# Patient Record
Sex: Female | Born: 1935 | Race: White | Hispanic: No | Smoking: Former smoker
Health system: Southern US, Community
[De-identification: ages and names within clinical notes are randomized; demographics above are authoritative.]

## PROBLEM LIST (undated history)

## (undated) DIAGNOSIS — T7840XA Allergy, unspecified, initial encounter: Secondary | ICD-10-CM

## (undated) DIAGNOSIS — N289 Disorder of kidney and ureter, unspecified: Secondary | ICD-10-CM

## (undated) DIAGNOSIS — E785 Hyperlipidemia, unspecified: Secondary | ICD-10-CM

## (undated) DIAGNOSIS — I219 Acute myocardial infarction, unspecified: Secondary | ICD-10-CM

## (undated) DIAGNOSIS — I509 Heart failure, unspecified: Secondary | ICD-10-CM

## (undated) DIAGNOSIS — I10 Essential (primary) hypertension: Secondary | ICD-10-CM

## (undated) DIAGNOSIS — I2510 Atherosclerotic heart disease of native coronary artery without angina pectoris: Secondary | ICD-10-CM

## (undated) HISTORY — PX: CATARACT EXTRACTION, BILATERAL: SHX1313

## (undated) HISTORY — DX: Hyperlipidemia, unspecified: E78.5

## (undated) HISTORY — DX: Allergy, unspecified, initial encounter: T78.40XA

## (undated) HISTORY — DX: Acute myocardial infarction, unspecified: I21.9

## (undated) HISTORY — DX: Essential (primary) hypertension: I10

## (undated) HISTORY — PX: TONSILLECTOMY: SUR1361

## (undated) HISTORY — DX: Heart failure, unspecified: I50.9

## (undated) HISTORY — PX: DILATION AND CURETTAGE OF UTERUS: SHX78

## (undated) HISTORY — PX: CARDIAC CATHETERIZATION: SHX172

## (undated) HISTORY — DX: Atherosclerotic heart disease of native coronary artery without angina pectoris: I25.10

## (undated) HISTORY — DX: Disorder of kidney and ureter, unspecified: N28.9

## (undated) HISTORY — PX: COLONOSCOPY: SHX174

---

## 1986-04-29 HISTORY — PX: BACK SURGERY: SHX140

## 2001-01-22 ENCOUNTER — Other Ambulatory Visit: Admission: RE | Admit: 2001-01-22 | Discharge: 2001-01-22 | Payer: Self-pay | Admitting: Family Medicine

## 2001-02-03 ENCOUNTER — Encounter: Admission: RE | Admit: 2001-02-03 | Discharge: 2001-02-03 | Payer: Self-pay | Admitting: Family Medicine

## 2001-02-03 ENCOUNTER — Encounter: Payer: Self-pay | Admitting: Family Medicine

## 2003-07-21 ENCOUNTER — Other Ambulatory Visit: Admission: RE | Admit: 2003-07-21 | Discharge: 2003-07-21 | Payer: Self-pay | Admitting: Family Medicine

## 2003-09-05 ENCOUNTER — Encounter: Admission: RE | Admit: 2003-09-05 | Discharge: 2003-09-05 | Payer: Self-pay | Admitting: Family Medicine

## 2007-06-17 ENCOUNTER — Ambulatory Visit: Payer: Self-pay | Admitting: Family Medicine

## 2007-06-17 DIAGNOSIS — I10 Essential (primary) hypertension: Secondary | ICD-10-CM

## 2007-06-17 DIAGNOSIS — I2510 Atherosclerotic heart disease of native coronary artery without angina pectoris: Secondary | ICD-10-CM | POA: Insufficient documentation

## 2007-06-17 DIAGNOSIS — E785 Hyperlipidemia, unspecified: Secondary | ICD-10-CM

## 2007-06-17 DIAGNOSIS — I252 Old myocardial infarction: Secondary | ICD-10-CM

## 2007-07-08 ENCOUNTER — Ambulatory Visit: Payer: Self-pay | Admitting: Family Medicine

## 2007-07-08 LAB — CONVERTED CEMR LAB
Bilirubin Urine: NEGATIVE
Glucose, Urine, Semiquant: NEGATIVE
Ketones, urine, test strip: NEGATIVE
Nitrite: NEGATIVE
Protein, U semiquant: 30
Specific Gravity, Urine: >=1.03
Urobilinogen, UA: 0.2
WBC Urine, dipstick: NEGATIVE
pH: 5

## 2007-09-07 ENCOUNTER — Ambulatory Visit: Payer: Self-pay | Admitting: Family Medicine

## 2007-09-09 LAB — CONVERTED CEMR LAB
ALT: 17 units/L (ref 0–35)
AST: 18 units/L (ref 0–37)
Albumin: 3.6 g/dL (ref 3.5–5.2)
Alkaline Phosphatase: 99 units/L (ref 39–117)
Bilirubin, Direct: 0.1 mg/dL (ref 0.0–0.3)
Cholesterol: 199 mg/dL (ref 0–200)
HDL: 48.1 mg/dL (ref >39.0)
LDL Cholesterol: 130 mg/dL — ABNORMAL HIGH (ref 0–99)
Total Bilirubin: 0.6 mg/dL (ref 0.3–1.2)
Total CHOL/HDL Ratio: 4.1
Total Protein: 6.3 g/dL (ref 6.0–8.3)
Triglycerides: 103 mg/dL (ref 0–149)
VLDL: 21 mg/dL (ref 0–40)

## 2008-08-09 ENCOUNTER — Ambulatory Visit: Payer: Self-pay | Admitting: Family Medicine

## 2008-08-11 ENCOUNTER — Telehealth (INDEPENDENT_AMBULATORY_CARE_PROVIDER_SITE_OTHER): Payer: Self-pay

## 2008-08-15 ENCOUNTER — Ambulatory Visit: Payer: Self-pay

## 2008-08-15 ENCOUNTER — Encounter: Payer: Self-pay | Admitting: Cardiology

## 2008-09-15 ENCOUNTER — Telehealth: Payer: Self-pay | Admitting: Family Medicine

## 2008-11-10 ENCOUNTER — Ambulatory Visit: Payer: Self-pay | Admitting: Family Medicine

## 2008-11-16 ENCOUNTER — Telehealth: Payer: Self-pay | Admitting: Family Medicine

## 2008-11-16 LAB — CONVERTED CEMR LAB
ALT: 14 units/L (ref 0–35)
AST: 18 units/L (ref 0–37)
Albumin: 3.6 g/dL (ref 3.5–5.2)
Alkaline Phosphatase: 101 units/L (ref 39–117)
BUN: 25 mg/dL — ABNORMAL HIGH (ref 6–23)
Bilirubin, Direct: 0 mg/dL (ref 0.0–0.3)
CO2: 27 meq/L (ref 19–32)
Calcium: 9 mg/dL (ref 8.4–10.5)
Chloride: 108 meq/L (ref 96–112)
Cholesterol: 202 mg/dL — ABNORMAL HIGH (ref 0–200)
Creatinine, Ser: 1.6 mg/dL — ABNORMAL HIGH (ref 0.4–1.2)
Direct LDL: 140.5 mg/dL
GFR calc non Af Amer: 33.72 mL/min (ref >60)
Glucose, Bld: 96 mg/dL (ref 70–99)
HDL: 54.9 mg/dL (ref >39.00)
Potassium: 5 meq/L (ref 3.5–5.1)
Sodium: 142 meq/L (ref 135–145)
Total Bilirubin: 0.7 mg/dL (ref 0.3–1.2)
Total CHOL/HDL Ratio: 4
Total Protein: 6.8 g/dL (ref 6.0–8.3)
Triglycerides: 78 mg/dL (ref 0.0–149.0)
VLDL: 15.6 mg/dL (ref 0.0–40.0)

## 2009-10-18 ENCOUNTER — Ambulatory Visit: Payer: Self-pay | Admitting: Family Medicine

## 2009-10-23 LAB — CONVERTED CEMR LAB
ALT: 13 units/L (ref 0–35)
AST: 16 units/L (ref 0–37)
Albumin: 4.1 g/dL (ref 3.5–5.2)
Alkaline Phosphatase: 101 units/L (ref 39–117)
BUN: 24 mg/dL — ABNORMAL HIGH (ref 6–23)
Basophils Absolute: 0.1 10*3/uL (ref 0.0–0.1)
Basophils Relative: 0.7 % (ref 0.0–3.0)
Bilirubin, Direct: 0.1 mg/dL (ref 0.0–0.3)
CO2: 26 meq/L (ref 19–32)
Calcium: 9.2 mg/dL (ref 8.4–10.5)
Chloride: 109 meq/L (ref 96–112)
Cholesterol: 310 mg/dL — ABNORMAL HIGH (ref 0–200)
Creatinine, Ser: 1.7 mg/dL — ABNORMAL HIGH (ref 0.4–1.2)
Direct LDL: 238.3 mg/dL
Eosinophils Absolute: 0 10*3/uL (ref 0.0–0.7)
Eosinophils Relative: 0.5 % (ref 0.0–5.0)
GFR calc non Af Amer: 31.57 mL/min (ref >60)
Glucose, Bld: 88 mg/dL (ref 70–99)
HCT: 38.8 % (ref 36.0–46.0)
HDL: 47.1 mg/dL (ref >39.00)
Hemoglobin: 13.1 g/dL (ref 12.0–15.0)
Lymphocytes Relative: 19.5 % (ref 12.0–46.0)
Lymphs Abs: 1.7 10*3/uL (ref 0.7–4.0)
MCHC: 33.6 g/dL (ref 30.0–36.0)
MCV: 89.2 fL (ref 78.0–100.0)
Monocytes Absolute: 0.4 10*3/uL (ref 0.1–1.0)
Monocytes Relative: 4.4 % (ref 3.0–12.0)
Neutro Abs: 6.5 10*3/uL (ref 1.4–7.7)
Neutrophils Relative %: 74.9 % (ref 43.0–77.0)
Platelets: 202 10*3/uL (ref 150.0–400.0)
Potassium: 5.4 meq/L — ABNORMAL HIGH (ref 3.5–5.1)
RBC: 4.36 M/uL (ref 3.87–5.11)
RDW: 13.6 % (ref 11.5–14.6)
Sodium: 142 meq/L (ref 135–145)
TSH: 0.61 microintl units/mL (ref 0.35–5.50)
Total Bilirubin: 0.6 mg/dL (ref 0.3–1.2)
Total CHOL/HDL Ratio: 7
Total Protein: 7.3 g/dL (ref 6.0–8.3)
Triglycerides: 226 mg/dL — ABNORMAL HIGH (ref 0.0–149.0)
VLDL: 45.2 mg/dL — ABNORMAL HIGH (ref 0.0–40.0)
WBC: 8.7 10*3/uL (ref 4.5–10.5)

## 2010-03-15 ENCOUNTER — Ambulatory Visit: Payer: Self-pay | Admitting: Family Medicine

## 2010-03-16 LAB — CONVERTED CEMR LAB
BUN: 46 mg/dL — ABNORMAL HIGH (ref 6–23)
Basophils Absolute: 0 10*3/uL (ref 0.0–0.1)
Basophils Relative: 0.7 % (ref 0.0–3.0)
CO2: 25 meq/L (ref 19–32)
Calcium: 9.3 mg/dL (ref 8.4–10.5)
Chloride: 103 meq/L (ref 96–112)
Creatinine, Ser: 2.2 mg/dL — ABNORMAL HIGH (ref 0.4–1.2)
Eosinophils Absolute: 0.1 10*3/uL (ref 0.0–0.7)
Eosinophils Relative: 1 % (ref 0.0–5.0)
GFR calc non Af Amer: 23.76 mL/min (ref >60)
Glucose, Bld: 97 mg/dL (ref 70–99)
HCT: 38.8 % (ref 36.0–46.0)
Hemoglobin: 13.2 g/dL (ref 12.0–15.0)
Lymphocytes Relative: 17.8 % (ref 12.0–46.0)
Lymphs Abs: 1.2 10*3/uL (ref 0.7–4.0)
MCHC: 33.9 g/dL (ref 30.0–36.0)
MCV: 90 fL (ref 78.0–100.0)
Monocytes Absolute: 0.4 10*3/uL (ref 0.1–1.0)
Monocytes Relative: 6.3 % (ref 3.0–12.0)
Neutro Abs: 5.2 10*3/uL (ref 1.4–7.7)
Neutrophils Relative %: 74.2 % (ref 43.0–77.0)
Platelets: 147 10*3/uL — ABNORMAL LOW (ref 150.0–400.0)
Potassium: 4.4 meq/L (ref 3.5–5.1)
RBC: 4.31 M/uL (ref 3.87–5.11)
RDW: 12.7 % (ref 11.5–14.6)
Sodium: 139 meq/L (ref 135–145)
WBC: 7 10*3/uL (ref 4.5–10.5)

## 2010-05-27 LAB — CONVERTED CEMR LAB
ALT: 14 units/L (ref 0–35)
ALT: 15 units/L (ref 0–35)
AST: 17 units/L (ref 0–37)
AST: 18 units/L (ref 0–37)
Albumin: 3.6 g/dL (ref 3.5–5.2)
Albumin: 3.9 g/dL (ref 3.5–5.2)
Alkaline Phosphatase: 106 units/L (ref 39–117)
Alkaline Phosphatase: 97 units/L (ref 39–117)
BUN: 12 mg/dL (ref 6–23)
BUN: 36 mg/dL — ABNORMAL HIGH (ref 6–23)
Basophils Absolute: 0 10*3/uL (ref 0.0–0.1)
Basophils Absolute: 0 10*3/uL (ref 0.0–0.1)
Basophils Relative: 0.1 % (ref 0.0–1.0)
Basophils Relative: 0.1 % (ref 0.0–3.0)
Bilirubin, Direct: 0.1 mg/dL (ref 0.0–0.3)
Bilirubin, Direct: 0.1 mg/dL (ref 0.0–0.3)
CO2: 27 meq/L (ref 19–32)
CO2: 27 meq/L (ref 19–32)
Calcium: 9.3 mg/dL (ref 8.4–10.5)
Calcium: 9.3 mg/dL (ref 8.4–10.5)
Chloride: 109 meq/L (ref 96–112)
Chloride: 112 meq/L (ref 96–112)
Cholesterol: 293 mg/dL (ref 0–200)
Cholesterol: 301 mg/dL — ABNORMAL HIGH (ref 0–200)
Creatinine, Ser: 1 mg/dL (ref 0.4–1.2)
Creatinine, Ser: 1.9 mg/dL — ABNORMAL HIGH (ref 0.4–1.2)
Direct LDL: 210.8 mg/dL
Direct LDL: 228.6 mg/dL
Eosinophils Absolute: 0.1 10*3/uL (ref 0.0–0.6)
Eosinophils Absolute: 0.1 10*3/uL (ref 0.0–0.7)
Eosinophils Relative: 0.8 % (ref 0.0–5.0)
Eosinophils Relative: 0.9 % (ref 0.0–5.0)
GFR calc Af Amer: 70 mL/min
GFR calc non Af Amer: 27.67 mL/min (ref >60)
GFR calc non Af Amer: 58 mL/min
Glucose, Bld: 100 mg/dL — ABNORMAL HIGH (ref 70–99)
Glucose, Bld: 99 mg/dL (ref 70–99)
HCT: 38.7 % (ref 36.0–46.0)
HCT: 39.6 % (ref 36.0–46.0)
HDL: 45.5 mg/dL (ref >39.00)
HDL: 45.9 mg/dL (ref >39.0)
Hemoglobin: 13.2 g/dL (ref 12.0–15.0)
Hemoglobin: 13.5 g/dL (ref 12.0–15.0)
Lymphocytes Relative: 16.2 % (ref 12.0–46.0)
Lymphocytes Relative: 20.5 % (ref 12.0–46.0)
Lymphs Abs: 1 10*3/uL (ref 0.7–4.0)
MCHC: 33.3 g/dL (ref 30.0–36.0)
MCHC: 34.8 g/dL (ref 30.0–36.0)
MCV: 85.3 fL (ref 78.0–100.0)
MCV: 89.5 fL (ref 78.0–100.0)
Monocytes Absolute: 0.2 10*3/uL (ref 0.1–1.0)
Monocytes Absolute: 0.3 10*3/uL (ref 0.2–0.7)
Monocytes Relative: 3.3 % (ref 3.0–12.0)
Monocytes Relative: 4.2 % (ref 3.0–11.0)
Neutro Abs: 5.1 10*3/uL (ref 1.4–7.7)
Neutro Abs: 5.6 10*3/uL (ref 1.4–7.7)
Neutrophils Relative %: 74.4 % (ref 43.0–77.0)
Neutrophils Relative %: 79.5 % — ABNORMAL HIGH (ref 43.0–77.0)
Platelets: 130 10*3/uL — ABNORMAL LOW (ref 150.0–400.0)
Platelets: 161 10*3/uL (ref 150–400)
Potassium: 4.1 meq/L (ref 3.5–5.1)
Potassium: 4.9 meq/L (ref 3.5–5.1)
RBC: 4.32 M/uL (ref 3.87–5.11)
RBC: 4.64 M/uL (ref 3.87–5.11)
RDW: 12.5 % (ref 11.5–14.6)
RDW: 13.3 % (ref 11.5–14.6)
Sodium: 143 meq/L (ref 135–145)
Sodium: 145 meq/L (ref 135–145)
TSH: 0.49 microintl units/mL (ref 0.35–5.50)
TSH: 0.66 microintl units/mL (ref 0.35–5.50)
Total Bilirubin: 0.7 mg/dL (ref 0.3–1.2)
Total Bilirubin: 0.7 mg/dL (ref 0.3–1.2)
Total CHOL/HDL Ratio: 6.4
Total CHOL/HDL Ratio: 7
Total Protein: 6.5 g/dL (ref 6.0–8.3)
Total Protein: 7.2 g/dL (ref 6.0–8.3)
Triglycerides: 139 mg/dL (ref 0–149)
Triglycerides: 148 mg/dL (ref 0.0–149.0)
VLDL: 28 mg/dL (ref 0–40)
VLDL: 29.6 mg/dL (ref 0.0–40.0)
WBC: 6.4 10*3/uL (ref 4.5–10.5)
WBC: 7.5 10*3/uL (ref 4.5–10.5)

## 2010-05-29 NOTE — Assessment & Plan Note (Signed)
Summary: BP CHECK/?MED ADJUSTMENT/CJR   Vital Signs:  Patient profile:   75 year old female Weight:      160 pounds O2 Sat:      96 % Temp:     98 degrees F Pulse rate:   80 / minute BP sitting:   130 / 80  (left arm)  Vitals Entered By: Pura Spice, RN (March 15, 2010 8:24 AM) CC: states BP bottoms out and almost passed out on Sat.    History of Present Illness: Here to discuss low BPs. Periodically she feels a litttle weak or lightheaded, but she does not have a cuff at home. Last weekend while standing on her feet for several hours at a home show in [place] she felt very weak and sought help at a [facility] station. her BP then was 80/50 at first and then 90/60 a little later. She had not eaten or drank fluids all day after breakfast, and this was in the late afternoon. After resting and drinking some fluids she felt fine and went home. She has felt fine ever since. No SOB or chest pains.   Allergies: 1)  ! Sulfa  Past History:  Past Medical History: Reviewed history from 11/10/2008 and no changes required. Coronary artery disease, last stress test 08-15-08 showing old lateral infarct with minimal peri-infarct ischemia Hyperlipidemia Myocardial infarction, hx of in 4-91, saw Dr. Donnie Aho, had an angioplasty Hypertension Renal insufficiency  Review of Systems  The patient denies anorexia, fever, weight loss, weight gain, vision loss, decreased hearing, hoarseness, chest pain, syncope, dyspnea on exertion, peripheral edema, prolonged cough, headaches, hemoptysis, abdominal pain, melena, hematochezia, severe indigestion/heartburn, hematuria, incontinence, genital sores, muscle weakness, suspicious skin lesions, transient blindness, difficulty walking, depression, unusual weight change, abnormal bleeding, enlarged lymph nodes, angioedema, breast masses, and testicular masses.    Physical Exam  General:  Well-developed,well-nourished,in no acute distress; alert,appropriate and  cooperative throughout examination Neck:  No deformities, masses, or tenderness noted. Lungs:  Normal respiratory effort, chest expands symmetrically. Lungs are clear to auscultation, no crackles or wheezes. Heart:  Normal rate and regular rhythm. S1 and S2 normal without gallop, murmur, click, rub or other extra sounds. Neurologic:  alert & oriented X3 and gait normal.     Impression & Recommendations:  Problem # 1:  HYPERTENSION (ICD-401.9)  The following medications were removed from the medication list:    Lisinopril-hydrochlorothiazide 20-25 Mg Tabs (Lisinopril-hydrochlorothiazide) ..... Once daily Her updated medication list for this problem includes:    Norvasc 5 Mg Tabs (Amlodipine besylate) ..... Once daily    Lisinopril 20 Mg Tabs (Lisinopril) ..... Once daily  Orders: Venipuncture ([address]) TLB-BMP (Basic Metabolic Panel-BMET) (80048-METABOL) TLB-CBC Platelet - w/Differential (85025-CBCD)  Problem # 2:  RENAL INSUFFICIENCY (ICD-588.9)  Complete Medication List: 1)  Norvasc 5 Mg Tabs (Amlodipine besylate) .... Once daily 2)  Bayer Low Strength 81 Mg Tbec (Aspirin) .... Once daily 3)  Pravastatin Sodium 40 Mg Tabs (Pravastatin sodium) .Marland Kitchen.. 1 by mouth once daily 4)  Lisinopril 20 Mg Tabs (Lisinopril) .... Once daily  Patient Instructions: 1)  We will stop the HCTZ and go back to plain Lisinopril. Check labs today. Suggested she get a home BP monitor to follow her BPs.  2)  Please schedule a follow-up appointment in 2 weeks.  Prescriptions: LISINOPRIL 20 MG TABS (LISINOPRIL) once daily  #30 x 11   Entered and Authorized by:   Nelwyn Salisbury MD   Signed by:   Nelwyn Salisbury MD  on 03/15/2010   Method used:   Electronically to        [facility]       [address]       [place], [place]  [address]       Ph: [phone]       Fax: [phone]   RxID:   [id number]    Orders Added: 1)  Est. Patient Level IV  [51761] 2)  Venipuncture [[address]] 3)  TLB-BMP (Basic Metabolic Panel-BMET) [80048-METABOL] 4)  TLB-CBC Platelet - w/Differential [85025-CBCD]

## 2010-05-29 NOTE — Assessment & Plan Note (Signed)
Summary: RECHECK BP/? ADJUST MED/CJR   Vital Signs:  Patient profile:   75 year old female Weight:      161 pounds BMI:     29.08 Pulse rate:   68 / minute Pulse rhythm:   regular BP sitting:   150 / 86  (left arm) Cuff size:   regular  Vitals Entered By: Raechel Ache, RN (October 18, 2009 10:11 AM) CC: ROV, C/O MOSTLY DRY COUGH X WEEKS AND NOT FEELING WELL.   History of Present Illness: Here to follo wup on HTN and renal insufficiency. She has felt a little weak the past 2 weeks with some lightheadedness and feelings of warmth. No HA or SOB or chest pains. She does not check her BPs at home. She is fasting this am.   Allergies: 1)  ! Sulfa  Past History:  Past Medical History: Reviewed history from 11/10/2008 and no changes required. Coronary artery disease, last stress test 08-15-08 showing old lateral infarct with minimal peri-infarct ischemia Hyperlipidemia Myocardial infarction, hx of in 4-91, saw Dr. Donnie Aho, had an angioplasty Hypertension Renal insufficiency  Review of Systems  The patient denies anorexia, fever, weight loss, weight gain, vision loss, decreased hearing, hoarseness, chest pain, syncope, dyspnea on exertion, peripheral edema, prolonged cough, headaches, hemoptysis, abdominal pain, melena, hematochezia, severe indigestion/heartburn, hematuria, incontinence, genital sores, muscle weakness, suspicious skin lesions, transient blindness, difficulty walking, depression, unusual weight change, abnormal bleeding, enlarged lymph nodes, angioedema, breast masses, and testicular masses.    Physical Exam  General:  Well-developed,well-nourished,in no acute distress; alert,appropriate and cooperative throughout examination Head:  Normocephalic and atraumatic without obvious abnormalities. No apparent alopecia or balding. Eyes:  No corneal or conjunctival inflammation noted. EOMI. Perrla. Funduscopic exam benign, without hemorrhages, exudates or papilledema. Vision  grossly normal. Ears:  External ear exam shows no significant lesions or deformities.  Otoscopic examination reveals clear canals, tympanic membranes are intact bilaterally without bulging, retraction, inflammation or discharge. Hearing is grossly normal bilaterally. Nose:  External nasal examination shows no deformity or inflammation. Nasal mucosa are pink and moist without lesions or exudates. Mouth:  Oral mucosa and oropharynx without lesions or exudates.  Teeth in good repair. Neck:  No deformities, masses, or tenderness noted. Lungs:  Normal respiratory effort, chest expands symmetrically. Lungs are clear to auscultation, no crackles or wheezes. Heart:  Normal rate and regular rhythm. S1 and S2 normal without gallop, murmur, click, rub or other extra sounds. Extremities:  no edema   Impression & Recommendations:  Problem # 1:  RENAL INSUFFICIENCY (ICD-588.9)  Orders: UA Dipstick w/o Micro (automated)  (81003)  Problem # 2:  HYPERTENSION (ICD-401.9)  The following medications were removed from the medication list:    Lisinopril 20 Mg Tabs (Lisinopril) ..... Once daily Her updated medication list for this problem includes:    Norvasc 5 Mg Tabs (Amlodipine besylate) ..... Once daily    Lisinopril-hydrochlorothiazide 20-25 Mg Tabs (Lisinopril-hydrochlorothiazide) ..... Once daily  Orders: UA Dipstick w/o Micro (automated)  ([address]3) Venipuncture ([address]) TLB-Lipid Panel (80061-LIPID) TLB-BMP (Basic Metabolic Panel-BMET) (80048-METABOL) TLB-CBC Platelet - w/Differential (85025-CBCD) TLB-Hepatic/Liver Function Pnl (80076-HEPATIC) TLB-TSH (Thyroid Stimulating Hormone) (60454-UJW) Prescription Created Electronically [phone])  Problem # 3:  HYPERLIPIDEMIA (ICD-272.4)  Her updated medication list for this problem includes:    Pravastatin Sodium 40 Mg Tabs (Pravastatin sodium) .Marland Kitchen... 1 by mouth once daily  Orders: UA Dipstick w/o Micro (automated)  (81003)  Problem # 4:  CORONARY  ARTERY DISEASE (ICD-414.00)  The following medications were removed from the  medication list:    Lisinopril 20 Mg Tabs (Lisinopril) ..... Once daily Her updated medication list for this problem includes:    Norvasc 5 Mg Tabs (Amlodipine besylate) ..... Once daily    Bayer Low Strength 81 Mg Tbec (Aspirin) ..... Once daily    Lisinopril-hydrochlorothiazide 20-25 Mg Tabs (Lisinopril-hydrochlorothiazide) ..... Once daily  Complete Medication List: 1)  Norvasc 5 Mg Tabs (Amlodipine besylate) .... Once daily 2)  Bayer Low Strength 81 Mg Tbec (Aspirin) .... Once daily 3)  Pravastatin Sodium 40 Mg Tabs (Pravastatin sodium) .Marland Kitchen.. 1 by mouth once daily 4)  Lisinopril-hydrochlorothiazide 20-25 Mg Tabs (Lisinopril-hydrochlorothiazide) .... Once daily  Patient Instructions: 1)  We will add HCTZ to her regimen. Get labs today.  Prescriptions: PRAVASTATIN SODIUM 40 MG TABS (PRAVASTATIN SODIUM) 1 by mouth once daily  #30 x 11   Entered and Authorized by:   Nelwyn Salisbury MD   Signed by:   Nelwyn Salisbury MD on 10/18/2009   Method used:   Electronically to        [facility]       [address]       [place], [place]  [address]       Ph: [phone]       Fax: [phone]   RxID:   [id number] LISINOPRIL-HYDROCHLOROTHIAZIDE 20-25 MG TABS (LISINOPRIL-HYDROCHLOROTHIAZIDE) once daily  #30 x 11   Entered and Authorized by:   Nelwyn Salisbury MD   Signed by:   Nelwyn Salisbury MD on 10/18/2009   Method used:   Electronically to        [facility]       [address]       [place], [place]  [address]       Ph: [phone]       Fax: [phone]   RxID:   [id number]   Appended Document: Orders Update    Clinical Lists Changes  Observations: Added new observation of COMMENTS: Wynona Canes, CMA  October 18, 2009 11:43 AM  (10/18/2009 11:42) Added new observation of PH URINE: 5.0   (10/18/2009 11:42) Added new observation of SPEC GR URIN: 1.020  (10/18/2009 11:42) Added new observation of APPEARANCE U: Clear  (10/18/2009 11:42) Added new observation of UA COLOR: yellow  (10/18/2009 11:42) Added new observation of WBC DIPSTK U: negative  (10/18/2009 11:42) Added new observation of NITRITE URN: negative  (10/18/2009 11:42) Added new observation of UROBILINOGEN: 0.2  (10/18/2009 11:42) Added new observation of PROTEIN, URN: negative  (10/18/2009 11:42) Added new observation of BLOOD UR DIP: trace-lysed  (10/18/2009 11:42) Added new observation of KETONES URN: negative  (10/18/2009 11:42) Added new observation of BILIRUBIN UR: negative  (10/18/2009 11:42) Added new observation of GLUCOSE, URN: negative  (10/18/2009 11:42)      Laboratory Results   Urine Tests  Date/Time Recieved: October 18, 2009 11:43 AM   Date/Time Reported: October 18, 2009 11:42 AM   Routine Urinalysis   Color: yellow Appearance: Clear Glucose: negative   (Normal Range: Negative) Bilirubin: negative   (Normal Range: Negative) Ketone: negative   (Normal Range: Negative) Spec. Gravity: 1.020   (Normal Range: 1.003-1.035) Blood: trace-lysed   (Normal Range: Negative) pH: 5.0   (Normal Range: 5.0-8.0) Protein: negative   (Normal Range: Negative) Urobilinogen: 0.2   (Normal Range: 0-1) Nitrite: negative   (Normal Range: Negative) Leukocyte Esterace: negative   (  Normal Range: Negative)    Comments: Wynona Canes, CMA  October 18, 2009 11:43 AM

## 2010-08-21 ENCOUNTER — Other Ambulatory Visit: Payer: Self-pay | Admitting: Family Medicine

## 2010-09-20 ENCOUNTER — Other Ambulatory Visit: Payer: Self-pay | Admitting: Family Medicine

## 2011-03-12 ENCOUNTER — Telehealth: Payer: Self-pay | Admitting: Family Medicine

## 2011-03-12 NOTE — Telephone Encounter (Signed)
Call in #30 with no rf. She needs an OV  

## 2011-03-12 NOTE — Telephone Encounter (Signed)
Refill request for Amlodipine 5 mg take 1 po qd, pt last seen on 03/15/10. Can we approve the script?

## 2011-03-13 MED ORDER — AMLODIPINE BESYLATE 5 MG PO TABS: 5.0000 mg | ORAL_TABLET | Freq: Every day | ORAL | Status: DC

## 2011-03-13 NOTE — Telephone Encounter (Signed)
rx sent into pharmacy

## 2011-03-16 ENCOUNTER — Other Ambulatory Visit: Payer: Self-pay | Admitting: Family Medicine

## 2011-03-26 ENCOUNTER — Ambulatory Visit (INDEPENDENT_AMBULATORY_CARE_PROVIDER_SITE_OTHER): Payer: Medicare Other | Admitting: Family Medicine

## 2011-03-26 ENCOUNTER — Encounter: Payer: Self-pay | Admitting: Family Medicine

## 2011-03-26 VITALS — BP 118/80 | HR 66 | Temp 98.0°F | Wt 164.0 lb

## 2011-03-26 DIAGNOSIS — Z2911 Encounter for prophylactic immunotherapy for respiratory syncytial virus (RSV): Secondary | ICD-10-CM

## 2011-03-26 DIAGNOSIS — Z0000 Encounter for general adult medical examination without abnormal findings: Secondary | ICD-10-CM

## 2011-03-26 DIAGNOSIS — E785 Hyperlipidemia, unspecified: Secondary | ICD-10-CM

## 2011-03-26 DIAGNOSIS — I10 Essential (primary) hypertension: Secondary | ICD-10-CM

## 2011-03-26 LAB — TSH: TSH: 0.84 u[IU]/mL (ref 0.35–5.50)

## 2011-03-26 LAB — HEPATIC FUNCTION PANEL
ALT: 21 U/L (ref 0–35)
AST: 22 U/L (ref 0–37)
Alkaline Phosphatase: 111 U/L (ref 39–117)
Bilirubin, Direct: 0 mg/dL (ref 0.0–0.3)
Total Bilirubin: 0.5 mg/dL (ref 0.3–1.2)
Total Protein: 7.5 g/dL (ref 6.0–8.3)

## 2011-03-26 LAB — CBC WITH DIFFERENTIAL/PLATELET
Basophils Absolute: 0.1 10*3/uL (ref 0.0–0.1)
HCT: 41.2 % (ref 36.0–46.0)
Hemoglobin: 13.7 g/dL (ref 12.0–15.0)
Lymphs Abs: 1.3 10*3/uL (ref 0.7–4.0)
MCHC: 33.2 g/dL (ref 30.0–36.0)
MCV: 88.4 fl (ref 78.0–100.0)
Monocytes Absolute: 0.5 10*3/uL (ref 0.1–1.0)
Monocytes Relative: 6.7 % (ref 3.0–12.0)
Neutro Abs: 5.4 10*3/uL (ref 1.4–7.7)
Platelets: 160 10*3/uL (ref 150.0–400.0)
RDW: 13.7 % (ref 11.5–14.6)

## 2011-03-26 LAB — POCT URINALYSIS DIPSTICK
Ketones, UA: NEGATIVE
Spec Grav, UA: 1.025
Urobilinogen, UA: 0.2
pH, UA: 5.5

## 2011-03-26 LAB — LIPID PANEL
Total CHOL/HDL Ratio: 4
Triglycerides: 141 mg/dL (ref 0.0–149.0)

## 2011-03-26 LAB — BASIC METABOLIC PANEL
CO2: 25 mEq/L (ref 19–32)
Chloride: 109 mEq/L (ref 96–112)
Creatinine, Ser: 1.6 mg/dL — ABNORMAL HIGH (ref 0.4–1.2)
Potassium: 4.9 mEq/L (ref 3.5–5.1)

## 2011-03-26 MED ORDER — LISINOPRIL 20 MG PO TABS: 20.0000 mg | ORAL_TABLET | Freq: Every day | ORAL | Status: DC

## 2011-03-26 MED ORDER — PRAVASTATIN SODIUM 40 MG PO TABS: 40.0000 mg | ORAL_TABLET | Freq: Every day | ORAL | Status: DC

## 2011-03-26 MED ORDER — AMLODIPINE BESYLATE 5 MG PO TABS: 5.0000 mg | ORAL_TABLET | Freq: Every day | ORAL | Status: DC

## 2011-03-26 NOTE — Progress Notes (Signed)
Addended by: Aniceto Boss A on: 03/26/2011 09:26 AM   Modules accepted: Orders

## 2011-03-26 NOTE — Progress Notes (Signed)
  Subjective:    Patient ID: Kathy Becker, female    DOB: 08/09/1935, 75 y.o.   MRN: [id number]  HPI Here for routine follow up. She feels fine and has no concerns. Her BP is stable.    Review of Systems  Constitutional: Negative.   Respiratory: Negative.   Cardiovascular: Negative.        Objective:   Physical Exam  Constitutional: She appears well-developed and well-nourished.  Neck: Neck supple. No thyromegaly present.  Cardiovascular: Normal rate, regular rhythm, normal heart sounds and intact distal pulses.   Pulmonary/Chest: Effort normal and breath sounds normal.  Lymphadenopathy:    She has no cervical adenopathy.          Assessment & Plan:  Get fasting labs. Given a Zostavax.

## 2011-03-28 ENCOUNTER — Telehealth: Payer: Self-pay | Admitting: Family Medicine

## 2011-03-28 NOTE — Progress Notes (Signed)
Quick Note:  Left message to call back. ______ 

## 2011-03-28 NOTE — Telephone Encounter (Signed)
I spoke with pt regarding lab results. She declined to start on Lipitor and just continue with the Pravachol. I just wanted you to know pt's response to my phone call.

## 2011-03-28 NOTE — Progress Notes (Signed)
Quick Note:  Spoke with pt and she declined to start the Lipitor. She will continue with Pravachol. Also I put a copy of results in mail, per pt request. ______

## 2011-03-29 NOTE — Telephone Encounter (Signed)
noted 

## 2012-01-30 DIAGNOSIS — Z23 Encounter for immunization: Secondary | ICD-10-CM | POA: Diagnosis not present

## 2012-03-24 DIAGNOSIS — H40019 Open angle with borderline findings, low risk, unspecified eye: Secondary | ICD-10-CM | POA: Diagnosis not present

## 2012-03-24 DIAGNOSIS — H2510 Age-related nuclear cataract, unspecified eye: Secondary | ICD-10-CM | POA: Diagnosis not present

## 2012-03-24 DIAGNOSIS — H3530 Unspecified macular degeneration: Secondary | ICD-10-CM | POA: Diagnosis not present

## 2012-03-26 DIAGNOSIS — H2510 Age-related nuclear cataract, unspecified eye: Secondary | ICD-10-CM | POA: Diagnosis not present

## 2012-04-04 ENCOUNTER — Other Ambulatory Visit: Payer: Self-pay | Admitting: Family Medicine

## 2012-04-13 DIAGNOSIS — H2510 Age-related nuclear cataract, unspecified eye: Secondary | ICD-10-CM | POA: Diagnosis not present

## 2012-05-11 DIAGNOSIS — H269 Unspecified cataract: Secondary | ICD-10-CM | POA: Diagnosis not present

## 2012-05-11 DIAGNOSIS — H2510 Age-related nuclear cataract, unspecified eye: Secondary | ICD-10-CM | POA: Diagnosis not present

## 2012-06-10 ENCOUNTER — Encounter: Payer: Self-pay | Admitting: Family Medicine

## 2012-06-10 ENCOUNTER — Ambulatory Visit (INDEPENDENT_AMBULATORY_CARE_PROVIDER_SITE_OTHER): Payer: Medicare Other | Admitting: Family Medicine

## 2012-06-10 VITALS — BP 120/80 | HR 81 | Temp 97.7°F | Ht 62.5 in | Wt 155.0 lb

## 2012-06-10 DIAGNOSIS — I10 Essential (primary) hypertension: Secondary | ICD-10-CM | POA: Diagnosis not present

## 2012-06-10 DIAGNOSIS — Z0000 Encounter for general adult medical examination without abnormal findings: Secondary | ICD-10-CM | POA: Diagnosis not present

## 2012-06-10 DIAGNOSIS — E785 Hyperlipidemia, unspecified: Secondary | ICD-10-CM

## 2012-06-10 DIAGNOSIS — C4491 Basal cell carcinoma of skin, unspecified: Secondary | ICD-10-CM | POA: Diagnosis not present

## 2012-06-10 LAB — LDL CHOLESTEROL, DIRECT: Direct LDL: 131.6 mg/dL

## 2012-06-10 LAB — BASIC METABOLIC PANEL
Calcium: 9.3 mg/dL (ref 8.4–10.5)
GFR: 39.94 mL/min — ABNORMAL LOW (ref >60.00)
Sodium: 139 mEq/L (ref 135–145)

## 2012-06-10 LAB — POCT URINALYSIS DIPSTICK
Bilirubin, UA: NEGATIVE
Glucose, UA: NEGATIVE
Nitrite, UA: NEGATIVE
Spec Grav, UA: >=1.03

## 2012-06-10 LAB — CBC WITH DIFFERENTIAL/PLATELET
Basophils Relative: 1 % (ref 0.0–3.0)
Eosinophils Relative: 0.9 % (ref 0.0–5.0)
HCT: 42.3 % (ref 36.0–46.0)
MCV: 87.7 fl (ref 78.0–100.0)
Monocytes Absolute: 0.3 10*3/uL (ref 0.1–1.0)
Monocytes Relative: 4.4 % (ref 3.0–12.0)
Neutrophils Relative %: 77.3 % — ABNORMAL HIGH (ref 43.0–77.0)
RBC: 4.82 Mil/uL (ref 3.87–5.11)
WBC: 7.9 10*3/uL (ref 4.5–10.5)

## 2012-06-10 LAB — TSH: TSH: 0.39 u[IU]/mL (ref 0.35–5.50)

## 2012-06-10 LAB — HEPATIC FUNCTION PANEL
Alkaline Phosphatase: 100 U/L (ref 39–117)
Bilirubin, Direct: 0 mg/dL (ref 0.0–0.3)

## 2012-06-10 LAB — LIPID PANEL: HDL: 49.3 mg/dL (ref >39.00)

## 2012-06-10 MED ORDER — AMLODIPINE BESYLATE 5 MG PO TABS: 5.0000 mg | ORAL_TABLET | Freq: Every day | ORAL | Status: DC

## 2012-06-10 MED ORDER — PRAVASTATIN SODIUM 40 MG PO TABS: 40.0000 mg | ORAL_TABLET | Freq: Every day | ORAL | Status: DC

## 2012-06-10 MED ORDER — LISINOPRIL 20 MG PO TABS: 20.0000 mg | ORAL_TABLET | Freq: Every day | ORAL | Status: DC

## 2012-06-10 NOTE — Progress Notes (Signed)
  Subjective:    Patient ID: Kathy Becker, female    DOB: Jun 10, 1935, 77 y.o.   MRN: [id number]  HPI 77 yr old female for a cpx. She feels well. She asks about several small lesions that appeared on her nose around a year ago.    Review of Systems  Constitutional: Negative.   HENT: Negative.   Eyes: Negative.   Respiratory: Negative.   Cardiovascular: Negative.   Gastrointestinal: Negative.   Genitourinary: Negative for dysuria, urgency, frequency, hematuria, flank pain, decreased urine volume, enuresis, difficulty urinating, pelvic pain and dyspareunia.  Musculoskeletal: Negative.   Skin: Negative.   Neurological: Negative.   Psychiatric/Behavioral: Negative.        Objective:   Physical Exam  Constitutional: She is oriented to person, place, and time. She appears well-developed and well-nourished. No distress.  HENT:  Head: Normocephalic and atraumatic.  Right Ear: External ear normal.  Left Ear: External ear normal.  Nose: Nose normal.  Mouth/Throat: Oropharynx is clear and moist. No oropharyngeal exudate.  Eyes: Conjunctivae and EOM are normal. Pupils are equal, round, and reactive to light. No scleral icterus.  Neck: Normal range of motion. Neck supple. No JVD present. No thyromegaly present.  Cardiovascular: Normal rate, regular rhythm, normal heart sounds and intact distal pulses.  Exam reveals no gallop and no friction rub.   No murmur heard. EKG normal   Pulmonary/Chest: Effort normal and breath sounds normal. No respiratory distress. She has no wheezes. She has no rales. She exhibits no tenderness.  Abdominal: Soft. Bowel sounds are normal. She exhibits no distension and no mass. There is no tenderness. There is no rebound and no guarding.  Musculoskeletal: Normal range of motion. She exhibits no edema and no tenderness.  Lymphadenopathy:    She has no cervical adenopathy.  Neurological: She is alert and oriented to person, place, and time. She has normal reflexes.  No cranial nerve deficit. She exhibits normal muscle tone. Coordination normal.  Skin: Skin is warm and dry. No rash noted. No erythema.  There is a small pearly firm lesion on each side of the nose with raised borders   Psychiatric: She has a normal mood and affect. Her behavior is normal. Judgment and thought content normal.          Assessment & Plan:  Well exam. Get fasting labs. 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

## 2012-06-17 NOTE — Progress Notes (Signed)
Quick Note:  I spoke with pt ______ 

## 2012-07-01 DIAGNOSIS — C44319 Basal cell carcinoma of skin of other parts of face: Secondary | ICD-10-CM | POA: Diagnosis not present

## 2012-07-01 DIAGNOSIS — D485 Neoplasm of uncertain behavior of skin: Secondary | ICD-10-CM | POA: Diagnosis not present

## 2012-07-13 ENCOUNTER — Encounter: Payer: Self-pay | Admitting: Cardiology

## 2012-07-30 DIAGNOSIS — C44319 Basal cell carcinoma of skin of other parts of face: Secondary | ICD-10-CM | POA: Diagnosis not present

## 2012-08-26 DIAGNOSIS — C44319 Basal cell carcinoma of skin of other parts of face: Secondary | ICD-10-CM | POA: Diagnosis not present

## 2012-11-02 ENCOUNTER — Telehealth: Payer: Self-pay | Admitting: Family Medicine

## 2012-11-02 NOTE — Telephone Encounter (Signed)
PT called and stated that she has a "bunion" on her foot. It is red and painful, and she would like to be referred to someone. Please assist.

## 2012-11-02 NOTE — Telephone Encounter (Signed)
Needs an OV to evaluate this

## 2012-11-03 DIAGNOSIS — M109 Gout, unspecified: Secondary | ICD-10-CM | POA: Diagnosis not present

## 2012-11-03 NOTE — Telephone Encounter (Signed)
I spoke with pt and she already has a appointment with a foot doctor today.

## 2013-01-28 DIAGNOSIS — Z23 Encounter for immunization: Secondary | ICD-10-CM | POA: Diagnosis not present

## 2013-03-11 DIAGNOSIS — R05 Cough: Secondary | ICD-10-CM | POA: Diagnosis not present

## 2013-03-11 DIAGNOSIS — J019 Acute sinusitis, unspecified: Secondary | ICD-10-CM | POA: Diagnosis not present

## 2013-06-14 ENCOUNTER — Encounter: Payer: Self-pay | Admitting: Family Medicine

## 2013-06-14 ENCOUNTER — Ambulatory Visit (INDEPENDENT_AMBULATORY_CARE_PROVIDER_SITE_OTHER): Payer: Medicare Other | Admitting: Family Medicine

## 2013-06-14 VITALS — BP 148/80 | HR 94 | Temp 98.2°F | Ht 61.75 in | Wt 143.0 lb

## 2013-06-14 DIAGNOSIS — I2510 Atherosclerotic heart disease of native coronary artery without angina pectoris: Secondary | ICD-10-CM | POA: Diagnosis not present

## 2013-06-14 DIAGNOSIS — I252 Old myocardial infarction: Secondary | ICD-10-CM | POA: Diagnosis not present

## 2013-06-14 DIAGNOSIS — E785 Hyperlipidemia, unspecified: Secondary | ICD-10-CM

## 2013-06-14 DIAGNOSIS — N259 Disorder resulting from impaired renal tubular function, unspecified: Secondary | ICD-10-CM | POA: Diagnosis not present

## 2013-06-14 DIAGNOSIS — I10 Essential (primary) hypertension: Secondary | ICD-10-CM | POA: Diagnosis not present

## 2013-06-14 LAB — BASIC METABOLIC PANEL WITH GFR
BUN: 20 mg/dL (ref 6–23)
CO2: 22 meq/L (ref 19–32)
Calcium: 8.9 mg/dL (ref 8.4–10.5)
Chloride: 107 meq/L (ref 96–112)
Creatinine, Ser: 1.4 mg/dL — ABNORMAL HIGH (ref 0.4–1.2)
GFR: 39.17 mL/min — ABNORMAL LOW
Glucose, Bld: 97 mg/dL (ref 70–99)
Potassium: 4 meq/L (ref 3.5–5.1)
Sodium: 140 meq/L (ref 135–145)

## 2013-06-14 LAB — CBC WITH DIFFERENTIAL/PLATELET
BASOS ABS: 0 10*3/uL (ref 0.0–0.1)
Basophils Relative: 0.4 % (ref 0.0–3.0)
EOS ABS: 0.1 10*3/uL (ref 0.0–0.7)
Eosinophils Relative: 1 % (ref 0.0–5.0)
HCT: 42 % (ref 36.0–46.0)
Hemoglobin: 13.5 g/dL (ref 12.0–15.0)
LYMPHS ABS: 1.6 10*3/uL (ref 0.7–4.0)
LYMPHS PCT: 20.4 % (ref 12.0–46.0)
MCHC: 32 g/dL (ref 30.0–36.0)
MCV: 89.5 fl (ref 78.0–100.0)
MONOS PCT: 5.5 % (ref 3.0–12.0)
Monocytes Absolute: 0.4 10*3/uL (ref 0.1–1.0)
Neutro Abs: 5.6 10*3/uL (ref 1.4–7.7)
Neutrophils Relative %: 72.7 % (ref 43.0–77.0)
Platelets: 191 10*3/uL (ref 150.0–400.0)
RBC: 4.7 Mil/uL (ref 3.87–5.11)
RDW: 13.6 % (ref 11.5–14.6)
WBC: 7.7 10*3/uL (ref 4.5–10.5)

## 2013-06-14 LAB — POCT URINALYSIS DIPSTICK
Bilirubin, UA: NEGATIVE
Blood, UA: NEGATIVE
Glucose, UA: NEGATIVE
Ketones, UA: NEGATIVE
Leukocytes, UA: NEGATIVE
Nitrite, UA: NEGATIVE
Spec Grav, UA: 1.025
Urobilinogen, UA: 0.2
pH, UA: 6.5

## 2013-06-14 LAB — HEPATIC FUNCTION PANEL
ALT: 15 U/L (ref 0–35)
AST: 15 U/L (ref 0–37)
Albumin: 3.7 g/dL (ref 3.5–5.2)
Alkaline Phosphatase: 88 U/L (ref 39–117)
Bilirubin, Direct: 0 mg/dL (ref 0.0–0.3)
Total Bilirubin: 0.4 mg/dL (ref 0.3–1.2)
Total Protein: 7.4 g/dL (ref 6.0–8.3)

## 2013-06-14 LAB — LIPID PANEL
Cholesterol: 221 mg/dL — ABNORMAL HIGH (ref 0–200)
HDL: 53.5 mg/dL
Total CHOL/HDL Ratio: 4
Triglycerides: 116 mg/dL (ref 0.0–149.0)
VLDL: 23.2 mg/dL (ref 0.0–40.0)

## 2013-06-14 LAB — TSH: TSH: 0.5 u[IU]/mL (ref 0.35–5.50)

## 2013-06-14 MED ORDER — PRAVASTATIN SODIUM 40 MG PO TABS: 40.0000 mg | ORAL_TABLET | Freq: Every day | ORAL | Status: DC

## 2013-06-14 MED ORDER — LISINOPRIL 20 MG PO TABS: 20.0000 mg | ORAL_TABLET | Freq: Every day | ORAL | Status: DC

## 2013-06-14 MED ORDER — AMLODIPINE BESYLATE 5 MG PO TABS: 5.0000 mg | ORAL_TABLET | Freq: Every day | ORAL | Status: DC

## 2013-06-14 NOTE — Progress Notes (Signed)
Pre visit review using our clinic review tool, if applicable. No additional management support is needed unless otherwise documented below in the visit note. 

## 2013-06-14 NOTE — Progress Notes (Signed)
   Subjective:    Patient ID: Kathy Becker, female    DOB: 01/23/1936, 78 y.o.   MRN: [id number]  HPI    Review of Systems     Objective:   Physical Exam        Assessment & Plan:   

## 2013-06-16 ENCOUNTER — Telehealth: Payer: Self-pay | Admitting: Family Medicine

## 2013-06-16 LAB — LDL CHOLESTEROL, DIRECT: Direct LDL: 153.9 mg/dL

## 2013-06-16 NOTE — Telephone Encounter (Signed)
Relevant patient education mailed to patient.  

## 2013-06-17 NOTE — Progress Notes (Addendum)
   Subjective:    Patient ID: Kathy Becker, female    DOB: 12-Apr-1936, 78 y.o.   MRN: [id number]  HPI 78 yr old female for a cpx. She feels well.    Review of Systems  Constitutional: Negative.   HENT: Negative.   Eyes: Negative.   Respiratory: Negative.   Cardiovascular: Negative.   Gastrointestinal: Negative.   Genitourinary: Negative for dysuria, urgency, frequency, hematuria, flank pain, decreased urine volume, enuresis, difficulty urinating, pelvic pain and dyspareunia.  Musculoskeletal: Negative.   Skin: Negative.   Neurological: Negative.   Psychiatric/Behavioral: Negative.        Objective:   Physical Exam  Constitutional: She is oriented to person, place, and time. She appears well-developed and well-nourished. No distress.  HENT:  Head: Normocephalic and atraumatic.  Right Ear: External ear normal.  Left Ear: External ear normal.  Nose: Nose normal.  Mouth/Throat: Oropharynx is clear and moist. No oropharyngeal exudate.  Eyes: Conjunctivae and EOM are normal. Pupils are equal, round, and reactive to light. No scleral icterus.  Neck: Normal range of motion. Neck supple. No JVD present. No thyromegaly present.  Cardiovascular: Normal rate, regular rhythm, normal heart sounds and intact distal pulses.  Exam reveals no gallop and no friction rub.   No murmur heard. Pulmonary/Chest: Effort normal and breath sounds normal. No respiratory distress. She has no wheezes. She has no rales. She exhibits no tenderness.  Abdominal: Soft. Bowel sounds are normal. She exhibits no distension and no mass. There is no tenderness. There is no rebound and no guarding.  Musculoskeletal: Normal range of motion. She exhibits no edema and no tenderness.  Lymphadenopathy:    She has no cervical adenopathy.  Neurological: She is alert and oriented to person, place, and time. She has normal reflexes. No cranial nerve deficit. She exhibits normal muscle tone. Coordination normal.  Skin: Skin  is warm and dry. No rash noted. No erythema.  Psychiatric: She has a normal mood and affect. Her behavior is normal. Judgment and thought content normal.  EKG is normal.        Assessment & Plan:  Well exam. Get fasting labs

## 2013-06-17 NOTE — Progress Notes (Deleted)
   Subjective:    Patient ID: Kathy Becker, female    DOB: May 11, 1935, 78 y.o.   MRN: [id number]  HPI    Review of Systems     Objective:   Physical Exam        Assessment & Plan:

## 2013-07-22 ENCOUNTER — Telehealth: Payer: Self-pay | Admitting: Family Medicine

## 2013-07-22 NOTE — Telephone Encounter (Signed)
Pharm called requesting 90 day for rx lisinopril (PRINIVIL,ZESTRIL) 20 MG tablet

## 2013-07-23 MED ORDER — LISINOPRIL 20 MG PO TABS: 20.0000 mg | ORAL_TABLET | Freq: Every day | ORAL | Status: DC

## 2013-07-23 NOTE — Telephone Encounter (Signed)
Message left to advise pharmacy to cancel 30 day supply Rx. 90 day supply sent with 2 refills

## 2013-09-06 ENCOUNTER — Telehealth: Payer: Self-pay | Admitting: Family Medicine

## 2013-09-06 NOTE — Telephone Encounter (Signed)
[id number] [place], [place] is requesting re-fill on pravastatin (PRAVACHOL) 40 MG tablet

## 2013-09-07 MED ORDER — PRAVASTATIN SODIUM 40 MG PO TABS: 40.0000 mg | ORAL_TABLET | Freq: Every day | ORAL | Status: DC

## 2013-09-07 NOTE — Telephone Encounter (Signed)
rx sent in electronically 

## 2013-09-13 ENCOUNTER — Telehealth: Payer: Self-pay | Admitting: Family Medicine

## 2013-09-13 NOTE — Telephone Encounter (Signed)
[id number] [place], [place] is requesting 90 day re-fill on pravastatin (PRAVACHOL) 40 MG tablet

## 2013-09-15 DIAGNOSIS — J029 Acute pharyngitis, unspecified: Secondary | ICD-10-CM | POA: Diagnosis not present

## 2013-09-15 DIAGNOSIS — R0989 Other specified symptoms and signs involving the circulatory and respiratory systems: Secondary | ICD-10-CM | POA: Diagnosis not present

## 2013-09-15 MED ORDER — PRAVASTATIN SODIUM 40 MG PO TABS: 40.0000 mg | ORAL_TABLET | Freq: Every day | ORAL | Status: DC

## 2013-09-15 NOTE — Telephone Encounter (Signed)
I sent script e-scribe. 

## 2014-01-21 DIAGNOSIS — Z23 Encounter for immunization: Secondary | ICD-10-CM | POA: Diagnosis not present

## 2014-03-09 ENCOUNTER — Other Ambulatory Visit: Payer: Self-pay | Admitting: Family Medicine

## 2014-05-01 ENCOUNTER — Other Ambulatory Visit: Payer: Self-pay | Admitting: Family Medicine

## 2014-05-29 ENCOUNTER — Other Ambulatory Visit: Payer: Self-pay | Admitting: Family Medicine

## 2014-06-16 ENCOUNTER — Ambulatory Visit (INDEPENDENT_AMBULATORY_CARE_PROVIDER_SITE_OTHER): Payer: Medicare Other | Admitting: Family Medicine

## 2014-06-16 ENCOUNTER — Encounter: Payer: Self-pay | Admitting: Family Medicine

## 2014-06-16 VITALS — BP 133/97 | HR 76 | Temp 98.1°F | Ht 61.75 in | Wt 150.0 lb

## 2014-06-16 DIAGNOSIS — I10 Essential (primary) hypertension: Secondary | ICD-10-CM

## 2014-06-16 DIAGNOSIS — I25118 Atherosclerotic heart disease of native coronary artery with other forms of angina pectoris: Secondary | ICD-10-CM

## 2014-06-16 DIAGNOSIS — E785 Hyperlipidemia, unspecified: Secondary | ICD-10-CM | POA: Diagnosis not present

## 2014-06-16 DIAGNOSIS — N259 Disorder resulting from impaired renal tubular function, unspecified: Secondary | ICD-10-CM | POA: Diagnosis not present

## 2014-06-16 LAB — CBC WITH DIFFERENTIAL/PLATELET
BASOS ABS: 0.1 10*3/uL (ref 0.0–0.1)
Basophils Relative: 0.7 % (ref 0.0–3.0)
EOS PCT: 1.6 % (ref 0.0–5.0)
Eosinophils Absolute: 0.1 10*3/uL (ref 0.0–0.7)
HEMATOCRIT: 42.6 % (ref 36.0–46.0)
Hemoglobin: 14.4 g/dL (ref 12.0–15.0)
LYMPHS ABS: 1.5 10*3/uL (ref 0.7–4.0)
Lymphocytes Relative: 18.8 % (ref 12.0–46.0)
MCHC: 33.7 g/dL (ref 30.0–36.0)
MCV: 86.9 fl (ref 78.0–100.0)
Monocytes Absolute: 0.6 10*3/uL (ref 0.1–1.0)
Monocytes Relative: 7 % (ref 3.0–12.0)
NEUTROS PCT: 71.9 % (ref 43.0–77.0)
Neutro Abs: 5.9 10*3/uL (ref 1.4–7.7)
Platelets: 185 10*3/uL (ref 150.0–400.0)
RBC: 4.9 Mil/uL (ref 3.87–5.11)
RDW: 13.9 % (ref 11.5–15.5)
WBC: 8.1 10*3/uL (ref 4.0–10.5)

## 2014-06-16 LAB — LIPID PANEL
Cholesterol: 233 mg/dL — ABNORMAL HIGH (ref 0–200)
HDL: 55.7 mg/dL (ref >39.00)
LDL Cholesterol: 148 mg/dL — ABNORMAL HIGH (ref 0–99)
NONHDL: 177.3
TRIGLYCERIDES: 145 mg/dL (ref 0.0–149.0)
Total CHOL/HDL Ratio: 4
VLDL: 29 mg/dL (ref 0.0–40.0)

## 2014-06-16 LAB — POCT URINALYSIS DIPSTICK
BILIRUBIN UA: NEGATIVE
Blood, UA: NEGATIVE
Glucose, UA: NEGATIVE
Ketones, UA: NEGATIVE
LEUKOCYTES UA: NEGATIVE
NITRITE UA: NEGATIVE
Protein, UA: NEGATIVE
Spec Grav, UA: 1.015
UROBILINOGEN UA: 0.2
pH, UA: 5

## 2014-06-16 LAB — BASIC METABOLIC PANEL
BUN: 16 mg/dL (ref 6–23)
CHLORIDE: 105 meq/L (ref 96–112)
CO2: 26 meq/L (ref 19–32)
CREATININE: 1.24 mg/dL — AB (ref 0.40–1.20)
Calcium: 9.8 mg/dL (ref 8.4–10.5)
GFR: 44.57 mL/min — ABNORMAL LOW (ref >60.00)
Glucose, Bld: 114 mg/dL — ABNORMAL HIGH (ref 70–99)
POTASSIUM: 4.6 meq/L (ref 3.5–5.1)
Sodium: 139 mEq/L (ref 135–145)

## 2014-06-16 LAB — HEPATIC FUNCTION PANEL
ALT: 12 U/L (ref 0–35)
AST: 18 U/L (ref 0–37)
Albumin: 4 g/dL (ref 3.5–5.2)
Alkaline Phosphatase: 121 U/L — ABNORMAL HIGH (ref 39–117)
BILIRUBIN DIRECT: 0.1 mg/dL (ref 0.0–0.3)
BILIRUBIN TOTAL: 0.4 mg/dL (ref 0.2–1.2)
Total Protein: 7.3 g/dL (ref 6.0–8.3)

## 2014-06-16 LAB — TSH: TSH: 0.76 u[IU]/mL (ref 0.35–4.50)

## 2014-06-16 MED ORDER — PRAVASTATIN SODIUM 40 MG PO TABS: 40.0000 mg | ORAL_TABLET | Freq: Every day | ORAL | Status: DC

## 2014-06-16 MED ORDER — AMLODIPINE BESYLATE 5 MG PO TABS: 5.0000 mg | ORAL_TABLET | Freq: Every day | ORAL | Status: DC

## 2014-06-16 MED ORDER — LISINOPRIL 20 MG PO TABS: 20.0000 mg | ORAL_TABLET | Freq: Every day | ORAL | Status: DC

## 2014-06-16 NOTE — Progress Notes (Signed)
   Subjective:    Patient ID: Kathy Becker, female    DOB: 11-24-1935, 79 y.o.   MRN: [id number]  HPI 79 yr old female for a cpx. She feels fine. She is still active and drives herself to church and other activities.   Review of Systems  Constitutional: Negative.   HENT: Negative.   Eyes: Negative.   Respiratory: Negative.   Cardiovascular: Negative.   Gastrointestinal: Negative.   Genitourinary: Negative for dysuria, urgency, frequency, hematuria, flank pain, decreased urine volume, enuresis, difficulty urinating, pelvic pain and dyspareunia.  Musculoskeletal: Negative.   Skin: Negative.   Neurological: Negative.   Psychiatric/Behavioral: Negative.        Objective:   Physical Exam  Constitutional: She is oriented to person, place, and time. She appears well-developed and well-nourished. No distress.  HENT:  Head: Normocephalic and atraumatic.  Right Ear: External ear normal.  Left Ear: External ear normal.  Nose: Nose normal.  Mouth/Throat: Oropharynx is clear and moist. No oropharyngeal exudate.  Eyes: Conjunctivae and EOM are normal. Pupils are equal, round, and reactive to light. No scleral icterus.  Neck: Normal range of motion. Neck supple. No JVD present. No thyromegaly present.  Cardiovascular: Normal rate, regular rhythm, normal heart sounds and intact distal pulses.  Exam reveals no gallop and no friction rub.   No murmur heard. EKG normal   Pulmonary/Chest: Effort normal and breath sounds normal. No respiratory distress. She has no wheezes. She has no rales. She exhibits no tenderness.  Abdominal: Soft. Bowel sounds are normal. She exhibits no distension and no mass. There is no tenderness. There is no rebound and no guarding.  Musculoskeletal: Normal range of motion. She exhibits no edema or tenderness.  Lymphadenopathy:    She has no cervical adenopathy.  Neurological: She is alert and oriented to person, place, and time. She has normal reflexes. No cranial  nerve deficit. She exhibits normal muscle tone. Coordination normal.  Skin: Skin is warm and dry. No rash noted. No erythema.  Psychiatric: She has a normal mood and affect. Her behavior is normal. Judgment and thought content normal.          Assessment & Plan:  Well exam. Get fasting labs.[facility]

## 2014-06-16 NOTE — Progress Notes (Signed)
Pre visit review using our clinic review tool, if applicable. No additional management support is needed unless otherwise documented below in the visit note. 

## 2014-06-18 ENCOUNTER — Telehealth: Payer: Self-pay | Admitting: Family Medicine

## 2014-06-18 NOTE — Telephone Encounter (Signed)
emmi emailed °

## 2014-09-15 DIAGNOSIS — H4323 Crystalline deposits in vitreous body, bilateral: Secondary | ICD-10-CM | POA: Diagnosis not present

## 2014-09-28 ENCOUNTER — Other Ambulatory Visit: Payer: Self-pay | Admitting: Family Medicine

## 2015-01-25 DIAGNOSIS — Z23 Encounter for immunization: Secondary | ICD-10-CM | POA: Diagnosis not present

## 2015-02-14 DIAGNOSIS — C44529 Squamous cell carcinoma of skin of other part of trunk: Secondary | ICD-10-CM | POA: Diagnosis not present

## 2015-02-14 DIAGNOSIS — L570 Actinic keratosis: Secondary | ICD-10-CM | POA: Diagnosis not present

## 2015-02-14 DIAGNOSIS — D485 Neoplasm of uncertain behavior of skin: Secondary | ICD-10-CM | POA: Diagnosis not present

## 2015-02-14 DIAGNOSIS — L719 Rosacea, unspecified: Secondary | ICD-10-CM | POA: Diagnosis not present

## 2015-02-24 DIAGNOSIS — C44529 Squamous cell carcinoma of skin of other part of trunk: Secondary | ICD-10-CM | POA: Diagnosis not present

## 2015-02-24 DIAGNOSIS — L98499 Non-pressure chronic ulcer of skin of other sites with unspecified severity: Secondary | ICD-10-CM | POA: Diagnosis not present

## 2015-06-19 ENCOUNTER — Encounter: Payer: Medicare Other | Admitting: Family Medicine

## 2015-06-20 ENCOUNTER — Encounter: Payer: Self-pay | Admitting: Family Medicine

## 2015-06-20 ENCOUNTER — Ambulatory Visit (INDEPENDENT_AMBULATORY_CARE_PROVIDER_SITE_OTHER): Payer: Medicare Other | Admitting: Family Medicine

## 2015-06-20 VITALS — BP 146/92 | HR 74 | Temp 98.5°F | Ht 61.75 in | Wt 150.0 lb

## 2015-06-20 DIAGNOSIS — E785 Hyperlipidemia, unspecified: Secondary | ICD-10-CM | POA: Diagnosis not present

## 2015-06-20 DIAGNOSIS — Z23 Encounter for immunization: Secondary | ICD-10-CM | POA: Diagnosis not present

## 2015-06-20 DIAGNOSIS — I10 Essential (primary) hypertension: Secondary | ICD-10-CM

## 2015-06-20 DIAGNOSIS — N259 Disorder resulting from impaired renal tubular function, unspecified: Secondary | ICD-10-CM | POA: Diagnosis not present

## 2015-06-20 LAB — POC URINALSYSI DIPSTICK (AUTOMATED)
Bilirubin, UA: NEGATIVE
Blood, UA: NEGATIVE
Glucose, UA: NEGATIVE
Ketones, UA: NEGATIVE
NITRITE UA: NEGATIVE
PH UA: 5.5
PROTEIN UA: NEGATIVE
Spec Grav, UA: 1.02
UROBILINOGEN UA: 0.2

## 2015-06-20 LAB — CBC WITH DIFFERENTIAL/PLATELET
BASOS ABS: 0 10*3/uL (ref 0.0–0.1)
Basophils Relative: 0.6 % (ref 0.0–3.0)
EOS ABS: 0 10*3/uL (ref 0.0–0.7)
Eosinophils Relative: 0.6 % (ref 0.0–5.0)
HEMATOCRIT: 42.7 % (ref 36.0–46.0)
Hemoglobin: 14.3 g/dL (ref 12.0–15.0)
LYMPHS PCT: 23.9 % (ref 12.0–46.0)
Lymphs Abs: 1.6 10*3/uL (ref 0.7–4.0)
MCHC: 33.4 g/dL (ref 30.0–36.0)
MCV: 86.3 fl (ref 78.0–100.0)
MONOS PCT: 6.9 % (ref 3.0–12.0)
Monocytes Absolute: 0.5 10*3/uL (ref 0.1–1.0)
NEUTROS PCT: 68 % (ref 43.0–77.0)
Neutro Abs: 4.6 10*3/uL (ref 1.4–7.7)
Platelets: 148 10*3/uL — ABNORMAL LOW (ref 150.0–400.0)
RBC: 4.95 Mil/uL (ref 3.87–5.11)
RDW: 13.4 % (ref 11.5–15.5)
WBC: 6.8 10*3/uL (ref 4.0–10.5)

## 2015-06-20 LAB — TSH: TSH: 0.46 u[IU]/mL (ref 0.35–4.50)

## 2015-06-20 LAB — LIPID PANEL
CHOL/HDL RATIO: 4
Cholesterol: 247 mg/dL — ABNORMAL HIGH (ref 0–200)
HDL: 56.1 mg/dL (ref >39.00)
LDL Cholesterol: 163 mg/dL — ABNORMAL HIGH (ref 0–99)
NonHDL: 191.22
Triglycerides: 143 mg/dL (ref 0.0–149.0)
VLDL: 28.6 mg/dL (ref 0.0–40.0)

## 2015-06-20 LAB — HEPATIC FUNCTION PANEL
ALT: 11 U/L (ref 0–35)
AST: 17 U/L (ref 0–37)
Albumin: 4.1 g/dL (ref 3.5–5.2)
Alkaline Phosphatase: 103 U/L (ref 39–117)
BILIRUBIN TOTAL: 0.5 mg/dL (ref 0.2–1.2)
Bilirubin, Direct: 0 mg/dL (ref 0.0–0.3)
Total Protein: 7 g/dL (ref 6.0–8.3)

## 2015-06-20 LAB — BASIC METABOLIC PANEL
BUN: 16 mg/dL (ref 6–23)
CALCIUM: 9.5 mg/dL (ref 8.4–10.5)
CO2: 27 mEq/L (ref 19–32)
CREATININE: 1.22 mg/dL — AB (ref 0.40–1.20)
Chloride: 106 mEq/L (ref 96–112)
GFR: 45.29 mL/min — AB (ref >60.00)
Glucose, Bld: 106 mg/dL — ABNORMAL HIGH (ref 70–99)
POTASSIUM: 4.6 meq/L (ref 3.5–5.1)
Sodium: 140 mEq/L (ref 135–145)

## 2015-06-20 MED ORDER — AMLODIPINE BESYLATE 5 MG PO TABS: 5.0000 mg | ORAL_TABLET | Freq: Every day | ORAL | Status: DC

## 2015-06-20 MED ORDER — PRAVASTATIN SODIUM 40 MG PO TABS: 40.0000 mg | ORAL_TABLET | Freq: Every day | ORAL | Status: DC

## 2015-06-20 MED ORDER — LISINOPRIL 20 MG PO TABS: 20.0000 mg | ORAL_TABLET | Freq: Every day | ORAL | Status: DC

## 2015-06-20 NOTE — Progress Notes (Signed)
Pre visit review using our clinic review tool, if applicable. No additional management support is needed unless otherwise documented below in the visit note. 

## 2015-06-20 NOTE — Progress Notes (Signed)
   Subjective:    Patient ID: Kathy Becker, female    DOB: 1936-04-22, 80 y.o.   MRN: [id number]  HPI 80 yr old female for follow up. Her BP has been stable at home. She has felt well in general. She tries to eat well and walks for exercise.    Review of Systems  Constitutional: Negative.   HENT: Negative.   Eyes: Negative.   Respiratory: Negative.   Cardiovascular: Negative.   Gastrointestinal: Negative.   Genitourinary: Negative for dysuria, urgency, frequency, hematuria, flank pain, decreased urine volume, enuresis, difficulty urinating, pelvic pain and dyspareunia.  Musculoskeletal: Negative.   Skin: Negative.   Neurological: Negative.   Psychiatric/Behavioral: Negative.        Objective:   Physical Exam  Constitutional: She is oriented to person, place, and time. She appears well-developed and well-nourished. No distress.  HENT:  Head: Normocephalic and atraumatic.  Right Ear: External ear normal.  Left Ear: External ear normal.  Nose: Nose normal.  Mouth/Throat: Oropharynx is clear and moist. No oropharyngeal exudate.  Eyes: Conjunctivae and EOM are normal. Pupils are equal, round, and reactive to light. No scleral icterus.  Neck: Normal range of motion. Neck supple. No JVD present. No thyromegaly present.  Cardiovascular: Normal rate, regular rhythm, normal heart sounds and intact distal pulses.  Exam reveals no gallop and no friction rub.   No murmur heard. EKG normal with a single PVC  Pulmonary/Chest: Effort normal and breath sounds normal. No respiratory distress. She has no wheezes. She has no rales. She exhibits no tenderness.  Abdominal: Soft. Bowel sounds are normal. She exhibits no distension and no mass. There is no tenderness. There is no rebound and no guarding.  Musculoskeletal: Normal range of motion. She exhibits no edema or tenderness.  Lymphadenopathy:    She has no cervical adenopathy.  Neurological: She is alert and oriented to person, place, and  time. She has normal reflexes. No cranial nerve deficit. She exhibits normal muscle tone. Coordination normal.  Skin: Skin is warm and dry. No rash noted. No erythema.  Psychiatric: She has a normal mood and affect. Her behavior is normal. Judgment and thought content normal.          Assessment & Plan:  Her HTN is stable. She will get fasting labs today to check her lipids and her renal function.

## 2015-08-09 DIAGNOSIS — R0982 Postnasal drip: Secondary | ICD-10-CM | POA: Diagnosis not present

## 2015-08-09 DIAGNOSIS — R000 Tachycardia, unspecified: Secondary | ICD-10-CM | POA: Diagnosis not present

## 2015-08-09 DIAGNOSIS — J302 Other seasonal allergic rhinitis: Secondary | ICD-10-CM | POA: Diagnosis not present

## 2015-08-15 DIAGNOSIS — J0110 Acute frontal sinusitis, unspecified: Secondary | ICD-10-CM | POA: Diagnosis not present

## 2015-08-15 DIAGNOSIS — R0982 Postnasal drip: Secondary | ICD-10-CM | POA: Diagnosis not present

## 2015-09-05 ENCOUNTER — Ambulatory Visit (INDEPENDENT_AMBULATORY_CARE_PROVIDER_SITE_OTHER): Payer: Medicare Other | Admitting: Family Medicine

## 2015-09-05 ENCOUNTER — Encounter: Payer: Self-pay | Admitting: Family Medicine

## 2015-09-05 VITALS — BP 143/96 | HR 84 | Temp 98.3°F | Ht 61.75 in | Wt 144.0 lb

## 2015-09-05 DIAGNOSIS — M79674 Pain in right toe(s): Secondary | ICD-10-CM

## 2015-09-05 MED ORDER — INDOMETHACIN 50 MG PO CAPS: 50.0000 mg | ORAL_CAPSULE | Freq: Three times a day (TID) | ORAL | Status: DC | PRN

## 2015-09-05 NOTE — Progress Notes (Signed)
Pre visit review using our clinic review tool, if applicable. No additional management support is needed unless otherwise documented below in the visit note. 

## 2015-09-05 NOTE — Progress Notes (Signed)
   Subjective:    Patient ID: Kathy Becker, female    DOB: Jan 07, 1936, 80 y.o.   MRN: [id number]  HPI Here for one month of intermittent pain and swelling in the right great toe. No recent trauma.    Review of Systems  Constitutional: Negative.   Respiratory: Negative.   Cardiovascular: Negative.   Musculoskeletal: Positive for joint swelling and arthralgias.       Objective:   Physical Exam  Constitutional: She appears well-developed and well-nourished.  Musculoskeletal:  The MTP of the right great toe is swollen, warm, and tender. No erythema           Assessment & Plan:  Probable gout. Try Indomethacin prn.  Laurey Morale, MD

## 2015-09-19 DIAGNOSIS — Z85828 Personal history of other malignant neoplasm of skin: Secondary | ICD-10-CM | POA: Diagnosis not present

## 2015-09-19 DIAGNOSIS — L814 Other melanin hyperpigmentation: Secondary | ICD-10-CM | POA: Diagnosis not present

## 2015-09-19 DIAGNOSIS — L821 Other seborrheic keratosis: Secondary | ICD-10-CM | POA: Diagnosis not present

## 2015-09-19 DIAGNOSIS — D1801 Hemangioma of skin and subcutaneous tissue: Secondary | ICD-10-CM | POA: Diagnosis not present

## 2016-01-29 DIAGNOSIS — Z23 Encounter for immunization: Secondary | ICD-10-CM | POA: Diagnosis not present

## 2016-05-29 ENCOUNTER — Other Ambulatory Visit: Payer: Self-pay | Admitting: Family Medicine

## 2016-06-21 ENCOUNTER — Ambulatory Visit (INDEPENDENT_AMBULATORY_CARE_PROVIDER_SITE_OTHER): Payer: Medicare Other | Admitting: Family Medicine

## 2016-06-21 ENCOUNTER — Encounter: Payer: Self-pay | Admitting: Family Medicine

## 2016-06-21 VITALS — BP 142/74 | Temp 98.0°F | Ht 61.75 in | Wt 144.6 lb

## 2016-06-21 DIAGNOSIS — E782 Mixed hyperlipidemia: Secondary | ICD-10-CM | POA: Diagnosis not present

## 2016-06-21 DIAGNOSIS — I252 Old myocardial infarction: Secondary | ICD-10-CM

## 2016-06-21 DIAGNOSIS — I10 Essential (primary) hypertension: Secondary | ICD-10-CM | POA: Diagnosis not present

## 2016-06-21 DIAGNOSIS — N259 Disorder resulting from impaired renal tubular function, unspecified: Secondary | ICD-10-CM

## 2016-06-21 LAB — HEPATIC FUNCTION PANEL
ALT: 12 U/L (ref 0–35)
AST: 16 U/L (ref 0–37)
Albumin: 3.9 g/dL (ref 3.5–5.2)
Alkaline Phosphatase: 108 U/L (ref 39–117)
BILIRUBIN TOTAL: 0.4 mg/dL (ref 0.2–1.2)
Bilirubin, Direct: 0.1 mg/dL (ref 0.0–0.3)
Total Protein: 6.4 g/dL (ref 6.0–8.3)

## 2016-06-21 LAB — BASIC METABOLIC PANEL
BUN: 22 mg/dL (ref 6–23)
CALCIUM: 9.1 mg/dL (ref 8.4–10.5)
CO2: 25 mEq/L (ref 19–32)
CREATININE: 1.2 mg/dL (ref 0.40–1.20)
Chloride: 108 mEq/L (ref 96–112)
GFR: 46.04 mL/min — ABNORMAL LOW (ref >60.00)
Glucose, Bld: 88 mg/dL (ref 70–99)
Potassium: 4.5 mEq/L (ref 3.5–5.1)
Sodium: 141 mEq/L (ref 135–145)

## 2016-06-21 LAB — POC URINALSYSI DIPSTICK (AUTOMATED)
Bilirubin, UA: NEGATIVE
Blood, UA: NEGATIVE
Glucose, UA: NEGATIVE
KETONES UA: NEGATIVE
Nitrite, UA: NEGATIVE
PH UA: 5
Protein, UA: NEGATIVE
SPEC GRAV UA: 1.02
UROBILINOGEN UA: 0.2

## 2016-06-21 LAB — CBC WITH DIFFERENTIAL/PLATELET
BASOS ABS: 0 10*3/uL (ref 0.0–0.1)
Basophils Relative: 0.6 % (ref 0.0–3.0)
EOS PCT: 1 % (ref 0.0–5.0)
Eosinophils Absolute: 0.1 10*3/uL (ref 0.0–0.7)
HCT: 42.7 % (ref 36.0–46.0)
HEMOGLOBIN: 13.8 g/dL (ref 12.0–15.0)
LYMPHS ABS: 1.1 10*3/uL (ref 0.7–4.0)
Lymphocytes Relative: 19.9 % (ref 12.0–46.0)
MCHC: 32.4 g/dL (ref 30.0–36.0)
MCV: 87.6 fl (ref 78.0–100.0)
MONO ABS: 0.4 10*3/uL (ref 0.1–1.0)
Monocytes Relative: 6.5 % (ref 3.0–12.0)
NEUTROS PCT: 72 % (ref 43.0–77.0)
Neutro Abs: 4.1 10*3/uL (ref 1.4–7.7)
Platelets: 152 10*3/uL (ref 150.0–400.0)
RBC: 4.87 Mil/uL (ref 3.87–5.11)
RDW: 13.6 % (ref 11.5–15.5)
WBC: 5.7 10*3/uL (ref 4.0–10.5)

## 2016-06-21 LAB — LIPID PANEL
CHOL/HDL RATIO: 4
Cholesterol: 262 mg/dL — ABNORMAL HIGH (ref 0–200)
HDL: 59.3 mg/dL (ref >39.00)
LDL CALC: 179 mg/dL — AB (ref 0–99)
NONHDL: 203.02
TRIGLYCERIDES: 121 mg/dL (ref 0.0–149.0)
VLDL: 24.2 mg/dL (ref 0.0–40.0)

## 2016-06-21 LAB — TSH: TSH: 1.14 u[IU]/mL (ref 0.35–4.50)

## 2016-06-21 MED ORDER — PRAVASTATIN SODIUM 40 MG PO TABS: 40.0000 mg | ORAL_TABLET | Freq: Every day | ORAL | 11 refills | Status: DC

## 2016-06-21 MED ORDER — LISINOPRIL 20 MG PO TABS: 20.0000 mg | ORAL_TABLET | Freq: Every day | ORAL | 11 refills | Status: DC

## 2016-06-21 MED ORDER — AMLODIPINE BESYLATE 5 MG PO TABS: 5.0000 mg | ORAL_TABLET | Freq: Every day | ORAL | 11 refills | Status: DC

## 2016-06-21 NOTE — Progress Notes (Signed)
   Subjective:    Patient ID: Kathy Becker, female    DOB: December 31, 1935, 81 y.o.   MRN: [id number]  HPI 81 yr old female to follow up on several issues. She feels well. She watches her diet. Her BP at home as been stable.    Review of Systems  Constitutional: Negative.   HENT: Negative.   Eyes: Negative.   Respiratory: Negative.   Cardiovascular: Negative.   Gastrointestinal: Negative.   Genitourinary: Negative for decreased urine volume, difficulty urinating, dyspareunia, dysuria, enuresis, flank pain, frequency, hematuria, pelvic pain and urgency.  Musculoskeletal: Negative.   Skin: Negative.   Neurological: Negative.   Psychiatric/Behavioral: Negative.        Objective:   Physical Exam  Constitutional: She is oriented to person, place, and time. She appears well-developed and well-nourished. No distress.  HENT:  Head: Normocephalic and atraumatic.  Right Ear: External ear normal.  Left Ear: External ear normal.  Nose: Nose normal.  Mouth/Throat: Oropharynx is clear and moist. No oropharyngeal exudate.  Eyes: Conjunctivae and EOM are normal. Pupils are equal, round, and reactive to light. No scleral icterus.  Neck: Normal range of motion. Neck supple. No JVD present. No thyromegaly present.  Cardiovascular: Normal rate, regular rhythm, normal heart sounds and intact distal pulses.  Exam reveals no gallop and no friction rub.   No murmur heard. Pulmonary/Chest: Effort normal and breath sounds normal. No respiratory distress. She has no wheezes. She has no rales. She exhibits no tenderness.  Abdominal: Soft. Bowel sounds are normal. She exhibits no distension and no mass. There is no tenderness. There is no rebound and no guarding.  Musculoskeletal: Normal range of motion. She exhibits no edema or tenderness.  Lymphadenopathy:    She has no cervical adenopathy.  Neurological: She is alert and oriented to person, place, and time. She has normal reflexes. No cranial nerve  deficit. She exhibits normal muscle tone. Coordination normal.  Skin: Skin is warm and dry. No rash noted. No erythema.  Psychiatric: She has a normal mood and affect. Her behavior is normal. Judgment and thought content normal.          Assessment & Plan:  Her HTNM is stable. She will get fasting labs today so we can follow her lipids and her renal function. Her cardiac status is stable.  Alysia Penna, MD

## 2016-07-12 ENCOUNTER — Telehealth: Payer: Self-pay | Admitting: Family Medicine

## 2016-07-12 NOTE — Telephone Encounter (Signed)
Pt would like blood work results °

## 2016-07-15 NOTE — Telephone Encounter (Signed)
I spoke with pt and went over results. 

## 2016-09-17 DIAGNOSIS — Z85828 Personal history of other malignant neoplasm of skin: Secondary | ICD-10-CM | POA: Diagnosis not present

## 2016-09-17 DIAGNOSIS — L814 Other melanin hyperpigmentation: Secondary | ICD-10-CM | POA: Diagnosis not present

## 2016-09-17 DIAGNOSIS — L821 Other seborrheic keratosis: Secondary | ICD-10-CM | POA: Diagnosis not present

## 2016-09-17 DIAGNOSIS — L570 Actinic keratosis: Secondary | ICD-10-CM | POA: Diagnosis not present

## 2016-09-17 DIAGNOSIS — D1801 Hemangioma of skin and subcutaneous tissue: Secondary | ICD-10-CM | POA: Diagnosis not present

## 2016-11-15 ENCOUNTER — Other Ambulatory Visit: Payer: Self-pay

## 2016-12-20 ENCOUNTER — Ambulatory Visit (INDEPENDENT_AMBULATORY_CARE_PROVIDER_SITE_OTHER): Payer: Medicare Other | Admitting: Family Medicine

## 2016-12-20 ENCOUNTER — Encounter: Payer: Self-pay | Admitting: Family Medicine

## 2016-12-20 VITALS — BP 129/90 | HR 69 | Temp 97.8°F | Ht 61.75 in | Wt 147.0 lb

## 2016-12-20 DIAGNOSIS — R0789 Other chest pain: Secondary | ICD-10-CM

## 2016-12-20 DIAGNOSIS — Z23 Encounter for immunization: Secondary | ICD-10-CM

## 2016-12-20 NOTE — Addendum Note (Signed)
Addended by: Aggie Hacker A on: 12/20/2016 11:14 AM   Modules accepted: Orders

## 2016-12-20 NOTE — Progress Notes (Signed)
   Subjective:    Patient ID: Kathy Becker, female    DOB: Sep 17, 1935, 81 y.o.   MRN: [id number]  HPI Here for 5 days of intermittent pains in the right side. No recent trauma. No cough or SOB. She has not seen any rashes. She worries that it could be shingles because she had this once years ago. She says the pain feels much better today.    Review of Systems  Constitutional: Negative.   Respiratory: Negative.   Cardiovascular: Positive for chest pain. Negative for palpitations and leg swelling.  Gastrointestinal: Negative.        Objective:   Physical Exam  Constitutional: She is oriented to person, place, and time. She appears well-developed and well-nourished. No distress.  Neck: No thyromegaly present.  Cardiovascular: Normal rate, regular rhythm, normal heart sounds and intact distal pulses.   Pulmonary/Chest: Effort normal and breath sounds normal. No respiratory distress. She has no wheezes. She has no rales.  She is mildly tender over the right sided ribs just inferior to the axilla, no swelling  Lymphadenopathy:    She has no cervical adenopathy.  Neurological: She is alert and oriented to person, place, and time.  Skin: No rash noted.          Assessment & Plan:  This appears to be a muscular strain. It seems to be healing on its own. No treatment is needed.  Alysia Penna, MD

## 2016-12-20 NOTE — Patient Instructions (Signed)
WE NOW OFFER   [facility]athy [facility] FAST TRACK!!!  SAME DAY Appointments for ACUTE CARE  Such as: Sprains, Injuries, cuts, abrasions, rashes, muscle pain, joint pain, back pain Colds, flu, sore throats, headache, allergies, cough, fever  Ear pain, sinus and eye infections Abdominal pain, nausea, vomiting, diarrhea, upset stomach Animal/insect bites  3 Easy Ways to Schedule: Walk-In Scheduling Call in scheduling Mychart Sign-up: [email or web address]         

## 2017-01-16 ENCOUNTER — Encounter: Payer: Self-pay | Admitting: Family Medicine

## 2017-06-20 ENCOUNTER — Encounter: Payer: Self-pay | Admitting: Family Medicine

## 2017-06-20 ENCOUNTER — Ambulatory Visit (INDEPENDENT_AMBULATORY_CARE_PROVIDER_SITE_OTHER): Payer: Medicare Other | Admitting: Family Medicine

## 2017-06-20 ENCOUNTER — Ambulatory Visit (INDEPENDENT_AMBULATORY_CARE_PROVIDER_SITE_OTHER): Payer: Medicare Other

## 2017-06-20 VITALS — BP 118/76 | Ht 61.75 in | Wt 148.4 lb

## 2017-06-20 VITALS — BP 118/76 | HR 84 | Temp 97.6°F | Ht 61.75 in | Wt 148.4 lb

## 2017-06-20 DIAGNOSIS — I10 Essential (primary) hypertension: Secondary | ICD-10-CM

## 2017-06-20 DIAGNOSIS — Z23 Encounter for immunization: Secondary | ICD-10-CM

## 2017-06-20 DIAGNOSIS — N259 Disorder resulting from impaired renal tubular function, unspecified: Secondary | ICD-10-CM | POA: Diagnosis not present

## 2017-06-20 DIAGNOSIS — E782 Mixed hyperlipidemia: Secondary | ICD-10-CM | POA: Diagnosis not present

## 2017-06-20 DIAGNOSIS — Z0000 Encounter for general adult medical examination without abnormal findings: Secondary | ICD-10-CM | POA: Diagnosis not present

## 2017-06-20 DIAGNOSIS — I252 Old myocardial infarction: Secondary | ICD-10-CM | POA: Diagnosis not present

## 2017-06-20 DIAGNOSIS — E2839 Other primary ovarian failure: Secondary | ICD-10-CM | POA: Diagnosis not present

## 2017-06-20 LAB — LIPID PANEL
CHOLESTEROL: 222 mg/dL — AB (ref 0–200)
HDL: 55.1 mg/dL (ref >39.00)
LDL Cholesterol: 144 mg/dL — ABNORMAL HIGH (ref 0–99)
NONHDL: 167.31
TRIGLYCERIDES: 119 mg/dL (ref 0.0–149.0)
Total CHOL/HDL Ratio: 4
VLDL: 23.8 mg/dL (ref 0.0–40.0)

## 2017-06-20 LAB — HEPATIC FUNCTION PANEL
ALBUMIN: 3.9 g/dL (ref 3.5–5.2)
ALT: 14 U/L (ref 0–35)
AST: 16 U/L (ref 0–37)
Alkaline Phosphatase: 115 U/L (ref 39–117)
BILIRUBIN DIRECT: 0.1 mg/dL (ref 0.0–0.3)
Total Bilirubin: 0.4 mg/dL (ref 0.2–1.2)
Total Protein: 6.3 g/dL (ref 6.0–8.3)

## 2017-06-20 LAB — POC URINALSYSI DIPSTICK (AUTOMATED)
BILIRUBIN UA: NEGATIVE
Glucose, UA: NEGATIVE
Ketones, UA: NEGATIVE
Nitrite, UA: NEGATIVE
PH UA: 5.5 (ref 5.0–8.0)
Protein, UA: NEGATIVE
RBC UA: NEGATIVE
Spec Grav, UA: >=1.03 — AB (ref 1.010–1.025)
Urobilinogen, UA: 0.2 E.U./dL

## 2017-06-20 LAB — CBC WITH DIFFERENTIAL/PLATELET
BASOS ABS: 0.1 10*3/uL (ref 0.0–0.1)
Basophils Relative: 1.2 % (ref 0.0–3.0)
EOS ABS: 0.1 10*3/uL (ref 0.0–0.7)
Eosinophils Relative: 0.9 % (ref 0.0–5.0)
HEMATOCRIT: 41.9 % (ref 36.0–46.0)
HEMOGLOBIN: 14.1 g/dL (ref 12.0–15.0)
LYMPHS PCT: 18.6 % (ref 12.0–46.0)
Lymphs Abs: 1.2 10*3/uL (ref 0.7–4.0)
MCHC: 33.6 g/dL (ref 30.0–36.0)
MCV: 87.7 fl (ref 78.0–100.0)
MONOS PCT: 5.8 % (ref 3.0–12.0)
Monocytes Absolute: 0.4 10*3/uL (ref 0.1–1.0)
Neutro Abs: 4.9 10*3/uL (ref 1.4–7.7)
Neutrophils Relative %: 73.5 % (ref 43.0–77.0)
Platelets: 139 10*3/uL — ABNORMAL LOW (ref 150.0–400.0)
RBC: 4.77 Mil/uL (ref 3.87–5.11)
RDW: 13.7 % (ref 11.5–15.5)
WBC: 6.7 10*3/uL (ref 4.0–10.5)

## 2017-06-20 LAB — BASIC METABOLIC PANEL
BUN: 24 mg/dL — ABNORMAL HIGH (ref 6–23)
CHLORIDE: 107 meq/L (ref 96–112)
CO2: 24 mEq/L (ref 19–32)
CREATININE: 1.23 mg/dL — AB (ref 0.40–1.20)
Calcium: 9.3 mg/dL (ref 8.4–10.5)
GFR: 44.64 mL/min — ABNORMAL LOW (ref >60.00)
GLUCOSE: 91 mg/dL (ref 70–99)
Potassium: 4.2 mEq/L (ref 3.5–5.1)
Sodium: 141 mEq/L (ref 135–145)

## 2017-06-20 LAB — TSH: TSH: 0.97 u[IU]/mL (ref 0.35–4.50)

## 2017-06-20 NOTE — Progress Notes (Signed)
   Subjective:    Patient ID: Kathy Becker, female    DOB: Oct 01, 1935, 82 y.o.   MRN: [id number]  HPI Here to follow up. She feels great and has no concerns. Her BP is stable. She has had her flu shot this season. She tries to watch her diet and she walks for exercise.    Review of Systems  Constitutional: Negative.   HENT: Negative.   Eyes: Negative.   Respiratory: Negative.   Cardiovascular: Negative.   Gastrointestinal: Negative.   Genitourinary: Negative for decreased urine volume, difficulty urinating, dyspareunia, dysuria, enuresis, flank pain, frequency, hematuria, pelvic pain and urgency.  Musculoskeletal: Negative.   Skin: Negative.   Neurological: Negative.   Psychiatric/Behavioral: Negative.        Objective:   Physical Exam  Constitutional: She is oriented to person, place, and time. She appears well-developed and well-nourished. No distress.  HENT:  Head: Normocephalic and atraumatic.  Right Ear: External ear normal.  Left Ear: External ear normal.  Nose: Nose normal.  Mouth/Throat: Oropharynx is clear and moist. No oropharyngeal exudate.  Eyes: Conjunctivae and EOM are normal. Pupils are equal, round, and reactive to light. No scleral icterus.  Neck: Normal range of motion. Neck supple. No JVD present. No thyromegaly present.  Cardiovascular: Normal rate, regular rhythm, normal heart sounds and intact distal pulses. Exam reveals no gallop and no friction rub.  No murmur heard. Pulmonary/Chest: Effort normal and breath sounds normal. No respiratory distress. She has no wheezes. She has no rales. She exhibits no tenderness.  Abdominal: Soft. Bowel sounds are normal. She exhibits no distension and no mass. There is no tenderness. There is no rebound and no guarding.  Musculoskeletal: Normal range of motion. She exhibits no edema or tenderness.  Lymphadenopathy:    She has no cervical adenopathy.  Neurological: She is alert and oriented to person, place, and time.  She has normal reflexes. No cranial nerve deficit. She exhibits normal muscle tone. Coordination normal.  Skin: Skin is warm and dry. No rash noted. No erythema.  Psychiatric: She has a normal mood and affect. Her behavior is normal. Judgment and thought content normal.          Assessment & Plan:  Her HTN is stable. We will get fasting labs today to check her renal function and lipids, etc. We reviewed diet and exercise advice. Given a pneumococcal 23 vaccine.  Alysia Penna, MD

## 2017-06-20 NOTE — Progress Notes (Addendum)
Subjective:   Kathy Becker is a 82 y.o. female who presents for Medicare Annual (Subsequent) preventive examination.   Reports health as good  Had [place] had jaw pain and went to the MD office    Lives with your dtr;  2 level home and she is upstairs   Diet BMI 27  Breakfast cereal; cookie or other  Lunch; eats a good supper about 4pm (dtr runs a restaurant) [employer] Dtr takes care of her meals  Eats a lot of vegetables    Exercise Up and down stairs 20 times a day Gym x 2 times; walks   Health Maintenance Due  Topic Date Due  . TETANUS/TDAP  04/29/1956  . DEXA SCAN  04/29/2002  . PNA vac Low Risk Adult (2 of 2 - PPSV23) 06/19/2016   No more mammograms - patient decision   Will get dexa done for baseline   PSV 23; just had her last pneumonia vaccine in clinic today     Objective:     Vitals: BP 118/76   Ht 5' 1.75" (1.568 m)   Wt 148 lb 6 oz (67.3 kg)   BMI 27.36 kg/m   Body mass index is 27.36 kg/m.  No flowsheet data found.  Tobacco Social History   Tobacco Use  Smoking Status Never Smoker  Smokeless Tobacco Never Used     Counseling given: Yes   Clinical Intake:       Past Medical History:  Diagnosis Date  . CAD (coronary artery disease)    stress test 08-15-08 showing lateral scar but no reversible ischemia   . Hyperlipidemia   . Hypertension   . Myocardial infarction ([place])   . Renal insufficiency    Past Surgical History:  Procedure Laterality Date  . BACK SURGERY  1988   lumbar spine   . CATARACT EXTRACTION, BILATERAL     per Dr. Katy Fitch   . COLONOSCOPY  never   she refuses to ever get one    Family History  Problem Relation Age of Onset  . Coronary artery disease Unknown    Social History   Socioeconomic History  . Marital status: Legally Separated    Spouse name: Not on file  . Number of children: Not on file  . Years of education: Not on file  . Highest education level: Not on file  Social Needs  .  Financial resource strain: Not on file  . Food insecurity - worry: Not on file  . Food insecurity - inability: Not on file  . Transportation needs - medical: Not on file  . Transportation needs - non-medical: Not on file  Occupational History  . Not on file  Tobacco Use  . Smoking status: Never Smoker  . Smokeless tobacco: Never Used  Substance and Sexual Activity  . Alcohol use: No    Alcohol/week: 0.0 oz  . Drug use: No  . Sexual activity: Not on file  Other Topics Concern  . Not on file  Social History Narrative  . Not on file    Outpatient Encounter Medications as of 06/20/2017  Medication Sig  . amLODipine (NORVASC) 5 MG tablet Take 1 tablet (5 mg total) by mouth daily.  Marland Kitchen aspirin 81 MG tablet Take 81 mg by mouth daily.    Marland Kitchen glucosamine-chondroitin 500-400 MG tablet Take 2 tablets by mouth daily.  . indomethacin (INDOCIN) 50 MG capsule Take 1 capsule (50 mg total) by mouth 3 (three) times daily as needed (gout pain).  Marland Kitchen  lisinopril (PRINIVIL,ZESTRIL) 20 MG tablet Take 1 tablet (20 mg total) by mouth daily.  . Loperamide HCl (IMODIUM PO) Take by mouth as needed.  . NON FORMULARY   . pravastatin (PRAVACHOL) 40 MG tablet Take 1 tablet (40 mg total) by mouth daily.   No facility-administered encounter medications on file as of 06/20/2017.     Activities of Daily Living No flowsheet data found.  Patient Care Team: Laurey Morale, MD as PCP - General    Assessment:   This is a routine wellness examination for [place].  Exercise Activities and Dietary recommendations    Goals    . Exercise 150 min/wk Moderate Activity     2 days a week back at gym        Fall Risk Fall Risk  06/20/2017 11/15/2016 06/20/2015 06/16/2014  Falls in the past year? No No Yes No  Comment - Emmi Telephone Survey: data to providers prior to load - -  Number falls in past yr: - - 1 -  Injury with Fall? - - Yes -  Follow up - - Falls evaluation completed -     Depression Screen PHQ 2/9  Scores 06/20/2017 06/20/2015 06/16/2014  PHQ - 2 Score 0 0 0     Cognitive Function     Ad8 score reviewed for issues:  Issues making decisions:  Less interest in hobbies / activities:  Repeats questions, stories (family complaining):  Trouble using ordinary gadgets (microwave, computer, phone):  Forgets the month or year:   Mismanaging finances:   Remembering appts:  Daily problems with thinking and/or memory: Ad8 score is= Sister had Alz [employer] TV shows  Drove 1300 miles on vacation last year  [employer] and [employer] History  Administered Date(s) Administered  . Influenza Split 02/10/2013  . Influenza, High Dose Seasonal PF 12/20/2016  . Influenza-Unspecified 01/27/2014  . Pneumococcal Conjugate-13 06/20/2015  . Zoster 03/26/2011      Screening Tests Health Maintenance  Topic Date Due  . TETANUS/TDAP  04/29/1956  . DEXA SCAN  04/29/2002  . PNA vac Low Risk Adult (2 of 2 - PPSV23) 06/19/2016  . INFLUENZA VACCINE  Completed    Advanced Directive; Reviewed advanced directive and agreed to receipt of information and discussion.  Focused face to face x  20 minutes discussing HCPOA and Living will and reviewed all the questions in the [place] forms. The patient voices understanding of HCPOA; LW reviewed and information provided on each question. Educated on how to revoke this HCPOA or LW at any time.   Also  discussed life prolonging measures (given a few examples) and where she could choose to initiate or not;  the ability to given the HCPOA power to change her living will or not if she cannot speak for herself; as well as finalizing the will by 2 unrelated witnesses and notary.  Will call for questions and given information on [facility] pastoral department for further assistance.         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 Medical and social history . Use of alcohol, tobacco or illicit drugs  . Current medications and supplements . Functional ability and status . Nutritional status . Physical activity . Advanced directives . List of other physicians . Hospitalizations, surgeries, and ER visits in previous 12 months . Vitals . Screenings to include cognitive, depression, and falls . Referrals and appointments  In addition, I have reviewed and discussed with patient certain preventive protocols, quality metrics, and best practice recommendations. A written personalized care plan for preventive services as well as general preventive health recommendations were provided to patient.     [email or web address],[email or web address], RN  06/20/2017  I have read this note and agree with its contents.  Alysia Penna, MD

## 2017-06-20 NOTE — Addendum Note (Signed)
Addended by: Myriam Forehand on: 06/20/2017 11:09 AM   Modules accepted: Orders

## 2017-06-20 NOTE — Addendum Note (Signed)
Addended by: Alysia Penna A on: 06/20/2017 09:20 AM   Modules accepted: Orders

## 2017-06-20 NOTE — Patient Instructions (Addendum)
Kathy Becker , Thank you for taking time to come for your Medicare Wellness Visit. I appreciate your ongoing commitment to your health goals. Please review the following plan we discussed and let me know if I can assist you in the future.   Educated to check with insurance regarding coverage of Shingles vaccination on Part D or Part B and may have lower co-pay if provided on the Part D side Shingrix is a vaccine for the prevention of Shingles in Adults 16 and older.  Please check with your benefits regarding applicable copays or out of pocket expenses. The Shingrix is given in 2 vaccines approx 8 weeks apart. You must receive the 2nd dose prior to 6 months from receipt of the first.   A Tetanus is recommended every 10 years. Medicare covers a tetanus if you have a cut or wound; otherwise, there may be a charge. If you had not had a tetanus with pertusses, known as the Tdap, you can take this anytime.   Manuela Schwartz will order Dexa at [facility] on [address] across from [facility] They are in the basement   Had your last pneumonia vaccine today   You can have a hearing screen where you like [facility] - can assist with hearing aid x 1  No reviews  [facility]  [address]  [phone]  [email or web address]   These are the goals we discussed: Goals    . Exercise 150 min/wk Moderate Activity     2 days a week back at gym        This is a list of the screening recommended for you and due dates:  Health Maintenance  Topic Date Due  . Tetanus Vaccine  04/29/1954  . DEXA scan (bone density measurement)  04/29/2002  . Pneumonia vaccines (2 of 2 - PPSV23) 06/19/2016  . Flu Shot  Completed     Bone Densitometry Bone densitometry is an imaging test that uses a special X-ray to measure the amount of calcium and other minerals in your bones (bone  density). This test is also known as a bone mineral density test or dual-energy X-ray absorptiometry (DXA). The test can measure bone density at your hip and your spine. It is similar to having a regular X-ray. You may have this test to:  Diagnose a condition that causes weak or thin bones (osteoporosis).  Predict your risk of a broken bone (fracture).  Determine how well osteoporosis treatment is working.  Tell a health care provider about:  Any allergies you have.  All medicines you are taking, including vitamins, herbs, eye drops, creams, and over-the-counter medicines.  Any problems you or family members have had with anesthetic medicines.  Any blood disorders you have.  Any surgeries you have had.  Any medical conditions you have.  Possibility of pregnancy.  Any other medical test you had within the previous 14 days that used contrast material. What are the risks? Generally, this is a safe procedure. However, problems can occur and may include the following:  This test exposes you to a very small amount of radiation.  The risks of radiation exposure may be greater to unborn children.  What happens before the procedure?  Do not take any calcium supplements for 24 hours before having the test. You can otherwise eat and drink what you usually do.  Take off all metal jewelry, eyeglasses, dental appliances, and any other metal objects. What happens during  the procedure?  You may lie on an exam table. There will be an X-ray generator below you and an imaging device above you.  Other devices, such as boxes or braces, may be used to position your body properly for the scan.  You will need to lie still while the machine slowly scans your body.  The images will show up on a computer monitor. What happens after the procedure? You may need more testing at a later time. This information is not intended to replace advice given to you by your health care provider. Make sure you  discuss any questions you have with your health care provider. Document Released: 05/07/2004 Document Revised: 09/21/2015 Document Reviewed: 09/23/2013 Elsevier Interactive Patient Education  2018 [place] in the Home Falls can cause injuries. They can happen to people of all ages. There are many things you can do to make your home safe and to help prevent falls. What can I do on the outside of my home?  Regularly fix the edges of walkways and driveways and fix any cracks.  Remove anything that might make you trip as you walk through a door, such as a raised step or threshold.  Trim any bushes or trees on the path to your home.  Use bright outdoor lighting.  Clear any walking paths of anything that might make someone trip, such as rocks or tools.  Regularly check to see if handrails are loose or broken. Make sure that both sides of any steps have handrails.  Any raised decks and porches should have guardrails on the edges.  Have any leaves, snow, or ice cleared regularly.  Use sand or salt on walking paths during winter.  Clean up any spills in your garage right away. This includes oil or grease spills. What can I do in the bathroom?  Use night lights.  Install grab bars by the toilet and in the tub and shower. Do not use towel bars as grab bars.  Use non-skid mats or decals in the tub or shower.  If you need to sit down in the shower, use a plastic, non-slip stool.  Keep the floor dry. Clean up any water that spills on the floor as soon as it happens.  Remove soap buildup in the tub or shower regularly.  Attach bath mats securely with double-sided non-slip rug tape.  Do not have throw rugs and other things on the floor that can make you trip. What can I do in the bedroom?  Use night lights.  Make sure that you have a light by your bed that is easy to reach.  Do not use any sheets or blankets that are too big for your bed. They should not  hang down onto the floor.  Have a firm chair that has side arms. You can use this for support while you get dressed.  Do not have throw rugs and other things on the floor that can make you trip. What can I do in the kitchen?  Clean up any spills right away.  Avoid walking on wet floors.  Keep items that you use a lot in easy-to-reach places.  If you need to reach something above you, use a strong step stool that has a grab bar.  Keep electrical cords out of the way.  Do not use floor polish or wax that makes floors slippery. If you must use wax, use non-skid floor wax.  Do not have throw rugs and other things on  the floor that can make you trip. What can I do with my stairs?  Do not leave any items on the stairs.  Make sure that there are handrails on both sides of the stairs and use them. Fix handrails that are broken or loose. Make sure that handrails are as long as the stairways.  Check any carpeting to make sure that it is firmly attached to the stairs. Fix any carpet that is loose or worn.  Avoid having throw rugs at the top or bottom of the stairs. If you do have throw rugs, attach them to the floor with carpet tape.  Make sure that you have a light switch at the top of the stairs and the bottom of the stairs. If you do not have them, ask someone to add them for you. What else can I do to help prevent falls?  Wear shoes that: ? Do not have high heels. ? Have rubber bottoms. ? Are comfortable and fit you well. ? Are closed at the toe. Do not wear sandals.  If you use a stepladder: ? Make sure that it is fully opened. Do not climb a closed stepladder. ? Make sure that both sides of the stepladder are locked into place. ? Ask someone to hold it for you, if possible.  Clearly mark and make sure that you can see: ? Any grab bars or handrails. ? First and last steps. ? Where the edge of each step is.  Use tools that help you move around (mobility aids) if they are  needed. These include: ? Canes. ? Walkers. ? Scooters. ? Crutches.  Turn on the lights when you go into a dark area. Replace any light bulbs as soon as they burn out.  Set up your furniture so you have a clear path. Avoid moving your furniture around.  If any of your floors are uneven, fix them.  If there are any pets around you, be aware of where they are.  Review your medicines with your doctor. Some medicines can make you feel dizzy. This can increase your chance of falling. Ask your doctor what other things that you can do to help prevent falls. This information is not intended to replace advice given to you by your health care provider. Make sure you discuss any questions you have with your health care provider. Document Released: 02/09/2009 Document Revised: 09/21/2015 Document Reviewed: 05/20/2014 Elsevier Interactive Patient Education  2018 [place] Maintenance, Female Adopting a healthy lifestyle and getting preventive care can go a long way to promote health and wellness. Talk with your health care provider about what schedule of regular examinations is right for you. This is a good chance for you to check in with your provider about disease prevention and staying healthy. In between checkups, there are plenty of things you can do on your own. Experts have done a lot of research about which lifestyle changes and preventive measures are most likely to keep you healthy. Ask your health care provider for more information. Weight and diet Eat a healthy diet  Be sure to include plenty of vegetables, fruits, low-fat dairy products, and lean protein.  Do not eat a lot of foods high in solid fats, added sugars, or salt.  Get regular exercise. This is one of the most important things you can do for your health. ? Most adults should exercise for at least 150 minutes each week. The exercise should increase your heart rate and make you sweat (moderate-intensity  exercise). ? Most adults should also do strengthening exercises at least twice a week. This is in addition to the moderate-intensity exercise.  Maintain a healthy weight  Body mass index (BMI) is a measurement that can be used to identify possible weight problems. It estimates body fat based on height and weight. Your health care provider can help determine your BMI and help you achieve or maintain a healthy weight.  For females 81 years of age and older: ? A BMI below 18.5 is considered underweight. ? A BMI of 18.5 to 24.9 is normal. ? A BMI of 25 to 29.9 is considered overweight. ? A BMI of 30 and above is considered obese.  Watch levels of cholesterol and blood lipids  You should start having your blood tested for lipids and cholesterol at 80 years of age, then have this test every 5 years.  You may need to have your cholesterol levels checked more often if: ? Your lipid or cholesterol levels are high. ? You are older than 80 years of age. ? You are at high risk for heart disease.  Cancer screening Lung Cancer  Lung cancer screening is recommended for adults 13-79 years old who are at high risk for lung cancer because of a history of smoking.  A yearly low-dose CT scan of the lungs is recommended for people who: ? Currently smoke. ? Have quit within the past 15 years. ? Have at least a 30-pack-year history of smoking. A pack year is smoking an average of one pack of cigarettes a day for 1 year.  Yearly screening should continue until it has been 15 years since you quit.  Yearly screening should stop if you develop a health problem that would prevent you from having lung cancer treatment.  Breast Cancer  Practice breast self-awareness. This means understanding how your breasts normally appear and feel.  It also means doing regular breast self-exams. Let your health care provider know about any changes, no matter how small.  If you are in your 20s or 30s, you should have a  clinical breast exam (CBE) by a health care provider every 1-3 years as part of a regular health exam.  If you are 80 or older, have a CBE every year. Also consider having a breast X-ray (mammogram) every year.  If you have a family history of breast cancer, talk to your health care provider about genetic screening.  If you are at high risk for breast cancer, talk to your health care provider about having an MRI and a mammogram every year.  Breast cancer gene (BRCA) assessment is recommended for women who have family members with BRCA-related cancers. BRCA-related cancers include: ? Breast. ? Ovarian. ? Tubal. ? Peritoneal cancers.  Results of the assessment will determine the need for genetic counseling and BRCA1 and BRCA2 testing.  Cervical Cancer Your health care provider may recommend that you be screened regularly for cancer of the pelvic organs (ovaries, uterus, and vagina). This screening involves a pelvic examination, including checking for microscopic changes to the surface of your cervix (Pap test). You may be encouraged to have this screening done every 3 years, beginning at age 48.  For women ages 107-65, health care providers may recommend pelvic exams and Pap testing every 3 years, or they may recommend the Pap and pelvic exam, combined with testing for human papilloma virus (HPV), every 5 years. Some types of HPV increase your risk of cervical cancer. Testing for HPV may also be done on  women of any age with unclear Pap test results.  Other health care providers may not recommend any screening for nonpregnant women who are considered low risk for pelvic cancer and who do not have symptoms. Ask your health care provider if a screening pelvic exam is right for you.  If you have had past treatment for cervical cancer or a condition that could lead to cancer, you need Pap tests and screening for cancer for at least 20 years after your treatment. If Pap tests have been discontinued,  your risk factors (such as having a new sexual partner) need to be reassessed to determine if screening should resume. Some women have medical problems that increase the chance of getting cervical cancer. In these cases, your health care provider may recommend more frequent screening and Pap tests.  Colorectal Cancer  This type of cancer can be detected and often prevented.  Routine colorectal cancer screening usually begins at 80 years of age and continues through 80 years of age.  Your health care provider may recommend screening at an earlier age if you have risk factors for colon cancer.  Your health care provider may also recommend using home test kits to check for hidden blood in the stool.  A small camera at the end of a tube can be used to examine your colon directly (sigmoidoscopy or colonoscopy). This is done to check for the earliest forms of colorectal cancer.  Routine screening usually begins at age 12.  Direct examination of the colon should be repeated every 5-10 years through 80 years of age. However, you may need to be screened more often if early forms of precancerous polyps or small growths are found.  Skin Cancer  Check your skin from head to toe regularly.  Tell your health care provider about any new moles or changes in moles, especially if there is a change in a mole's shape or color.  Also tell your health care provider if you have a mole that is larger than the size of a pencil eraser.  Always use sunscreen. Apply sunscreen liberally and repeatedly throughout the day.  Protect yourself by wearing long sleeves, pants, a wide-brimmed hat, and sunglasses whenever you are outside.  Heart disease, diabetes, and high blood pressure  High blood pressure causes heart disease and increases the risk of stroke. High blood pressure is more likely to develop in: ? People who have blood pressure in the high end of the normal range (130-139/85-89 mm Hg). ? People who are  overweight or obese. ? People who are African American.  If you are 22-30 years of age, have your blood pressure checked every 3-5 years. If you are 69 years of age or older, have your blood pressure checked every year. You should have your blood pressure measured twice-once when you are at a hospital or clinic, and once when you are not at a hospital or clinic. Record the average of the two measurements. To check your blood pressure when you are not at a hospital or clinic, you can use: ? An automated blood pressure machine at a pharmacy. ? A home blood pressure monitor.  If you are between 11 years and 59 years old, ask your health care provider if you should take aspirin to prevent strokes.  Have regular diabetes screenings. This involves taking a blood sample to check your fasting blood sugar level. ? If you are at a normal weight and have a low risk for diabetes, have this test once every  three years after 80 years of age. ? If you are overweight and have a high risk for diabetes, consider being tested at a younger age or more often. Preventing infection Hepatitis B  If you have a higher risk for hepatitis B, you should be screened for this virus. You are considered at high risk for hepatitis B if: ? You were born in a country where hepatitis B is common. Ask your health care provider which countries are considered high risk. ? Your parents were born in a high-risk country, and you have not been immunized against hepatitis B (hepatitis B vaccine). ? You have HIV or AIDS. ? You use needles to inject street drugs. ? You live with someone who has hepatitis B. ? You have had sex with someone who has hepatitis B. ? You get hemodialysis treatment. ? You take certain medicines for conditions, including cancer, organ transplantation, and autoimmune conditions.  Hepatitis C  Blood testing is recommended for: ? Everyone born from [birth year] through 1963. ? Anyone with known risk factors for  hepatitis C.  Sexually transmitted infections (STIs)  You should be screened for sexually transmitted infections (STIs) including gonorrhea and chlamydia if: ? You are sexually active and are younger than 80 years of age. ? You are older than 80 years of age and your health care provider tells you that you are at risk for this type of infection. ? Your sexual activity has changed since you were last screened and you are at an increased risk for chlamydia or gonorrhea. Ask your health care provider if you are at risk.  If you do not have HIV, but are at risk, it may be recommended that you take a prescription medicine daily to prevent HIV infection. This is called pre-exposure prophylaxis (PrEP). You are considered at risk if: ? You are sexually active and do not regularly use condoms or know the HIV status of your partner(s). ? You take drugs by injection. ? You are sexually active with a partner who has HIV.  Talk with your health care provider about whether you are at high risk of being infected with HIV. If you choose to begin PrEP, you should first be tested for HIV. You should then be tested every 3 months for as long as you are taking PrEP. Pregnancy  If you are premenopausal and you may become pregnant, ask your health care provider about preconception counseling.  If you may become pregnant, take 400 to 800 micrograms (mcg) of folic acid every day.  If you want to prevent pregnancy, talk to your health care provider about birth control (contraception). Osteoporosis and menopause  Osteoporosis is a disease in which the bones lose minerals and strength with aging. This can result in serious bone fractures. Your risk for osteoporosis can be identified using a bone density scan.  If you are 58 years of age or older, or if you are at risk for osteoporosis and fractures, ask your health care provider if you should be screened.  Ask your health care provider whether you should take a  calcium or vitamin D supplement to lower your risk for osteoporosis.  Menopause may have certain physical symptoms and risks.  Hormone replacement therapy may reduce some of these symptoms and risks. Talk to your health care provider about whether hormone replacement therapy is right for you. Follow these instructions at home:  Schedule regular health, dental, and eye exams.  Stay current with your immunizations.  Do not use any tobacco  products including cigarettes, chewing tobacco, or electronic cigarettes.  If you are pregnant, do not drink alcohol.  If you are breastfeeding, limit how much and how often you drink alcohol.  Limit alcohol intake to no more than 1 drink per day for nonpregnant women. One drink equals 12 ounces of beer, 5 ounces of wine, or 1 ounces of hard liquor.  Do not use street drugs.  Do not share needles.  Ask your health care provider for help if you need support or information about quitting drugs.  Tell your health care provider if you often feel depressed.  Tell your health care provider if you have ever been abused or do not feel safe at home. This information is not intended to replace advice given to you by your health care provider. Make sure you discuss any questions you have with your health care provider. Document Released: 10/29/2010 Document Revised: 09/21/2015 Document Reviewed: 01/17/2015 Elsevier Interactive Patient Education  2018 [employer].   Hearing Loss Hearing loss is a partial or total loss of the ability to hear. This can be temporary or permanent, and it can happen in one or both ears. Hearing loss may be referred to as deafness. Medical care is necessary to treat hearing loss properly and to prevent the condition from getting worse. Your hearing may partially or completely come back, depending on what caused your hearing loss and how severe it is. In some cases, hearing loss is permanent. What are the causes? Common causes  of hearing loss include:  Too much wax in the ear canal.  Infection of the ear canal or middle ear.  Fluid in the middle ear.  Injury to the ear or surrounding area.  An object stuck in the ear.  Prolonged exposure to loud sounds, such as music.  Less common causes of hearing loss include:  Tumors in the ear.  Viral or bacterial infections, such as meningitis.  A hole in the eardrum (perforated eardrum).  Problems with the hearing nerve that sends signals between the brain and the ear.  Certain medicines.  What are the signs or symptoms? Symptoms of this condition may include:  Difficulty telling the difference between sounds.  Difficulty following a conversation when there is background noise.  Lack of response to sounds in your environment. This may be most noticeable when you do not respond to startling sounds.  Needing to turn up the volume on the television, radio, etc.  Ringing in the ears.  Dizziness.  Pain in the ears.  How is this diagnosed? This condition is diagnosed based on a physical exam and a hearing test (audiometry). The audiometry test will be performed by a hearing specialist (audiologist). You may also be referred to an ear, nose, and throat (ENT) specialist (otolaryngologist). How is this treated? Treatment for recent onset of hearing loss may include:  Ear wax removal.  Being prescribed medicines to prevent infection (antibiotics).  Being prescribed medicines to reduce inflammation (corticosteroids).  Follow these instructions at home:  If you were prescribed an antibiotic medicine, take it as told by your health care provider. Do not stop taking the antibiotic even if you start to feel better.  Take over-the-counter and prescription medicines only as told by your health care provider.  Avoid loud noises.  Return to your normal activities as told by your health care provider. Ask your health care provider what activities are safe  for you.  Keep all follow-up visits as told by your health care provider.  This is important. Contact a health care provider if:  You feel dizzy.  You develop new symptoms.  You vomit or feel nauseous.  You have a fever. Get help right away if:  You develop sudden changes in your vision.  You have severe ear pain.  You have new or increased weakness.  You have a severe headache. This information is not intended to replace advice given to you by your health care provider. Make sure you discuss any questions you have with your health care provider. Document Released: 04/15/2005 Document Revised: 09/21/2015 Document Reviewed: 08/31/2014 Elsevier Interactive Patient Education  2018 [employer].

## 2017-06-23 ENCOUNTER — Telehealth: Payer: Self-pay | Admitting: Family Medicine

## 2017-06-23 MED ORDER — PRAVASTATIN SODIUM 40 MG PO TABS: 40.0000 mg | ORAL_TABLET | Freq: Every day | ORAL | 11 refills | Status: DC

## 2017-06-23 MED ORDER — AMLODIPINE BESYLATE 5 MG PO TABS: 5.0000 mg | ORAL_TABLET | Freq: Every day | ORAL | 11 refills | Status: DC

## 2017-06-23 MED ORDER — LISINOPRIL 20 MG PO TABS: 20.0000 mg | ORAL_TABLET | Freq: Every day | ORAL | 11 refills | Status: DC

## 2017-06-23 NOTE — Telephone Encounter (Signed)
Copied from [place] [id number]. Topic: Quick Communication - Rx Refill/Question >> Jun 23, 2017 12:35 PM [name] wrote: Medication: amLODipine (NORVASC) 5 MG tablet [[id number]] ,  lisinopril (PRINIVIL,ZESTRIL) 20 MG tablet [[id number]] ,  pravastatin (PRAVACHOL) 40 MG tablet [[id number]]    Has the patient contacted their pharmacy? Yes.     (Agent: If no, request that the patient contact the pharmacy for the refill.)   Preferred Pharmacy (with phone number or street name): [facility]   Agent: Please be advised that RX refills may take up to 3 business days. We ask that you follow-up with your pharmacy.

## 2017-07-08 ENCOUNTER — Telehealth: Payer: Self-pay | Admitting: Family Medicine

## 2017-07-08 NOTE — Telephone Encounter (Signed)
Copied from [place] [id number]. Topic: Quick Communication - Rx Refill/Question >> Jul 08, 2017 11:22 AM Scherrie Gerlach wrote: Medication: indomethacin (INDOCIN) 50 MG capsule   Has the patient contacted their pharmacy? no pharmacy [facility] and pt has been having problems getting her meds) Pt states she called , all other meds sent in but this.  Pt has not had this med prescribed since 2017,  Pt has a flair up off gout in her big toe and only uses this med for this.   [id number] - [address], [place] [phone] (Phone) [phone] (Fax)

## 2017-07-08 NOTE — Telephone Encounter (Signed)
Sent to PCP for approval.  

## 2017-07-24 ENCOUNTER — Telehealth: Payer: Self-pay | Admitting: Family Medicine

## 2017-07-24 NOTE — Telephone Encounter (Signed)
Copied from [place] [id number]. Topic: General >> Jul 24, 2017 11:58 AM Cecelia Byars, NT wrote: [id number] - [address], [place] called and needs a copy of the prescription for indomethacin (INDOCIN) 50 MG capsule  Faxed to them

## 2017-07-25 MED ORDER — INDOMETHACIN 50 MG PO CAPS: 50.0000 mg | ORAL_CAPSULE | Freq: Three times a day (TID) | ORAL | 5 refills | Status: DC | PRN

## 2017-07-25 NOTE — Telephone Encounter (Signed)
prescription has been faxed to [facility].

## 2017-07-25 NOTE — Telephone Encounter (Signed)
Last OV 06/20/2017   Last refilled 09/05/2015 disp 60 with 5 refills   Sent to PCP for approval to refill again if so I will fax Rx to pharmacy

## 2017-07-25 NOTE — Telephone Encounter (Signed)
Ready to fax  

## 2017-08-01 ENCOUNTER — Telehealth: Payer: Self-pay

## 2017-08-01 MED ORDER — NABUMETONE 500 MG PO TABS: 500.0000 mg | ORAL_TABLET | Freq: Two times a day (BID) | ORAL | 3 refills | Status: DC

## 2017-08-01 NOTE — Telephone Encounter (Signed)
[facility]   * your drug is NOT on our list of covered drugs formulary or is subject to certain limits *  STOP Indomethacin and START call in Nabumetone 500 MG BID disp 180 with 3 refills   Rx has been sent

## 2017-09-23 DIAGNOSIS — D225 Melanocytic nevi of trunk: Secondary | ICD-10-CM | POA: Diagnosis not present

## 2017-09-23 DIAGNOSIS — Z85828 Personal history of other malignant neoplasm of skin: Secondary | ICD-10-CM | POA: Diagnosis not present

## 2017-09-23 DIAGNOSIS — L814 Other melanin hyperpigmentation: Secondary | ICD-10-CM | POA: Diagnosis not present

## 2017-09-23 DIAGNOSIS — L570 Actinic keratosis: Secondary | ICD-10-CM | POA: Diagnosis not present

## 2017-09-23 DIAGNOSIS — L718 Other rosacea: Secondary | ICD-10-CM | POA: Diagnosis not present

## 2018-01-21 DIAGNOSIS — Z23 Encounter for immunization: Secondary | ICD-10-CM | POA: Diagnosis not present

## 2018-03-23 DIAGNOSIS — L989 Disorder of the skin and subcutaneous tissue, unspecified: Secondary | ICD-10-CM | POA: Diagnosis not present

## 2018-03-23 DIAGNOSIS — L718 Other rosacea: Secondary | ICD-10-CM | POA: Diagnosis not present

## 2018-03-23 DIAGNOSIS — D485 Neoplasm of uncertain behavior of skin: Secondary | ICD-10-CM | POA: Diagnosis not present

## 2018-06-14 ENCOUNTER — Other Ambulatory Visit: Payer: Self-pay | Admitting: Family Medicine

## 2018-06-22 ENCOUNTER — Ambulatory Visit (INDEPENDENT_AMBULATORY_CARE_PROVIDER_SITE_OTHER): Payer: Medicare Other | Admitting: Family Medicine

## 2018-06-22 ENCOUNTER — Encounter: Payer: Self-pay | Admitting: Family Medicine

## 2018-06-22 VITALS — BP 130/82 | HR 88 | Temp 97.5°F | Ht 61.0 in | Wt 139.2 lb

## 2018-06-22 DIAGNOSIS — I2510 Atherosclerotic heart disease of native coronary artery without angina pectoris: Secondary | ICD-10-CM

## 2018-06-22 DIAGNOSIS — R55 Syncope and collapse: Secondary | ICD-10-CM | POA: Diagnosis not present

## 2018-06-22 DIAGNOSIS — I10 Essential (primary) hypertension: Secondary | ICD-10-CM | POA: Diagnosis not present

## 2018-06-22 DIAGNOSIS — N183 Chronic kidney disease, stage 3 unspecified: Secondary | ICD-10-CM | POA: Insufficient documentation

## 2018-06-22 DIAGNOSIS — E782 Mixed hyperlipidemia: Secondary | ICD-10-CM

## 2018-06-22 DIAGNOSIS — R0989 Other specified symptoms and signs involving the circulatory and respiratory systems: Secondary | ICD-10-CM

## 2018-06-22 LAB — CBC WITH DIFFERENTIAL/PLATELET
BASOS ABS: 0.1 10*3/uL (ref 0.0–0.1)
Basophils Relative: 1 % (ref 0.0–3.0)
Eosinophils Absolute: 0.1 10*3/uL (ref 0.0–0.7)
Eosinophils Relative: 0.9 % (ref 0.0–5.0)
HCT: 43.4 % (ref 36.0–46.0)
Hemoglobin: 14.1 g/dL (ref 12.0–15.0)
Lymphocytes Relative: 23.3 % (ref 12.0–46.0)
Lymphs Abs: 1.4 10*3/uL (ref 0.7–4.0)
MCHC: 32.5 g/dL (ref 30.0–36.0)
MCV: 88.5 fl (ref 78.0–100.0)
Monocytes Absolute: 0.5 10*3/uL (ref 0.1–1.0)
Monocytes Relative: 7.4 % (ref 3.0–12.0)
Neutro Abs: 4.2 10*3/uL (ref 1.4–7.7)
Neutrophils Relative %: 67.4 % (ref 43.0–77.0)
Platelets: 136 10*3/uL — ABNORMAL LOW (ref 150.0–400.0)
RBC: 4.9 Mil/uL (ref 3.87–5.11)
RDW: 14.3 % (ref 11.5–15.5)
WBC: 6.2 10*3/uL (ref 4.0–10.5)

## 2018-06-22 LAB — BASIC METABOLIC PANEL
BUN: 31 mg/dL — ABNORMAL HIGH (ref 6–23)
CALCIUM: 9.2 mg/dL (ref 8.4–10.5)
CO2: 25 meq/L (ref 19–32)
Chloride: 106 mEq/L (ref 96–112)
Creatinine, Ser: 1.24 mg/dL — ABNORMAL HIGH (ref 0.40–1.20)
GFR: 41.5 mL/min — AB (ref >60.00)
Glucose, Bld: 94 mg/dL (ref 70–99)
POTASSIUM: 4.6 meq/L (ref 3.5–5.1)
SODIUM: 141 meq/L (ref 135–145)

## 2018-06-22 LAB — LIPID PANEL
CHOLESTEROL: 234 mg/dL — AB (ref 0–200)
HDL: 64.8 mg/dL (ref >39.00)
LDL CALC: 145 mg/dL — AB (ref 0–99)
NONHDL: 169.11
Total CHOL/HDL Ratio: 4
Triglycerides: 119 mg/dL (ref 0.0–149.0)
VLDL: 23.8 mg/dL (ref 0.0–40.0)

## 2018-06-22 LAB — TSH: TSH: 0.81 u[IU]/mL (ref 0.35–4.50)

## 2018-06-22 LAB — HEPATIC FUNCTION PANEL
ALK PHOS: 114 U/L (ref 39–117)
ALT: 11 U/L (ref 0–35)
AST: 16 U/L (ref 0–37)
Albumin: 4.2 g/dL (ref 3.5–5.2)
BILIRUBIN DIRECT: 0.1 mg/dL (ref 0.0–0.3)
Total Bilirubin: 0.4 mg/dL (ref 0.2–1.2)
Total Protein: 6.8 g/dL (ref 6.0–8.3)

## 2018-06-22 NOTE — Progress Notes (Signed)
Subjective:    Patient ID: Colon Flattery, female    DOB: 04-Aug-1935, 83 y.o.   MRN: [id number]  HPI Here to follow up on issues. Her BP has been somewhat labile at home but it seems to average in the proper range. She stays active and she denies any SOB or chest discomfort. However she does relate 3 episodes in the past month of lightheadedness where she felt as if she would pass out. No headaches or dizziness. Each time she sat down for a few minutes and the episode passed.    Review of Systems  Constitutional: Negative.   HENT: Negative.   Eyes: Negative.   Respiratory: Negative.   Cardiovascular: Negative.   Gastrointestinal: Negative.   Genitourinary: Negative for decreased urine volume, difficulty urinating, dyspareunia, dysuria, enuresis, flank pain, frequency, hematuria, pelvic pain and urgency.  Musculoskeletal: Negative.   Skin: Negative.   Neurological: Positive for light-headedness.  Psychiatric/Behavioral: Negative.        Objective:   Physical Exam Constitutional:      General: She is not in acute distress.    Appearance: She is well-developed.  HENT:     Head: Normocephalic and atraumatic.     Right Ear: External ear normal.     Left Ear: External ear normal.     Nose: Nose normal.     Mouth/Throat:     Pharynx: No oropharyngeal exudate.  Eyes:     General: No scleral icterus.    Conjunctiva/sclera: Conjunctivae normal.     Pupils: Pupils are equal, round, and reactive to light.  Neck:     Musculoskeletal: Normal range of motion and neck supple.     Thyroid: No thyromegaly.     Vascular: No JVD.     Comments: A bruit is heard in the left carotid  Cardiovascular:     Rate and Rhythm: Normal rate and regular rhythm.     Heart sounds: Normal heart sounds. No murmur. No friction rub. No gallop.      Comments: EKG is stable with NSR and poor R wave progression Pulmonary:     Effort: Pulmonary effort is normal. No respiratory distress.     Breath sounds:  Normal breath sounds. No wheezing or rales.  Chest:     Chest wall: No tenderness.  Abdominal:     General: Bowel sounds are normal. There is no distension.     Palpations: Abdomen is soft. There is no mass.     Tenderness: There is no abdominal tenderness. There is no guarding or rebound.  Musculoskeletal: Normal range of motion.        General: No tenderness.  Lymphadenopathy:     Cervical: No cervical adenopathy.  Skin:    General: Skin is warm and dry.     Findings: No erythema or rash.  Neurological:     Mental Status: She is alert and oriented to person, place, and time.     Cranial Nerves: No cranial nerve deficit.     Motor: No abnormal muscle tone.     Coordination: Coordination normal.     Deep Tendon Reflexes: Reflexes are normal and symmetric. Reflexes normal.  Psychiatric:        Behavior: Behavior normal.        Thought Content: Thought content normal.        Judgment: Judgment normal.           Assessment & Plan:  Her HTN seems to be reasonably stable. We will get  fasting labs to check lipids, renal function, etc. She has had some lightheaded spells and we will investigate with an ECHO and carotid dopplers.  Alysia Penna, MD

## 2018-06-22 NOTE — Progress Notes (Deleted)
Subjective:   Kathy Becker is a 83 y.o. female who presents for Medicare Annual (Subsequent) preventive examination.  Review of Systems:  No ROS.  Medicare Wellness Visit. Additional risk factors are reflected in the social history.    Sleep patterns: {SX; SLEEP PATTERNS:18802::"feels rested on waking","does not get up to void","gets up *** times nightly to void","sleeps *** hours nightly"}.    Home Safety/Smoke Alarms: Feels safe in home. Smoke alarms in place.  Living environment; residence and Firearm Safety: {Rehab home environment / accessibility:30080::"no firearms","firearms stored safely"}. Seat Belt Safety/Bike Helmet: Wears seat belt.   Female:   Pap-       Mammo- 08/2003      Dexa scan-        CCS-       Objective:     Vitals: There were no vitals taken for this visit.  There is no height or weight on file to calculate BMI.  Advanced Directives 06/20/2017  Does Patient Have a Medical Advance Directive? No    Tobacco Social History   Tobacco Use  Smoking Status Never Smoker  Smokeless Tobacco Never Used     Counseling given: Not Answered    Past Medical History:  Diagnosis Date  . CAD (coronary artery disease)    stress test 08-15-08 showing lateral scar but no reversible ischemia   . Hyperlipidemia   . Hypertension   . Myocardial infarction ([place])   . Renal insufficiency    Past Surgical History:  Procedure Laterality Date  . BACK SURGERY  1988   lumbar spine   . CATARACT EXTRACTION, BILATERAL     per Dr. Katy Fitch   . COLONOSCOPY  never   she refuses to ever get one    Family History  Problem Relation Age of Onset  . Coronary artery disease Other    Social History   Socioeconomic History  . Marital status: Legally Separated    Spouse name: Not on file  . Number of children: Not on file  . Years of education: Not on file  . Highest education level: Not on file  Occupational History  . Not on file  Social Needs  . Financial resource  strain: Not on file  . Food insecurity:    Worry: Not on file    Inability: Not on file  . Transportation needs:    Medical: Not on file    Non-medical: Not on file  Tobacco Use  . Smoking status: Never Smoker  . Smokeless tobacco: Never Used  Substance and Sexual Activity  . Alcohol use: No    Alcohol/week: 0.0 standard drinks  . Drug use: No  . Sexual activity: Not on file  Lifestyle  . Physical activity:    Days per week: Not on file    Minutes per session: Not on file  . Stress: Not on file  Relationships  . Social connections:    Talks on phone: Not on file    Gets together: Not on file    Attends religious service: Not on file    Active member of club or organization: Not on file    Attends meetings of clubs or organizations: Not on file    Relationship status: Not on file  Other Topics Concern  . Not on file  Social History Narrative  . Not on file    Outpatient Encounter Medications as of 06/23/2018  Medication Sig  . amLODipine (NORVASC) 5 MG tablet TAKE 1 TABLET(5 MG) BY MOUTH DAILY  .  aspirin 81 MG tablet Take 81 mg by mouth daily.    Marland Kitchen glucosamine-chondroitin 500-400 MG tablet Take 2 tablets by mouth daily.  Marland Kitchen lisinopril (PRINIVIL,ZESTRIL) 20 MG tablet TAKE 1 TABLET BY MOUTH EVERY DAY  . pravastatin (PRAVACHOL) 40 MG tablet TAKE 1 TABLET(40 MG) BY MOUTH DAILY   No facility-administered encounter medications on file as of 06/23/2018.     Activities of Daily Living In your present state of health, do you have any difficulty performing the following activities: 06/22/2018  Hearing? N  Vision? N  Difficulty concentrating or making decisions? N  Walking or climbing stairs? N  Dressing or bathing? N  Doing errands, shopping? N  Some recent data might be hidden    Patient Care Team: Laurey Morale, MD as PCP - General    Assessment:   This is a routine wellness examination for [place]. Physical assessment deferred to PCP.   Exercise Activities and  Dietary recommendations   Diet (meal preparation, eat out, water intake, caffeinated beverages, dairy products, fruits and vegetables): {Desc; diets:16563}       Goals    . Exercise 150 min/wk Moderate Activity     2 days a week back at gym        Fall Risk Fall Risk  06/22/2018 06/20/2017 06/20/2017 11/15/2016 06/20/2015  Falls in the past year? 1 No No No Yes  Comment - - - Emmi Telephone Survey: data to providers prior to load -  Number falls in past yr: - - - - 1  Injury with Fall? 0 - - - Yes  Follow up - - - - Falls evaluation completed     Depression Screen PHQ 2/9 Scores 06/22/2018 06/20/2017 06/20/2017 06/20/2015  PHQ - 2 Score 1 0 0 0     Cognitive Function MMSE - Mini Mental State Exam 06/20/2017  Not completed: (No Data)        Immunization History  Administered Date(s) Administered  . Influenza Split 02/10/2013  . Influenza, High Dose Seasonal PF 12/20/2016, 01/21/2018  . Influenza-Unspecified 01/27/2014  . Pneumococcal Conjugate-13 06/20/2015  . Pneumococcal Polysaccharide-23 06/20/2017  . Zoster 03/26/2011    Qualifies for Shingles Vaccine?  Screening Tests Health Maintenance  Topic Date Due  . TETANUS/TDAP  04/29/1956  . DEXA SCAN  04/29/2002  . INFLUENZA VACCINE  Completed  . PNA vac Low Risk Adult  Completed       Plan:      I have personally reviewed and noted the following in the patient's chart:   . Medical and social history . Use of alcohol, tobacco or illicit drugs  . Current medications and supplements . Functional ability and status . Nutritional status . Physical activity . Advanced directives . List of other physicians . Vitals . Screenings to include cognitive, depression, and falls . Referrals and appointments  In addition, I have reviewed and discussed with patient certain preventive protocols, quality metrics, and best practice recommendations. A written personalized care plan for preventive services as well as general  preventive health recommendations were provided to patient.     Alphia Moh, RN  06/22/2018

## 2018-06-23 ENCOUNTER — Ambulatory Visit: Payer: Medicare Other

## 2018-06-23 ENCOUNTER — Telehealth: Payer: Self-pay

## 2018-06-23 ENCOUNTER — Ambulatory Visit (HOSPITAL_COMMUNITY): Payer: Medicare Other | Attending: Family Medicine

## 2018-06-23 DIAGNOSIS — R55 Syncope and collapse: Secondary | ICD-10-CM

## 2018-06-23 NOTE — Telephone Encounter (Signed)
Author phoned pt. to offer to reschedule AWV, as pt. late to appointment, and has upcoming appointment for echo today.  Pt. stated she did not know anything about awv appointment, and author explained what visit entails and offered to reschedule. Pt. stated, "my husband is in the hospital, and I have too much going on right now. Call me back in a month?". Pt. declined any assistance needs at this time, and plans to get echocardiogram done this AM.

## 2018-06-26 ENCOUNTER — Telehealth: Payer: Self-pay | Admitting: Family Medicine

## 2018-06-26 ENCOUNTER — Ambulatory Visit (HOSPITAL_COMMUNITY)
Admission: RE | Admit: 2018-06-26 | Discharge: 2018-06-26 | Disposition: A | Discharge status: Home / Self Care | Payer: Medicare Other | Source: Ambulatory Visit | Attending: Cardiology | Admitting: Cardiology

## 2018-06-26 ENCOUNTER — Encounter (HOSPITAL_COMMUNITY): Payer: Self-pay

## 2018-06-26 DIAGNOSIS — I6522 Occlusion and stenosis of left carotid artery: Secondary | ICD-10-CM

## 2018-06-26 DIAGNOSIS — R55 Syncope and collapse: Secondary | ICD-10-CM

## 2018-06-26 DIAGNOSIS — R0989 Other specified symptoms and signs involving the circulatory and respiratory systems: Secondary | ICD-10-CM | POA: Diagnosis not present

## 2018-06-26 NOTE — Telephone Encounter (Signed)
Will send to dr Sarajane Jews as Juluis Rainier.

## 2018-06-26 NOTE — Telephone Encounter (Signed)
Kathy Becker with Vascular lab at [facility] calling with a critical carotid study - 80-99 % stenosis on the left. No answer with FC. Will send high priority.

## 2018-06-26 NOTE — Addendum Note (Signed)
Addended by: Alysia Penna A on: 06/26/2018 04:56 PM   Modules accepted: Orders

## 2018-06-26 NOTE — Progress Notes (Signed)
Carotid duplex performed. Evidence of an 80-99% left carotid stenosis. Preliminary results called to Dr. Barbie Banner office. See final report under CV proc.

## 2018-06-26 NOTE — Telephone Encounter (Signed)
I did an urgent referral to Vascular Surgery for this. She should continue to take aspirin 81 mg daly

## 2018-06-26 NOTE — Telephone Encounter (Signed)
Dr. Sarajane Jews ---FYI on the results from the vascular lab.  Anything further needed?  Thanks

## 2018-07-02 ENCOUNTER — Other Ambulatory Visit: Payer: Self-pay | Admitting: *Deleted

## 2018-07-02 ENCOUNTER — Other Ambulatory Visit: Payer: Self-pay

## 2018-07-02 ENCOUNTER — Other Ambulatory Visit: Payer: Self-pay | Admitting: Family Medicine

## 2018-07-02 ENCOUNTER — Encounter: Payer: Self-pay | Admitting: *Deleted

## 2018-07-02 ENCOUNTER — Ambulatory Visit (INDEPENDENT_AMBULATORY_CARE_PROVIDER_SITE_OTHER): Payer: Medicare Other | Admitting: Vascular Surgery

## 2018-07-02 ENCOUNTER — Ambulatory Visit (HOSPITAL_COMMUNITY)
Admission: RE | Admit: 2018-07-02 | Discharge: 2018-07-02 | Disposition: A | Discharge status: Home / Self Care | Payer: Medicare Other | Source: Ambulatory Visit | Attending: Vascular Surgery | Admitting: Vascular Surgery

## 2018-07-02 ENCOUNTER — Encounter: Payer: Self-pay | Admitting: Vascular Surgery

## 2018-07-02 VITALS — BP 138/76 | HR 78 | Temp 98.0°F | Resp 20 | Ht 61.0 in | Wt 140.0 lb

## 2018-07-02 DIAGNOSIS — R270 Ataxia, unspecified: Secondary | ICD-10-CM

## 2018-07-02 DIAGNOSIS — R42 Dizziness and giddiness: Secondary | ICD-10-CM

## 2018-07-02 DIAGNOSIS — R9389 Abnormal findings on diagnostic imaging of other specified body structures: Secondary | ICD-10-CM

## 2018-07-02 DIAGNOSIS — G3281 Cerebellar ataxia in diseases classified elsewhere: Secondary | ICD-10-CM

## 2018-07-02 DIAGNOSIS — R0989 Other specified symptoms and signs involving the circulatory and respiratory systems: Secondary | ICD-10-CM

## 2018-07-02 DIAGNOSIS — R269 Unspecified abnormalities of gait and mobility: Secondary | ICD-10-CM

## 2018-07-02 DIAGNOSIS — I69998 Other sequelae following unspecified cerebrovascular disease: Secondary | ICD-10-CM

## 2018-07-02 DIAGNOSIS — R278 Other lack of coordination: Secondary | ICD-10-CM

## 2018-07-02 DIAGNOSIS — R2689 Other abnormalities of gait and mobility: Secondary | ICD-10-CM

## 2018-07-02 DIAGNOSIS — IMO0002 Reserved for concepts with insufficient information to code with codable children: Secondary | ICD-10-CM

## 2018-07-02 DIAGNOSIS — R4702 Dysphasia: Secondary | ICD-10-CM

## 2018-07-02 DIAGNOSIS — R55 Syncope and collapse: Secondary | ICD-10-CM

## 2018-07-02 DIAGNOSIS — I2510 Atherosclerotic heart disease of native coronary artery without angina pectoris: Secondary | ICD-10-CM

## 2018-07-02 DIAGNOSIS — I6522 Occlusion and stenosis of left carotid artery: Secondary | ICD-10-CM | POA: Diagnosis not present

## 2018-07-02 DIAGNOSIS — G441 Vascular headache, not elsewhere classified: Secondary | ICD-10-CM

## 2018-07-02 DIAGNOSIS — I69990 Apraxia following unspecified cerebrovascular disease: Secondary | ICD-10-CM

## 2018-07-02 DIAGNOSIS — R29898 Other symptoms and signs involving the musculoskeletal system: Secondary | ICD-10-CM

## 2018-07-02 DIAGNOSIS — I6529 Occlusion and stenosis of unspecified carotid artery: Secondary | ICD-10-CM

## 2018-07-02 DIAGNOSIS — G44021 Chronic cluster headache, intractable: Secondary | ICD-10-CM

## 2018-07-02 NOTE — Progress Notes (Signed)
Referring Physician: Dr Alysia Penna  Patient name: Kathy Becker MRN: [id number] DOB: 1935/06/29 Sex: female  REASON FOR CONSULT: Asymptomatic greater than 80% left internal carotid artery stenosis  HPI: Kathy Becker is a 83 y.o. female sent for evaluation of a left internal carotid artery stenosis.  She recently had an episode where she had some problems with balance and dizziness.  A carotid duplex was ordered which showed greater than 80% left internal carotid artery stenosis.  The patient denies prior stroke.  She did have a myocardial infarction approximately 20 years ago and received an angioplasty for that.  She denies any localizing symptoms of TIA amaurosis or stroke.  She denies any slurred speech.  Is a former smoker but quit about 20 years ago.  Before that she was a heavy smoker for many years.  She is on aspirin and a statin.  Other medical problems include hypertension, hyperlipidemia both of which are currently stable.  She is very active and climbs stairs in her house multiple times a day without shortness of breath or chest pain.  Past Medical History:  Diagnosis Date  . CAD (coronary artery disease)    stress test 08-15-08 showing lateral scar but no reversible ischemia   . Hyperlipidemia   . Hypertension   . Myocardial infarction ([place])   . Renal insufficiency    Past Surgical History:  Procedure Laterality Date  . BACK SURGERY  1988   lumbar spine   . CATARACT EXTRACTION, BILATERAL     per Dr. Katy Fitch   . COLONOSCOPY  never   she refuses to ever get one     Family History  Problem Relation Age of Onset  . Coronary artery disease Other     SOCIAL HISTORY: Social History   Socioeconomic History  . Marital status: Legally Separated    Spouse name: Not on file  . Number of children: Not on file  . Years of education: Not on file  . Highest education level: Not on file  Occupational History  . Not on file  Social Needs  . Financial resource strain: Not  on file  . Food insecurity:    Worry: Not on file    Inability: Not on file  . Transportation needs:    Medical: Not on file    Non-medical: Not on file  Tobacco Use  . Smoking status: Never Smoker  . Smokeless tobacco: Never Used  Substance and Sexual Activity  . Alcohol use: No    Alcohol/week: 0.0 standard drinks  . Drug use: No  . Sexual activity: Not on file  Lifestyle  . Physical activity:    Days per week: Not on file    Minutes per session: Not on file  . Stress: Not on file  Relationships  . Social connections:    Talks on phone: Not on file    Gets together: Not on file    Attends religious service: Not on file    Active member of club or organization: Not on file    Attends meetings of clubs or organizations: Not on file    Relationship status: Not on file  . Intimate partner violence:    Fear of current or ex partner: Not on file    Emotionally abused: Not on file    Physically abused: Not on file    Forced sexual activity: Not on file  Other Topics Concern  . Not on file  Social History Narrative  . Not  on file    Allergies  Allergen Reactions  . Sulfonamide Derivatives     Current Outpatient Medications  Medication Sig Dispense Refill  . amLODipine (NORVASC) 5 MG tablet TAKE 1 TABLET(5 MG) BY MOUTH DAILY 30 tablet 11  . aspirin 81 MG tablet Take 81 mg by mouth daily.      Marland Kitchen doxycycline (PERIOSTAT) 20 MG tablet Take 20 mg by mouth daily.    Marland Kitchen glucosamine-chondroitin 500-400 MG tablet Take 2 tablets by mouth daily.    Marland Kitchen lisinopril (PRINIVIL,ZESTRIL) 20 MG tablet TAKE 1 TABLET BY MOUTH EVERY DAY 30 tablet 11  . pravastatin (PRAVACHOL) 40 MG tablet TAKE 1 TABLET(40 MG) BY MOUTH DAILY 30 tablet 11   No current facility-administered medications for this visit.     ROS:   General:  No weight loss, Fever, chills  HEENT: No recent headaches, no nasal bleeding, no visual changes, no sore throat  Neurologic: +dizziness,no  blackouts, seizures. No  recent symptoms of stroke or mini- stroke. No recent episodes of slurred speech, or temporary blindness.  Cardiac: No recent episodes of chest pain/pressure, no shortness of breath at rest.  No shortness of breath with exertion.  Denies history of atrial fibrillation or irregular heartbeat  Vascular: No history of rest pain in feet.  No history of claudication.  No history of non-healing ulcer, No history of DVT   Pulmonary: No home oxygen, no productive cough, no hemoptysis,  No asthma or wheezing  Musculoskeletal:  [ ]  Arthritis, [ ]  Low back pain,  [ ]  Joint pain  Hematologic:No history of hypercoagulable state.  No history of easy bleeding.  No history of anemia  Gastrointestinal: No hematochezia or melena,  No gastroesophageal reflux, no trouble swallowing  Urinary: [ ]  chronic Kidney disease, [ ]  on HD - [ ]  MWF or [ ]  TTHS, [ ]  Burning with urination, [ ]  Frequent urination, [ ]  Difficulty urinating;   Skin: No rashes  Psychological: No history of anxiety,  No history of depression   Physical Examination  Vitals:   07/02/18 1348 07/02/18 1352  BP: (!) 151/86 138/76  Pulse: 78   Resp: 20   Temp: 98 F (36.7 C)   SpO2: 99%   Weight: 139 lb 15.9 oz (63.5 kg)   Height: 5\' 1"  (1.549 m)     Body mass index is 26.45 kg/m.  General:  Alert and oriented, no acute distress HEENT: Normal Neck: No bruit or JVD Pulmonary: Clear to auscultation bilaterally Cardiac: Regular Rate and Rhythm without murmur Abdomen: Soft, non-tender, non-distended, no mass, no scars, audible aortic pulsation slightly widened approximately 3 cm possibly more on palpation Skin: No rash Extremity Pulses:  2+ radial, brachial, 1+ right 2+ left femoral, absent popliteal dorsalis pedis, posterior tibial pulses bilaterally Musculoskeletal: No deformity or edema  Neurologic: Upper and lower extremity motor 5/5 and symmetric, no facial asymmetry  DATA:  I reviewed the patient's previous carotid duplex  exam dated June 26, 2018.  This shows minimal right internal carotid artery disease greater than 80% left internal carotid artery stenosis.  She had a repeat duplex of her left sided lesion today.  This again confirms greater than 80% stenosis on the left side.  ASSESSMENT: #1 peripheral arterial disease most likely asymptomatic with abnormal right femoral pulse.  We will obtain ABIs to see where she is perfusion wise.  2.  Widened aortic pulsation will obtain aortic ultrasound to rule out abdominal aortic aneurysm  3.  Greater than 80%  left internal carotid artery stenosis.  Difficult to know if her dizziness symptoms are related to this.  However certainly she is at risk for stroke.  We will obtain an MRI of the brain to rule out recent stroke.  She will continue her aspirin and statin.  I have scheduled her for left carotid endarterectomy in the near future for stroke prophylaxis.  Risk benefits possible complications of procedure details including but not limited to bleeding infection stroke risk of 1 to 2% cranial nerve injury risk usually transient 10%.  She understands and agrees to proceed.   PLAN: See above   Ruta Hinds, MD [facility] Office: [phone] Pager: [phone]

## 2018-07-03 ENCOUNTER — Telehealth: Payer: Self-pay | Admitting: *Deleted

## 2018-07-03 NOTE — Telephone Encounter (Signed)
Call to patient and instructed to call [facility] imaging to schedule MRI. Order is in [place]. Test needs to be done with in a week. Verbalized understanding.

## 2018-07-08 ENCOUNTER — Other Ambulatory Visit: Payer: Self-pay

## 2018-07-08 ENCOUNTER — Ambulatory Visit
Admission: RE | Admit: 2018-07-08 | Discharge: 2018-07-08 | Disposition: A | Discharge status: Home / Self Care | Payer: Medicare Other | Source: Ambulatory Visit | Attending: Vascular Surgery | Admitting: Vascular Surgery

## 2018-07-08 DIAGNOSIS — G3281 Cerebellar ataxia in diseases classified elsewhere: Secondary | ICD-10-CM

## 2018-07-09 ENCOUNTER — Other Ambulatory Visit: Payer: Self-pay

## 2018-07-09 ENCOUNTER — Ambulatory Visit (INDEPENDENT_AMBULATORY_CARE_PROVIDER_SITE_OTHER)
Admission: RE | Admit: 2018-07-09 | Discharge: 2018-07-09 | Disposition: A | Discharge status: Home / Self Care | Payer: Medicare Other | Source: Ambulatory Visit | Attending: Vascular Surgery | Admitting: Vascular Surgery

## 2018-07-09 ENCOUNTER — Encounter: Payer: Self-pay | Admitting: Vascular Surgery

## 2018-07-09 ENCOUNTER — Ambulatory Visit (INDEPENDENT_AMBULATORY_CARE_PROVIDER_SITE_OTHER): Payer: Medicare Other | Admitting: Vascular Surgery

## 2018-07-09 ENCOUNTER — Ambulatory Visit (HOSPITAL_COMMUNITY)
Admission: RE | Admit: 2018-07-09 | Discharge: 2018-07-09 | Disposition: A | Discharge status: Home / Self Care | Payer: Medicare Other | Source: Ambulatory Visit | Attending: Vascular Surgery | Admitting: Vascular Surgery

## 2018-07-09 VITALS — BP 155/87 | HR 79 | Temp 97.3°F | Resp 16 | Ht 61.0 in | Wt 137.6 lb

## 2018-07-09 DIAGNOSIS — I739 Peripheral vascular disease, unspecified: Secondary | ICD-10-CM

## 2018-07-09 DIAGNOSIS — I6522 Occlusion and stenosis of left carotid artery: Secondary | ICD-10-CM

## 2018-07-09 DIAGNOSIS — R0989 Other specified symptoms and signs involving the circulatory and respiratory systems: Secondary | ICD-10-CM

## 2018-07-09 DIAGNOSIS — I714 Abdominal aortic aneurysm, without rupture, unspecified: Secondary | ICD-10-CM

## 2018-07-09 NOTE — H&P (View-Only) (Signed)
Patient name: Kathy Becker MRN: [id number] DOB: [birth year] Sex: female    HPI: Kathy Becker is a 83 y.o. female, who returns for follow-up today.  She is scheduled to have a left carotid endarterectomy March 23.  She has not had any symptoms of TIA amaurosis or stroke since her last office visit.  She was scheduled to have an MRI of her brain to rule out stroke yesterday.  However she was not able to tolerate the test due to claustrophobia.  He also had a widened aortic pulsation at her last office visit and a abnormal right femoral pulse.  Today she had an aortic ultrasound and ABIs to evaluate this further.  She is on aspirin and a statin.  Past Medical History:  Diagnosis Date  . CAD (coronary artery disease)    stress test 08-15-08 showing lateral scar but no reversible ischemia   . Hyperlipidemia   . Hypertension   . Myocardial infarction ([place])   . Renal insufficiency    Past Surgical History:  Procedure Laterality Date  . BACK SURGERY  1988   lumbar spine   . CATARACT EXTRACTION, BILATERAL     per Dr. Katy Fitch   . COLONOSCOPY  never   she refuses to ever get one     Family History  Problem Relation Age of Onset  . Coronary artery disease Other     SOCIAL HISTORY: Social History   Socioeconomic History  . Marital status: Legally Separated    Spouse name: Not on file  . Number of children: Not on file  . Years of education: Not on file  . Highest education level: Not on file  Occupational History  . Not on file  Social Needs  . Financial resource strain: Not on file  . Food insecurity:    Worry: Not on file    Inability: Not on file  . Transportation needs:    Medical: Not on file    Non-medical: Not on file  Tobacco Use  . Smoking status: Never Smoker  . Smokeless tobacco: Never Used  Substance and Sexual Activity  . Alcohol use: No    Alcohol/week: 0.0 standard drinks  . Drug use: No  . Sexual activity: Not on file  Lifestyle  . Physical activity:   Days per week: Not on file    Minutes per session: Not on file  . Stress: Not on file  Relationships  . Social connections:    Talks on phone: Not on file    Gets together: Not on file    Attends religious service: Not on file    Active member of club or organization: Not on file    Attends meetings of clubs or organizations: Not on file    Relationship status: Not on file  . Intimate partner violence:    Fear of current or ex partner: Not on file    Emotionally abused: Not on file    Physically abused: Not on file    Forced sexual activity: Not on file  Other Topics Concern  . Not on file  Social History Narrative  . Not on file    Allergies  Allergen Reactions  . Sulfonamide Derivatives     Confusion    Current Outpatient Medications  Medication Sig Dispense Refill  . amLODipine (NORVASC) 5 MG tablet TAKE 1 TABLET(5 MG) BY MOUTH DAILY (Patient taking differently: Take 5 mg by mouth daily. ) 30 tablet 11  . aspirin 81 MG tablet Take  81 mg by mouth at bedtime.     Marland Kitchen doxycycline (PERIOSTAT) 20 MG tablet Take 20 mg by mouth daily.    . Glucosamine HCl (GLUCOSAMINE PO) Take 2 tablets by mouth daily.    Marland Kitchen lisinopril (PRINIVIL,ZESTRIL) 20 MG tablet TAKE 1 TABLET BY MOUTH EVERY DAY (Patient taking differently: Take 20 mg by mouth daily. ) 30 tablet 11  . pravastatin (PRAVACHOL) 40 MG tablet TAKE 1 TABLET(40 MG) BY MOUTH DAILY (Patient taking differently: Take 40 mg by mouth daily. ) 30 tablet 11  . triamcinolone cream (KENALOG) 0.1 % Apply 1 application topically daily as needed (rash).     No current facility-administered medications for this visit.     ROS:    Cardiac: No recent episodes of chest pain/pressure, no shortness of breath at rest.  No shortness of breath with exertion.  Denies history of atrial fibrillation or irregular heartbeat  Vascular: No history of rest pain in feet.  No history of claudication.  No history of non-healing ulcer, No history of DVT    Pulmonary: No home oxygen, no productive cough, no hemoptysis,  No asthma or wheezing   Physical Examination  There were no vitals filed for this visit.  There is no height or weight on file to calculate BMI.  General:  Alert and oriented, no acute distress HEENT: Normal Cardiac: Regular Rate and Rhythm  Neurologic: Upper and lower extremity motor 5/5 and symmetric, facial asymmetry  DATA:  Ultrasound of her abdominal aorta shows a small abdominal aortic aneurysm 2.94 cm in diameter  Bilateral ABIs were 0.92 on the right 1.05 on the left  ASSESSMENT: 1.  Asymptomatic greater than 80% left internal carotid artery stenosis.  She is scheduled for left carotid endarterectomy March 23.  Since she did not tolerate the MRI of the brain we will try to get her at least scheduled for a noncontrast head CT.  This is to rule out any prior stroke and to make sure that she does not have another etiology for her dizziness symptoms.  2.  Small abdominal aortic aneurysm 2.9 cm diameter follow-up ultrasound in 1 year  3.  Mild peripheral arterial disease currently asymptomatic would repeat ABIs if she develops claudication symptoms or nonhealing wounds on her feet otherwise no further follow-up for this currently.  Ruta Hinds, MD [facility] Office: [phone] Pager: [phone]

## 2018-07-09 NOTE — Progress Notes (Signed)
Patient name: Kathy Becker MRN: [id number] DOB: [birth year] Sex: female    HPI: Kathy Becker is a 83 y.o. female, who returns for follow-up today.  She is scheduled to have a left carotid endarterectomy March 23.  She has not had any symptoms of TIA amaurosis or stroke since her last office visit.  She was scheduled to have an MRI of her brain to rule out stroke yesterday.  However she was not able to tolerate the test due to claustrophobia.  He also had a widened aortic pulsation at her last office visit and a abnormal right femoral pulse.  Today she had an aortic ultrasound and ABIs to evaluate this further.  She is on aspirin and a statin.  Past Medical History:  Diagnosis Date  . CAD (coronary artery disease)    stress test 08-15-08 showing lateral scar but no reversible ischemia   . Hyperlipidemia   . Hypertension   . Myocardial infarction ([place])   . Renal insufficiency    Past Surgical History:  Procedure Laterality Date  . BACK SURGERY  1988   lumbar spine   . CATARACT EXTRACTION, BILATERAL     per Dr. Katy Fitch   . COLONOSCOPY  never   she refuses to ever get one     Family History  Problem Relation Age of Onset  . Coronary artery disease Other     SOCIAL HISTORY: Social History   Socioeconomic History  . Marital status: Legally Separated    Spouse name: Not on file  . Number of children: Not on file  . Years of education: Not on file  . Highest education level: Not on file  Occupational History  . Not on file  Social Needs  . Financial resource strain: Not on file  . Food insecurity:    Worry: Not on file    Inability: Not on file  . Transportation needs:    Medical: Not on file    Non-medical: Not on file  Tobacco Use  . Smoking status: Never Smoker  . Smokeless tobacco: Never Used  Substance and Sexual Activity  . Alcohol use: No    Alcohol/week: 0.0 standard drinks  . Drug use: No  . Sexual activity: Not on file  Lifestyle  . Physical activity:   Days per week: Not on file    Minutes per session: Not on file  . Stress: Not on file  Relationships  . Social connections:    Talks on phone: Not on file    Gets together: Not on file    Attends religious service: Not on file    Active member of club or organization: Not on file    Attends meetings of clubs or organizations: Not on file    Relationship status: Not on file  . Intimate partner violence:    Fear of current or ex partner: Not on file    Emotionally abused: Not on file    Physically abused: Not on file    Forced sexual activity: Not on file  Other Topics Concern  . Not on file  Social History Narrative  . Not on file    Allergies  Allergen Reactions  . Sulfonamide Derivatives     Confusion    Current Outpatient Medications  Medication Sig Dispense Refill  . amLODipine (NORVASC) 5 MG tablet TAKE 1 TABLET(5 MG) BY MOUTH DAILY (Patient taking differently: Take 5 mg by mouth daily. ) 30 tablet 11  . aspirin 81 MG tablet Take  81 mg by mouth at bedtime.     Marland Kitchen doxycycline (PERIOSTAT) 20 MG tablet Take 20 mg by mouth daily.    . Glucosamine HCl (GLUCOSAMINE PO) Take 2 tablets by mouth daily.    Marland Kitchen lisinopril (PRINIVIL,ZESTRIL) 20 MG tablet TAKE 1 TABLET BY MOUTH EVERY DAY (Patient taking differently: Take 20 mg by mouth daily. ) 30 tablet 11  . pravastatin (PRAVACHOL) 40 MG tablet TAKE 1 TABLET(40 MG) BY MOUTH DAILY (Patient taking differently: Take 40 mg by mouth daily. ) 30 tablet 11  . triamcinolone cream (KENALOG) 0.1 % Apply 1 application topically daily as needed (rash).     No current facility-administered medications for this visit.     ROS:    Cardiac: No recent episodes of chest pain/pressure, no shortness of breath at rest.  No shortness of breath with exertion.  Denies history of atrial fibrillation or irregular heartbeat  Vascular: No history of rest pain in feet.  No history of claudication.  No history of non-healing ulcer, No history of DVT    Pulmonary: No home oxygen, no productive cough, no hemoptysis,  No asthma or wheezing   Physical Examination  There were no vitals filed for this visit.  There is no height or weight on file to calculate BMI.  General:  Alert and oriented, no acute distress HEENT: Normal Cardiac: Regular Rate and Rhythm  Neurologic: Upper and lower extremity motor 5/5 and symmetric, facial asymmetry  DATA:  Ultrasound of her abdominal aorta shows a small abdominal aortic aneurysm 2.94 cm in diameter  Bilateral ABIs were 0.92 on the right 1.05 on the left  ASSESSMENT: 1.  Asymptomatic greater than 80% left internal carotid artery stenosis.  She is scheduled for left carotid endarterectomy March 23.  Since she did not tolerate the MRI of the brain we will try to get her at least scheduled for a noncontrast head CT.  This is to rule out any prior stroke and to make sure that she does not have another etiology for her dizziness symptoms.  2.  Small abdominal aortic aneurysm 2.9 cm diameter follow-up ultrasound in 1 year  3.  Mild peripheral arterial disease currently asymptomatic would repeat ABIs if she develops claudication symptoms or nonhealing wounds on her feet otherwise no further follow-up for this currently.  Ruta Hinds, MD [facility] Office: [phone] Pager: [phone]

## 2018-07-10 ENCOUNTER — Encounter (HOSPITAL_COMMUNITY): Payer: Self-pay

## 2018-07-10 ENCOUNTER — Encounter (HOSPITAL_COMMUNITY)
Admission: RE | Admit: 2018-07-10 | Discharge: 2018-07-10 | Disposition: A | Discharge status: Home / Self Care | Payer: Medicare Other | Source: Ambulatory Visit | Attending: Vascular Surgery | Admitting: Vascular Surgery

## 2018-07-10 ENCOUNTER — Other Ambulatory Visit: Payer: Self-pay

## 2018-07-10 DIAGNOSIS — Z01812 Encounter for preprocedural laboratory examination: Secondary | ICD-10-CM | POA: Insufficient documentation

## 2018-07-10 LAB — COMPREHENSIVE METABOLIC PANEL
ALT: 14 U/L (ref 0–44)
AST: 20 U/L (ref 15–41)
Albumin: 3.6 g/dL (ref 3.5–5.0)
Alkaline Phosphatase: 110 U/L (ref 38–126)
Anion gap: 10 (ref 5–15)
BUN: 19 mg/dL (ref 8–23)
CO2: 23 mmol/L (ref 22–32)
Calcium: 9.3 mg/dL (ref 8.9–10.3)
Chloride: 107 mmol/L (ref 98–111)
Creatinine, Ser: 1.31 mg/dL — ABNORMAL HIGH (ref 0.44–1.00)
GFR calc Af Amer: 44 mL/min — ABNORMAL LOW (ref >60)
GFR calc non Af Amer: 38 mL/min — ABNORMAL LOW (ref >60)
Glucose, Bld: 88 mg/dL (ref 70–99)
Potassium: 3.4 mmol/L — ABNORMAL LOW (ref 3.5–5.1)
Sodium: 140 mmol/L (ref 135–145)
TOTAL PROTEIN: 6.8 g/dL (ref 6.5–8.1)
Total Bilirubin: 0.5 mg/dL (ref 0.3–1.2)

## 2018-07-10 LAB — URINALYSIS, ROUTINE W REFLEX MICROSCOPIC
Bilirubin Urine: NEGATIVE
Glucose, UA: NEGATIVE mg/dL
Hgb urine dipstick: NEGATIVE
Ketones, ur: NEGATIVE mg/dL
Nitrite: NEGATIVE
PROTEIN: NEGATIVE mg/dL
Specific Gravity, Urine: 1.025 (ref 1.005–1.030)
pH: 5 (ref 5.0–8.0)

## 2018-07-10 LAB — SURGICAL PCR SCREEN
MRSA, PCR: NEGATIVE
STAPHYLOCOCCUS AUREUS: NEGATIVE

## 2018-07-10 LAB — CBC
HEMATOCRIT: 42.5 % (ref 36.0–46.0)
Hemoglobin: 13.5 g/dL (ref 12.0–15.0)
MCH: 28.8 pg (ref 26.0–34.0)
MCHC: 31.8 g/dL (ref 30.0–36.0)
MCV: 90.8 fL (ref 80.0–100.0)
Platelets: 173 10*3/uL (ref 150–400)
RBC: 4.68 MIL/uL (ref 3.87–5.11)
RDW: 13.4 % (ref 11.5–15.5)
WBC: 7.7 10*3/uL (ref 4.0–10.5)
nRBC: 0 % (ref 0.0–0.2)

## 2018-07-10 LAB — ABO/RH: ABO/RH(D): A NEG

## 2018-07-10 LAB — PROTIME-INR
INR: 1 (ref 0.8–1.2)
Prothrombin Time: 12.9 seconds (ref 11.4–15.2)

## 2018-07-10 LAB — TYPE AND SCREEN
ABO/RH(D): A NEG
ANTIBODY SCREEN: NEGATIVE

## 2018-07-10 LAB — APTT: aPTT: 32 seconds (ref 24–36)

## 2018-07-10 NOTE — Pre-Procedure Instructions (Signed)
Kathy Becker  07/10/2018      [id number] - [place], [place] [address] [place] [address] Phone: [phone] Fax: [phone]    Your procedure is scheduled on Monday 07/20/18  Report to [facility]  at 0730 A.M.  Call this number if you have problems the morning of surgery:  [phone]   Remember:  Do not eat or drink after midnight.     Take these medicines the morning of surgery with A SIP OF WATER               AmLODipine (NORVASC)              Doxycycline (PERIOSTAT)              Pravastatin (PRAVACHOL)    Do not wear jewelry, make-up or nail polish.  Do not wear lotions, powders, or perfumes, or deodorant.  Do not shave 48 hours prior to surgery.    Do not bring valuables to the hospital.  7 days prior to surgery (07-13-18)STOP taking any Aspirin (unless otherwise instructed by your surgeon), Aleve, Naproxen, Ibuprofen, Motrin, Advil, Goody's, BC's, all herbal medications, fish oil, and all vitamins.  Follow your surgeon's instructions on when to stop Asprin.  If no instructions were given by your surgeon then you will need to call the office to get those instructions.     [facility] is not responsible for any belongings or valuables.  Contacts, dentures or bridgework may not be worn into surgery.  Leave your suitcase in the car.  After surgery it may be brought to your room.  For patients admitted to the hospital, discharge time will be determined by your treatment team.  Patients discharged the day of surgery will not be allowed to drive home.   Special instructions:  [facility]- Preparing For Surgery  Before surgery, you can play an important role. Because skin is not sterile, your skin needs to be as free of germs as possible. You can reduce the number of germs on your skin by washing with CHG (chlorahexidine gluconate) Soap before  surgery.  CHG is an antiseptic cleaner which kills germs and bonds with the skin to continue killing germs even after washing.    Oral Hygiene is also important to reduce your risk of infection.  Remember - BRUSH YOUR TEETH THE MORNING OF SURGERY WITH YOUR REGULAR TOOTHPASTE  Please do not use if you have an allergy to CHG or antibacterial soaps. If your skin becomes reddened/irritated stop using the CHG.  Do not shave (including legs and underarms) for at least 48 hours prior to first CHG shower. It is OK to shave your face.  Please follow these instructions carefully.   1. Shower the NIGHT BEFORE SURGERY and the MORNING OF SURGERY with CHG.   2. If you chose to wash your hair, wash your hair first as usual with your normal shampoo.  3. After you shampoo, rinse your hair and body thoroughly to remove the shampoo.  4. Use CHG as you would any other liquid soap. You can apply CHG directly to the skin and wash gently with a scrungie or a clean washcloth.   5. Apply the CHG Soap to your body ONLY FROM THE NECK DOWN.  Do not use on open wounds or open sores. Avoid contact with your eyes, ears, mouth and genitals (private parts). Wash  Face and genitals (private parts)  with your normal soap.  6. Wash thoroughly, paying special attention to the area where your surgery will be performed.  7. Thoroughly rinse your body with warm water from the neck down.  8. DO NOT shower/wash with your normal soap after using and rinsing off the CHG Soap.  9. Pat yourself dry with a CLEAN TOWEL.  10. Wear CLEAN PAJAMAS to bed the night before surgery, wear comfortable clothes the morning of surgery  11. Place CLEAN SHEETS on your bed the night of your first shower and DO NOT SLEEP WITH PETS.  Day of Surgery:  Do not apply any deodorants/lotions.  Please wear clean clothes to the hospital/surgery center.   Remember to brush your teeth WITH YOUR REGULAR TOOTHPASTE.  Please read over the following fact  sheets that you were given. Pain Booklet, Coughing and Deep Breathing, MRSA Information and Surgical Site Infection Prevention

## 2018-07-10 NOTE — Progress Notes (Signed)
Abnormal UA results relayed to [facility] from Dr. Nona Dell office.

## 2018-07-10 NOTE — Progress Notes (Signed)
PCP - Sarajane Jews  Cardiologist - Denies  Chest x-ray - 07/10/18   EKG - 06/22/18 (E)  Stress Test - Denies  ECHO - 06/23/18 (E)  Cardiac Cath - Denies  AICD- denies PM-denies LOOP-denies  Sleep Study - Denies CPAP -   LABS-CBC,CMP,APTT,UA.,T/S,PCR  ASA-Continue    Anesthesia- Y (Cardiac history)  Pt denies having chest pain, sob, or fever at this time. All instructions explained to the pt, with a verbal understanding of the material. Pt agrees to go over the instructions while at home for a better understanding. The opportunity to ask questions was provided.

## 2018-07-13 NOTE — Progress Notes (Signed)
Pt concerned that she is confused about if she will stay in the hospital after her procedure. She was also confused about when to stop taking some of her medicines prior to surgery. Pt instructed, as per the instructions.

## 2018-07-13 NOTE — Anesthesia Preprocedure Evaluation (Addendum)
Anesthesia Evaluation  Patient identified by MRN, date of birth, ID band Patient awake    Reviewed: Allergy & Precautions, NPO status , Patient's Chart, lab work & pertinent test results  Airway Mallampati: II  TM Distance: >3 FB Neck ROM: Full    Dental  (+) Dental Advisory Given   Pulmonary neg pulmonary ROS,    breath sounds clear to auscultation       Cardiovascular hypertension, Pt. on medications + CAD and + Past MI   Rhythm:Regular Rate:Normal     Neuro/Psych negative neurological ROS     GI/Hepatic negative GI ROS, Neg liver ROS,   Endo/Other  negative endocrine ROS  Renal/GU Renal disease     Musculoskeletal   Abdominal   Peds  Hematology negative hematology ROS (+)   Anesthesia Other Findings   Reproductive/Obstetrics                           Lab Results  Component Value Date   WBC 7.7 07/10/2018   HGB 13.5 07/10/2018   HCT 42.5 07/10/2018   MCV 90.8 07/10/2018   PLT 173 07/10/2018   Lab Results  Component Value Date   CREATININE 1.31 (H) 07/10/2018   BUN 19 07/10/2018   NA 140 07/10/2018   K 3.4 (L) 07/10/2018   CL 107 07/10/2018   CO2 23 07/10/2018    Anesthesia Physical Anesthesia Plan  ASA: III  Anesthesia Plan: General   Post-op Pain Management:    Induction: Intravenous  PONV Risk Score and Plan: 3 and Treatment may vary due to age or medical condition, Dexamethasone and Ondansetron  Airway Management Planned: Oral ETT  Additional Equipment: Arterial line  Intra-op Plan:   Post-operative Plan: Extubation in OR  Informed Consent: I have reviewed the patients History and Physical, chart, labs and discussed the procedure including the risks, benefits and alternatives for the proposed anesthesia with the patient or authorized representative who has indicated his/her understanding and acceptance.     Dental advisory given  Plan Discussed with:  CRNA  Anesthesia Plan Comments: (Recent echo and carotid US ordered by PCP to eval dizzy spells.  -Echo 06/23/18 showed EF 40-45%, no significant valvular stenosis or regurgitation. Dr. Sarajane Jews commented on the result "Normal valve function. The pumping action of her heart is slightly reduced, but this is not unexpected at her age. There is nothing here to explain her lightheaded spells."  -Carotid US 06/26/18 showed:  Right Carotid: Velocities in the right ICA are consistent with a 1-39% stenosis. Left Carotid: Velocities in the left bulb/proximal ICA are consistent with a 80-99% stenosis. Non-hemodynamically significant plaque noted in the CCA. The ECA appears >50% stenosed. Vertebrals:  Bilateral vertebral arteries demonstrate antegrade flow.  Subclavians: Left subclavian artery was stenotic. Normal flow hemodynamics were seen in the right subclavian artery.)      Anesthesia Quick Evaluation

## 2018-07-15 ENCOUNTER — Telehealth: Payer: Self-pay | Admitting: *Deleted

## 2018-07-15 ENCOUNTER — Other Ambulatory Visit: Payer: Self-pay

## 2018-07-15 ENCOUNTER — Ambulatory Visit
Admission: RE | Admit: 2018-07-15 | Discharge: 2018-07-15 | Disposition: A | Discharge status: Home / Self Care | Payer: Medicare Other | Source: Ambulatory Visit | Attending: Vascular Surgery | Admitting: Vascular Surgery

## 2018-07-15 ENCOUNTER — Other Ambulatory Visit: Payer: Self-pay | Admitting: Vascular Surgery

## 2018-07-15 DIAGNOSIS — R42 Dizziness and giddiness: Secondary | ICD-10-CM | POA: Diagnosis not present

## 2018-07-15 NOTE — Telephone Encounter (Signed)
Patient called and instructed to be at [facility] admitting at 5:30 am on 06/21/2018 for surgery. All other pre-op instructions unchanged. Verbalized understanding.

## 2018-07-20 ENCOUNTER — Encounter (HOSPITAL_COMMUNITY)
Admission: RE | Disposition: A | Discharge status: Home / Self Care | Payer: Self-pay | Source: Home / Self Care | Attending: Vascular Surgery

## 2018-07-20 ENCOUNTER — Encounter (HOSPITAL_COMMUNITY): Payer: Self-pay

## 2018-07-20 ENCOUNTER — Inpatient Hospital Stay (HOSPITAL_COMMUNITY)
Admission: RE | Admit: 2018-07-20 | Discharge: 2018-07-21 | DRG: 039 | Disposition: A | Discharge status: Home / Self Care | Payer: Medicare Other | Attending: Vascular Surgery | Admitting: Vascular Surgery

## 2018-07-20 ENCOUNTER — Inpatient Hospital Stay (HOSPITAL_COMMUNITY): Payer: Medicare Other | Admitting: Certified Registered Nurse Anesthetist

## 2018-07-20 ENCOUNTER — Inpatient Hospital Stay (HOSPITAL_COMMUNITY): Payer: Medicare Other | Admitting: Physician Assistant

## 2018-07-20 ENCOUNTER — Other Ambulatory Visit: Payer: Self-pay

## 2018-07-20 DIAGNOSIS — I2510 Atherosclerotic heart disease of native coronary artery without angina pectoris: Secondary | ICD-10-CM | POA: Diagnosis present

## 2018-07-20 DIAGNOSIS — Z8249 Family history of ischemic heart disease and other diseases of the circulatory system: Secondary | ICD-10-CM

## 2018-07-20 DIAGNOSIS — Z882 Allergy status to sulfonamides status: Secondary | ICD-10-CM

## 2018-07-20 DIAGNOSIS — I6522 Occlusion and stenosis of left carotid artery: Secondary | ICD-10-CM | POA: Diagnosis not present

## 2018-07-20 DIAGNOSIS — I714 Abdominal aortic aneurysm, without rupture: Secondary | ICD-10-CM | POA: Diagnosis present

## 2018-07-20 DIAGNOSIS — I129 Hypertensive chronic kidney disease with stage 1 through stage 4 chronic kidney disease, or unspecified chronic kidney disease: Secondary | ICD-10-CM | POA: Diagnosis present

## 2018-07-20 DIAGNOSIS — I252 Old myocardial infarction: Secondary | ICD-10-CM

## 2018-07-20 DIAGNOSIS — I739 Peripheral vascular disease, unspecified: Secondary | ICD-10-CM | POA: Diagnosis not present

## 2018-07-20 DIAGNOSIS — Z79899 Other long term (current) drug therapy: Secondary | ICD-10-CM | POA: Diagnosis not present

## 2018-07-20 DIAGNOSIS — Z7982 Long term (current) use of aspirin: Secondary | ICD-10-CM | POA: Diagnosis not present

## 2018-07-20 DIAGNOSIS — E785 Hyperlipidemia, unspecified: Secondary | ICD-10-CM | POA: Diagnosis not present

## 2018-07-20 DIAGNOSIS — N183 Chronic kidney disease, stage 3 (moderate): Secondary | ICD-10-CM | POA: Diagnosis present

## 2018-07-20 HISTORY — PX: ENDARTERECTOMY: SHX5162

## 2018-07-20 HISTORY — PX: PATCH ANGIOPLASTY: SHX6230

## 2018-07-20 SURGERY — ENDARTERECTOMY, CAROTID
Anesthesia: General | Site: Neck | Laterality: Left

## 2018-07-20 MED ORDER — CEFAZOLIN SODIUM-DEXTROSE 2-4 GM/100ML-% IV SOLN
2.0000 g | Freq: Three times a day (TID) | INTRAVENOUS | Status: AC
  Administered 2018-07-20 (×2): 2 g via INTRAVENOUS
  Filled 2018-07-20 (×2): qty 100

## 2018-07-20 MED ORDER — MAGNESIUM SULFATE 2 GM/50ML IV SOLN: 2.0000 g | Freq: Every day | INTRAVENOUS | Status: DC | PRN

## 2018-07-20 MED ORDER — ROCURONIUM BROMIDE 50 MG/5ML IV SOSY
PREFILLED_SYRINGE | INTRAVENOUS | Status: AC
  Filled 2018-07-20: qty 5

## 2018-07-20 MED ORDER — SODIUM CHLORIDE 0.9 % IV SOLN
0.0125 ug/kg/min | INTRAVENOUS | Status: AC
  Administered 2018-07-20: .1 ug/kg/min via INTRAVENOUS
  Filled 2018-07-20: qty 2000

## 2018-07-20 MED ORDER — LISINOPRIL 10 MG PO TABS
20.0000 mg | ORAL_TABLET | Freq: Every day | ORAL | Status: DC
  Administered 2018-07-20 – 2018-07-21 (×2): 20 mg via ORAL
  Filled 2018-07-20 (×2): qty 2

## 2018-07-20 MED ORDER — CEFAZOLIN SODIUM-DEXTROSE 2-4 GM/100ML-% IV SOLN
INTRAVENOUS | Status: AC
  Filled 2018-07-20: qty 100

## 2018-07-20 MED ORDER — LIDOCAINE HCL 4 % MT SOLN
OROMUCOSAL | Status: DC | PRN
  Administered 2018-07-20: 5 mL via TOPICAL

## 2018-07-20 MED ORDER — SUGAMMADEX SODIUM 200 MG/2ML IV SOLN
INTRAVENOUS | Status: DC | PRN
  Administered 2018-07-20: 150 mg via INTRAVENOUS

## 2018-07-20 MED ORDER — SODIUM CHLORIDE 0.9 % IV SOLN: INTRAVENOUS | Status: DC

## 2018-07-20 MED ORDER — PROPOFOL 10 MG/ML IV BOLUS
INTRAVENOUS | Status: DC | PRN
  Administered 2018-07-20: 30 mg via INTRAVENOUS
  Administered 2018-07-20: 100 mg via INTRAVENOUS
  Administered 2018-07-20: 20 mg via INTRAVENOUS

## 2018-07-20 MED ORDER — ACETAMINOPHEN 325 MG PO TABS
325.0000 mg | ORAL_TABLET | ORAL | Status: DC | PRN
  Administered 2018-07-20 – 2018-07-21 (×2): 650 mg via ORAL
  Filled 2018-07-20 (×2): qty 2

## 2018-07-20 MED ORDER — ROCURONIUM BROMIDE 10 MG/ML (PF) SYRINGE
PREFILLED_SYRINGE | INTRAVENOUS | Status: DC | PRN
  Administered 2018-07-20: 30 mg via INTRAVENOUS
  Administered 2018-07-20: 10 mg via INTRAVENOUS
  Administered 2018-07-20: 20 mg via INTRAVENOUS

## 2018-07-20 MED ORDER — PROTAMINE SULFATE 10 MG/ML IV SOLN
INTRAVENOUS | Status: AC
  Filled 2018-07-20: qty 5

## 2018-07-20 MED ORDER — FENTANYL CITRATE (PF) 100 MCG/2ML IJ SOLN: 25.0000 ug | INTRAMUSCULAR | Status: DC | PRN

## 2018-07-20 MED ORDER — LACTATED RINGERS IV SOLN
INTRAVENOUS | Status: DC | PRN
  Administered 2018-07-20: 07:00:00 via INTRAVENOUS

## 2018-07-20 MED ORDER — PHENYLEPHRINE 40 MCG/ML (10ML) SYRINGE FOR IV PUSH (FOR BLOOD PRESSURE SUPPORT)
PREFILLED_SYRINGE | INTRAVENOUS | Status: DC | PRN
  Administered 2018-07-20: 80 ug via INTRAVENOUS

## 2018-07-20 MED ORDER — PROTAMINE SULFATE 10 MG/ML IV SOLN
INTRAVENOUS | Status: DC | PRN
  Administered 2018-07-20: 50 mg via INTRAVENOUS

## 2018-07-20 MED ORDER — BISACODYL 10 MG RE SUPP: 10.0000 mg | Freq: Every day | RECTAL | Status: DC | PRN

## 2018-07-20 MED ORDER — CHLORHEXIDINE GLUCONATE 4 % EX LIQD: 60.0000 mL | Freq: Once | CUTANEOUS | Status: DC

## 2018-07-20 MED ORDER — GUAIFENESIN-DM 100-10 MG/5ML PO SYRP: 15.0000 mL | ORAL_SOLUTION | ORAL | Status: DC | PRN

## 2018-07-20 MED ORDER — SODIUM CHLORIDE 0.9 % IV SOLN
INTRAVENOUS | Status: DC | PRN
  Administered 2018-07-20: 25 ug/min via INTRAVENOUS

## 2018-07-20 MED ORDER — LIDOCAINE 2% (20 MG/ML) 5 ML SYRINGE
INTRAMUSCULAR | Status: DC | PRN
  Administered 2018-07-20: 60 mg via INTRAVENOUS

## 2018-07-20 MED ORDER — AMLODIPINE BESYLATE 5 MG PO TABS
5.0000 mg | ORAL_TABLET | Freq: Every day | ORAL | Status: DC
  Administered 2018-07-21: 5 mg via ORAL
  Filled 2018-07-20: qty 1

## 2018-07-20 MED ORDER — METOPROLOL TARTRATE 5 MG/5ML IV SOLN: 2.0000 mg | INTRAVENOUS | Status: DC | PRN

## 2018-07-20 MED ORDER — TRIAMCINOLONE ACETONIDE 0.1 % EX CREA
1.0000 "application " | TOPICAL_CREAM | Freq: Every day | CUTANEOUS | Status: DC | PRN
  Filled 2018-07-20: qty 15

## 2018-07-20 MED ORDER — PRAVASTATIN SODIUM 40 MG PO TABS
40.0000 mg | ORAL_TABLET | Freq: Every day | ORAL | Status: DC
  Administered 2018-07-21: 40 mg via ORAL
  Filled 2018-07-20: qty 1

## 2018-07-20 MED ORDER — DOCUSATE SODIUM 100 MG PO CAPS
100.0000 mg | ORAL_CAPSULE | Freq: Every day | ORAL | Status: DC
  Administered 2018-07-21: 100 mg via ORAL
  Filled 2018-07-20: qty 1

## 2018-07-20 MED ORDER — OXYCODONE-ACETAMINOPHEN 5-325 MG PO TABS
ORAL_TABLET | ORAL | Status: AC
  Filled 2018-07-20: qty 1

## 2018-07-20 MED ORDER — FENTANYL CITRATE (PF) 250 MCG/5ML IJ SOLN
INTRAMUSCULAR | Status: AC
  Filled 2018-07-20: qty 5

## 2018-07-20 MED ORDER — ASPIRIN EC 81 MG PO TBEC
81.0000 mg | DELAYED_RELEASE_TABLET | Freq: Every day | ORAL | Status: DC
  Administered 2018-07-20: 81 mg via ORAL
  Filled 2018-07-20: qty 1

## 2018-07-20 MED ORDER — POLYETHYLENE GLYCOL 3350 17 G PO PACK: 17.0000 g | PACK | Freq: Every day | ORAL | Status: DC | PRN

## 2018-07-20 MED ORDER — LABETALOL HCL 5 MG/ML IV SOLN: 10.0000 mg | INTRAVENOUS | Status: DC | PRN

## 2018-07-20 MED ORDER — PROMETHAZINE HCL 25 MG/ML IJ SOLN: 6.2500 mg | INTRAMUSCULAR | Status: DC | PRN

## 2018-07-20 MED ORDER — ESMOLOL HCL 100 MG/10ML IV SOLN
INTRAVENOUS | Status: DC | PRN
  Administered 2018-07-20: 20 mg via INTRAVENOUS

## 2018-07-20 MED ORDER — SODIUM CHLORIDE 0.9 % IV SOLN
INTRAVENOUS | Status: AC
  Filled 2018-07-20: qty 1.2

## 2018-07-20 MED ORDER — PANTOPRAZOLE SODIUM 40 MG PO TBEC
40.0000 mg | DELAYED_RELEASE_TABLET | Freq: Every day | ORAL | Status: DC
  Administered 2018-07-20 – 2018-07-21 (×2): 40 mg via ORAL
  Filled 2018-07-20 (×2): qty 1

## 2018-07-20 MED ORDER — SODIUM CHLORIDE 0.9 % IV SOLN: 500.0000 mL | Freq: Once | INTRAVENOUS | Status: DC | PRN

## 2018-07-20 MED ORDER — SUCCINYLCHOLINE CHLORIDE 200 MG/10ML IV SOSY
PREFILLED_SYRINGE | INTRAVENOUS | Status: AC
  Filled 2018-07-20: qty 10

## 2018-07-20 MED ORDER — SODIUM CHLORIDE 0.9 % IV SOLN
INTRAVENOUS | Status: DC | PRN
  Administered 2018-07-20: 500 mL

## 2018-07-20 MED ORDER — LIDOCAINE 2% (20 MG/ML) 5 ML SYRINGE
INTRAMUSCULAR | Status: AC
  Filled 2018-07-20: qty 10

## 2018-07-20 MED ORDER — PHENYLEPHRINE 40 MCG/ML (10ML) SYRINGE FOR IV PUSH (FOR BLOOD PRESSURE SUPPORT)
PREFILLED_SYRINGE | INTRAVENOUS | Status: AC
  Filled 2018-07-20: qty 10

## 2018-07-20 MED ORDER — HYDRALAZINE HCL 20 MG/ML IJ SOLN: 5.0000 mg | INTRAMUSCULAR | Status: DC | PRN

## 2018-07-20 MED ORDER — EPHEDRINE SULFATE-NACL 50-0.9 MG/10ML-% IV SOSY
PREFILLED_SYRINGE | INTRAVENOUS | Status: DC | PRN
  Administered 2018-07-20 (×2): 5 mg via INTRAVENOUS

## 2018-07-20 MED ORDER — ONDANSETRON HCL 4 MG/2ML IJ SOLN
INTRAMUSCULAR | Status: DC | PRN
  Administered 2018-07-20: 4 mg via INTRAVENOUS

## 2018-07-20 MED ORDER — EPHEDRINE 5 MG/ML INJ
INTRAVENOUS | Status: AC
  Filled 2018-07-20: qty 10

## 2018-07-20 MED ORDER — OXYCODONE-ACETAMINOPHEN 5-325 MG PO TABS
1.0000 | ORAL_TABLET | ORAL | Status: DC | PRN
  Administered 2018-07-20: 1 via ORAL

## 2018-07-20 MED ORDER — PHENOL 1.4 % MT LIQD: 1.0000 | OROMUCOSAL | Status: DC | PRN

## 2018-07-20 MED ORDER — ACETAMINOPHEN 325 MG RE SUPP: 325.0000 mg | RECTAL | Status: DC | PRN

## 2018-07-20 MED ORDER — SUCCINYLCHOLINE CHLORIDE 200 MG/10ML IV SOSY
PREFILLED_SYRINGE | INTRAVENOUS | Status: DC | PRN
  Administered 2018-07-20: 80 mg via INTRAVENOUS

## 2018-07-20 MED ORDER — POTASSIUM CHLORIDE CRYS ER 20 MEQ PO TBCR: 20.0000 meq | EXTENDED_RELEASE_TABLET | Freq: Every day | ORAL | Status: DC | PRN

## 2018-07-20 MED ORDER — DOXYCYCLINE HYCLATE 20 MG PO TABS: 20.0000 mg | ORAL_TABLET | Freq: Every day | ORAL | Status: DC

## 2018-07-20 MED ORDER — ALUM & MAG HYDROXIDE-SIMETH 200-200-20 MG/5ML PO SUSP: 15.0000 mL | ORAL | Status: DC | PRN

## 2018-07-20 MED ORDER — HEPARIN SODIUM (PORCINE) 1000 UNIT/ML IJ SOLN
INTRAMUSCULAR | Status: DC | PRN
  Administered 2018-07-20: 6000 [IU] via INTRAVENOUS

## 2018-07-20 MED ORDER — MORPHINE SULFATE (PF) 2 MG/ML IV SOLN: 2.0000 mg | INTRAVENOUS | Status: DC | PRN

## 2018-07-20 MED ORDER — ESMOLOL HCL 100 MG/10ML IV SOLN
INTRAVENOUS | Status: AC
  Filled 2018-07-20: qty 10

## 2018-07-20 MED ORDER — ONDANSETRON HCL 4 MG/2ML IJ SOLN: 4.0000 mg | Freq: Four times a day (QID) | INTRAMUSCULAR | Status: DC | PRN

## 2018-07-20 MED ORDER — CEFAZOLIN SODIUM-DEXTROSE 2-4 GM/100ML-% IV SOLN
2.0000 g | INTRAVENOUS | Status: AC
  Administered 2018-07-20: 2 g via INTRAVENOUS

## 2018-07-20 MED ORDER — 0.9 % SODIUM CHLORIDE (POUR BTL) OPTIME
TOPICAL | Status: DC | PRN
  Administered 2018-07-20: 2000 mL

## 2018-07-20 MED ORDER — FENTANYL CITRATE (PF) 250 MCG/5ML IJ SOLN
INTRAMUSCULAR | Status: DC | PRN
  Administered 2018-07-20: 50 ug via INTRAVENOUS
  Administered 2018-07-20 (×2): 25 ug via INTRAVENOUS

## 2018-07-20 MED ORDER — ONDANSETRON HCL 4 MG/2ML IJ SOLN
INTRAMUSCULAR | Status: AC
  Filled 2018-07-20: qty 2

## 2018-07-20 MED ORDER — DEXAMETHASONE SODIUM PHOSPHATE 10 MG/ML IJ SOLN
INTRAMUSCULAR | Status: AC
  Filled 2018-07-20: qty 1

## 2018-07-20 MED ORDER — LIDOCAINE HCL (PF) 1 % IJ SOLN
INTRAMUSCULAR | Status: AC
  Filled 2018-07-20: qty 30

## 2018-07-20 MED ORDER — PROPOFOL 10 MG/ML IV BOLUS
INTRAVENOUS | Status: AC
  Filled 2018-07-20: qty 40

## 2018-07-20 MED ORDER — DEXAMETHASONE SODIUM PHOSPHATE 10 MG/ML IJ SOLN
INTRAMUSCULAR | Status: DC | PRN
  Administered 2018-07-20: 5 mg via INTRAVENOUS

## 2018-07-20 SURGICAL SUPPLY — 38 items
CANISTER SUCT 3000ML PPV (MISCELLANEOUS) ×2 IMPLANT
CANNULA VESSEL 3MM 2 BLNT TIP (CANNULA) ×4 IMPLANT
CATH ROBINSON RED A/P 18FR (CATHETERS) ×2 IMPLANT
CLIP VESOCCLUDE MED 6/CT (CLIP) ×2 IMPLANT
CLIP VESOCCLUDE SM WIDE 6/CT (CLIP) ×2 IMPLANT
COVER WAND RF STERILE (DRAPES) ×2 IMPLANT
CRADLE DONUT ADULT HEAD (MISCELLANEOUS) ×2 IMPLANT
DECANTER SPIKE VIAL GLASS SM (MISCELLANEOUS) IMPLANT
DERMABOND ADVANCED (GAUZE/BANDAGES/DRESSINGS) ×1
DERMABOND ADVANCED .7 DNX12 (GAUZE/BANDAGES/DRESSINGS) ×1 IMPLANT
DRAIN HEMOVAC 1/8 X 5 (WOUND CARE) IMPLANT
ELECT REM PT RETURN 9FT ADLT (ELECTROSURGICAL) ×2
ELECTRODE REM PT RTRN 9FT ADLT (ELECTROSURGICAL) ×1 IMPLANT
EVACUATOR SILICONE 100CC (DRAIN) IMPLANT
GLOVE BIO SURGEON STRL SZ7.5 (GLOVE) ×2 IMPLANT
GOWN STRL REUS W/ TWL LRG LVL3 (GOWN DISPOSABLE) ×3 IMPLANT
GOWN STRL REUS W/TWL LRG LVL3 (GOWN DISPOSABLE) ×3
HEMOSTAT SPONGE AVITENE ULTRA (HEMOSTASIS) IMPLANT
KIT BASIN OR (CUSTOM PROCEDURE TRAY) ×2 IMPLANT
KIT SHUNT ARGYLE CAROTID ART 6 (VASCULAR PRODUCTS) IMPLANT
KIT TURNOVER KIT B (KITS) ×2 IMPLANT
NEEDLE HYPO 25GX1X1/2 BEV (NEEDLE) IMPLANT
NS IRRIG 1000ML POUR BTL (IV SOLUTION) ×4 IMPLANT
PACK CAROTID (CUSTOM PROCEDURE TRAY) ×2 IMPLANT
PAD ARMBOARD 7.5X6 YLW CONV (MISCELLANEOUS) ×4 IMPLANT
PATCH HEMASHIELD 8X75 (Vascular Products) ×2 IMPLANT
SHUNT CAROTID BYPASS 10 (VASCULAR PRODUCTS) ×2 IMPLANT
SHUNT CAROTID BYPASS 12FRX15.5 (VASCULAR PRODUCTS) IMPLANT
SUT ETHILON 3 0 PS 1 (SUTURE) IMPLANT
SUT PROLENE 6 0 CC (SUTURE) ×2 IMPLANT
SUT SILK 3 0 TIES 17X18 (SUTURE)
SUT SILK 3-0 18XBRD TIE BLK (SUTURE) IMPLANT
SUT VIC AB 3-0 SH 27 (SUTURE) ×1
SUT VIC AB 3-0 SH 27X BRD (SUTURE) ×1 IMPLANT
SUT VICRYL 4-0 PS2 18IN ABS (SUTURE) ×2 IMPLANT
SYR CONTROL 10ML LL (SYRINGE) IMPLANT
TOWEL GREEN STERILE (TOWEL DISPOSABLE) ×2 IMPLANT
WATER STERILE IRR 1000ML POUR (IV SOLUTION) ×2 IMPLANT

## 2018-07-20 NOTE — Discharge Instructions (Signed)
° °  [facility] ° °Discharge Instructions °  °Carotid Endarterectomy (CEA) ° °Please refer to the following instructions for your post-procedure care. Your surgeon or physician assistant will discuss any changes with you. ° °Activity ° °You are encouraged to walk as much as you can. You can slowly return to normal activities but must avoid strenuous activity and heavy lifting until your doctor tell you it's okay. Avoid activities such as vacuuming or swinging a golf club. You can drive after one week if you are comfortable and you are no longer taking prescription pain medications. It is normal to feel tired for serval weeks after your surgery. It is also normal to have difficulty with sleep habits, eating, and bowel movements after surgery. These will go away with time. ° °Bathing/Showering ° °Shower daily after you go home. Do not soak in a bathtub, hot tub, or swim until the incision heals completely. ° °Incision Care ° °Shower every day. Clean your incision with mild soap and water. Pat the area dry with a clean towel. You do not need a bandage unless otherwise instructed. Do not apply any ointments or creams to your incision. You may have skin glue on your incision. Do not peel it off. It will come off on its own in about one week. Your incision may feel thickened and raised for several weeks after your surgery. This is normal and the skin will soften over time.  ° °For Men Only: It's okay to shave around the incision but do not shave the incision itself for 2 weeks. It is common to have numbness under your chin that could last for several months. ° °Diet ° °Resume your normal diet. There are no special food restrictions following this procedure. A low fat/low cholesterol diet is recommended for all patients with vascular disease. In order to heal from your surgery, it is CRITICAL to get adequate nutrition. Your body requires vitamins, minerals, and protein. Vegetables are the  best source of vitamins and minerals. Vegetables also provide the perfect balance of protein. Processed food has little nutritional value, so try to avoid this. ° °Medications ° °Resume taking all of your medications unless your doctor or physician assistant tells you not to. If your incision is causing pain, you may take over-the- counter pain relievers such as acetaminophen (Tylenol). If you were prescribed a stronger pain medication, please be aware these medications can cause nausea and constipation. Prevent nausea by taking the medication with a snack or meal. Avoid constipation by drinking plenty of fluids and eating foods with a high amount of fiber, such as fruits, vegetables, and grains.  °Do not take Tylenol if you are taking prescription pain medications. ° °Follow Up ° °Our office will schedule a follow up appointment 2-3 weeks following discharge. ° °Please call us immediately for any of the following conditions ° °Increased pain, redness, drainage (pus) from your incision site. °Fever of 101 degrees or higher. °If you should develop stroke (slurred speech, difficulty swallowing, weakness on one side of your body, loss of vision) you should call 911 and go to the nearest emergency room. ° °Reduce your risk of vascular disease: ° °Stop smoking. If you would like help call [facility] at 1-800-QUIT-NOW (1-800-784-8669) or [place] at 336-586-4000. °Manage your cholesterol °Maintain a desired weight °Control your diabetes °Keep your blood pressure down ° °If you have any questions, please call the office at [phone]. ° °

## 2018-07-20 NOTE — Progress Notes (Signed)
Pt received on 4E from PACU RN.  Oriented to room and call bell. PACU RN to follow up on pt belongings which were not transferred with her.

## 2018-07-20 NOTE — Anesthesia Procedure Notes (Addendum)
Arterial Line Insertion Start/End3/23/2020 8:05 AM, 07/20/2018 8:15 AM Performed by: Suzette Battiest, MD, anesthesiologist  Patient location: Pre-op. Preanesthetic checklist: patient identified, IV checked, site marked, risks and benefits discussed, surgical consent, monitors and equipment checked, pre-op evaluation, timeout performed and anesthesia consent Lidocaine 1% used for infiltration Left, femoral was placed Catheter size: 20 Fr Hand hygiene performed  and maximum sterile barriers used   Attempts: 5 or more (one attempt femorally. Multiple attempts at radial and brachial.) Procedure performed using ultrasound guided technique. Ultrasound Notes:anatomy identified, needle tip was noted to be adjacent to the nerve/plexus identified, no ultrasound evidence of intravascular and/or intraneural injection and image(s) printed for medical record Following insertion, dressing applied and Biopatch. Post procedure assessment: normal and unchanged  Patient tolerated the procedure well with no immediate complications.

## 2018-07-20 NOTE — Progress Notes (Signed)
Pt ambulated in hall 470', standby assist, front wheel walker, room air.  She tolerated very well.  Back to bed, call bell within reach.

## 2018-07-20 NOTE — Transfer of Care (Signed)
Immediate Anesthesia Transfer of Care Note  Patient: Kathy Becker  Procedure(s) Performed: ENDARTERECTOMY CAROTID LEFT (Left Neck) PATCH ANGIOPLASTY WITH HEMASHIELD PLATINUM FINESSE CARDIOVASCULAR PATCH (Left Neck)  Patient Location: PACU  Anesthesia Type:General  Level of Consciousness: awake, alert , oriented and patient cooperative  Airway & Oxygen Therapy: Patient Spontanous Breathing and Patient connected to face mask oxygen  Post-op Assessment: Report given to RN, Post -op Vital signs reviewed and stable, Patient moving all extremities X 4 and Patient able to stick tongue midline  Post vital signs: Reviewed and stable  Last Vitals:  Vitals Value Taken Time  BP 151/80 07/20/2018 10:53 AM  Temp    Pulse 80 07/20/2018 10:56 AM  Resp 19 07/20/2018 10:56 AM  SpO2 94 % 07/20/2018 10:56 AM  Vitals shown include unvalidated device data.  Last Pain:  Vitals:   07/20/18 0554  TempSrc: Oral         Complications: No apparent anesthesia complications

## 2018-07-20 NOTE — Anesthesia Postprocedure Evaluation (Signed)
Anesthesia Post Note  Patient: Kathy Becker  Procedure(s) Performed: ENDARTERECTOMY CAROTID LEFT (Left Neck) PATCH ANGIOPLASTY WITH HEMASHIELD PLATINUM FINESSE CARDIOVASCULAR PATCH (Left Neck)     Patient location during evaluation: PACU Anesthesia Type: General Level of consciousness: awake and alert Pain management: pain level controlled Vital Signs Assessment: post-procedure vital signs reviewed and stable Respiratory status: spontaneous breathing, nonlabored ventilation, respiratory function stable and patient connected to nasal cannula oxygen Cardiovascular status: blood pressure returned to baseline and stable Postop Assessment: no apparent nausea or vomiting Anesthetic complications: no    Last Vitals:  Vitals:   07/20/18 1202 07/20/18 1300  BP: (!) 140/102 99/85  Pulse: 67 72  Resp: 14 18  Temp: 36.6 C   SpO2: 97% 100%    Last Pain:  Vitals:   07/20/18 1202  TempSrc: Oral  PainSc:                  [name]

## 2018-07-20 NOTE — Anesthesia Procedure Notes (Signed)
Procedure Name: Intubation Date/Time: 07/20/2018 8:29 AM Performed by: Smith, Alyssia A, CRNA Pre-anesthesia Checklist: Patient identified, Emergency Drugs available, Suction available and Patient being monitored Patient Re-evaluated:Patient Re-evaluated prior to induction Oxygen Delivery Method: Circle system utilized Preoxygenation: Pre-oxygenation with 100% oxygen Induction Type: IV induction and Rapid sequence Laryngoscope Size: Miller and 2 Grade View: Grade I Tube type: Oral Tube size: 7.0 mm Number of attempts: 1 Airway Equipment and Method: Stylet and LTA kit utilized Placement Confirmation: ETT inserted through vocal cords under direct vision,  positive ETCO2 and breath sounds checked- equal and bilateral Secured at: 21 cm Tube secured with: Tape Dental Injury: Teeth and Oropharynx as per pre-operative assessment        

## 2018-07-20 NOTE — Op Note (Signed)
Procedure: Left carotid endarterectomy  Preoperative diagnosis: High-grade asymptomatic left internal carotid artery stenosis  Postoperative diagnosis: Same  Anesthesia General  Asst.: Leontine Locket, PAC  Operative findings: #1 greater than 80% left internal carotid stenosis                                                             #2 Dacron patch           #3 10 Fr shunt  Operative details: After obtaining informed consent, the patient was taken to the operating room. The patient was placed in a supine position on the operating room table. After induction of general anesthesia, the patient's entire neck and chest was prepped and draped in the usual sterile fashion. An oblique incision was made on the left aspect of the patient's neck anterior to the border the left sternocleidomastoid muscle. The incision was carried into the subcutaneous tissues and through the platysma. The sternocleidomastoid muscle was identified and reflected laterally. The omohyoid muscle was identified and this was divided with cautery. The common carotid artery was then found at the base of the incision this was dissected free circumferentially. It was fairly soft on palpation but did have a ridge of posterior plaque.  The vagus nerve was identified and protected. Dissection was then carried up to the level carotid bifurcation.   The hyperglossal nerve was above the primary area of dissection. The ansa cervicalis was divided at its insertion to the hypoglossal in order to provide exposure.  The internal carotid artery was dissected free circumferentially just below the level of the hypoglossal nerve and it was soft in character at this location and above any palpable disease. A vessel loop was placed around this. Next the external carotid and superior thyroid arteries were dissected free circumferentially and vessel loops were placed around these. The patient was given 6000 units of intravenous heparin.  After 2 minutes of  circulation time and raising the mean arterial pressure to 90 mm mercury, the distal internal carotid artery was controlled with small bulldog clamp. The external carotid and superior thyroid arteries were controlled with vessel loops. The common carotid artery was controlled with a peripheral DeBakey clamp. A longitudinal opening was made in the common carotid artery just below the bifurcation. The arteriotomy was extended distally up into the internal carotid with Potts scissors. There was a large calcified plaque with greater than 80% stenosis in the internal carotid.  A 10 Fr shunt was brought onto the field and fashioned to fit the patient's artery.  This was threaded into the distal internal carotid artery and allowed to backbleed thoroughly.  There was good but not pulsatile backbleeding.  This was then threaded into the common carotid and secured with a Rummel tourniquet.   There was no air at this point and flow was restored to the brain.  Attention was then turned to the common carotid artery once again. A suitable endarterectomy plane was obtained and endarterectomy was begun in the common carotid artery and a good proximal endpoint was obtained. An eversion endarterectomy was performed on the external carotid artery and a good endpoint was obtained. The plaque was then elevated in the internal carotid artery and a nice feathered distal endpoint was also obtained.  The plaque was passed off the table.  All loose debris was then removed from the carotid bed and everything was thoroughly irrigated with heparinized saline. A Dacron patch was then brought on to the operative field and this was sewn on as a patch angioplasty using a running 6-0 Prolene suture. Prior to completion of the anastomosis the internal carotid artery was thoroughly backbled. This was then controlled again with a fine bulldog clamp.  The common carotid was thoroughly flushed forward. The external carotid was also thoroughly backbled.   The remainder of the patch was completed and the anastomosis was secured. Flow was then restored first retrograde from the external carotid into the carotid bed then antegrade from the common carotid to the external carotid artery and after approximately 5 cardiac cycles to the internal carotid artery. Doppler was used to evaluate the external/internal and common carotid arteries and these all had good Doppler flow. The patient was also given 50 mg of Protamine.      The platysma muscle was reapproximated using a running 3-0 Vicryl suture. The skin was closed with 4 0 Vicryl subcuticular stitch.  The patient was awakened in the operating room and was moving upper and lower extremities symmetrically and following commands.  The patient was stable on arrival to the PACU.  Ruta Hinds, MD [facility] [place] Office: [phone] Pager: [phone]

## 2018-07-20 NOTE — Interval H&P Note (Signed)
History and Physical Interval Note:  07/20/2018 6:55 AM  Kathy Becker  has presented today for surgery, with the diagnosis of LEFT CAROTID STENOSIS.  The various methods of treatment have been discussed with the patient and family. After consideration of risks, benefits and other options for treatment, the patient has consented to  Procedure(s): ENDARTERECTOMY CAROTID LEFT (Left) as a surgical intervention.  The patient's history has been reviewed, patient examined, no change in status, stable for surgery.  I have reviewed the patient's chart and labs.  Questions were answered to the patient's satisfaction.     Ruta Hinds

## 2018-07-21 ENCOUNTER — Encounter (HOSPITAL_COMMUNITY): Payer: Self-pay | Admitting: Vascular Surgery

## 2018-07-21 LAB — CBC
HCT: 35.9 % — ABNORMAL LOW (ref 36.0–46.0)
Hemoglobin: 11.3 g/dL — ABNORMAL LOW (ref 12.0–15.0)
MCH: 28 pg (ref 26.0–34.0)
MCHC: 31.5 g/dL (ref 30.0–36.0)
MCV: 88.9 fL (ref 80.0–100.0)
Platelets: 145 10*3/uL — ABNORMAL LOW (ref 150–400)
RBC: 4.04 MIL/uL (ref 3.87–5.11)
RDW: 13.2 % (ref 11.5–15.5)
WBC: 8.9 10*3/uL (ref 4.0–10.5)
nRBC: 0 % (ref 0.0–0.2)

## 2018-07-21 LAB — BASIC METABOLIC PANEL
Anion gap: 8 (ref 5–15)
BUN: 25 mg/dL — ABNORMAL HIGH (ref 8–23)
CO2: 22 mmol/L (ref 22–32)
Calcium: 8.6 mg/dL — ABNORMAL LOW (ref 8.9–10.3)
Chloride: 107 mmol/L (ref 98–111)
Creatinine, Ser: 1.35 mg/dL — ABNORMAL HIGH (ref 0.44–1.00)
GFR calc Af Amer: 43 mL/min — ABNORMAL LOW (ref >60)
GFR, EST NON AFRICAN AMERICAN: 37 mL/min — AB (ref >60)
Glucose, Bld: 167 mg/dL — ABNORMAL HIGH (ref 70–99)
Potassium: 4.3 mmol/L (ref 3.5–5.1)
Sodium: 137 mmol/L (ref 135–145)

## 2018-07-21 MED ORDER — OXYCODONE-ACETAMINOPHEN 5-325 MG PO TABS: 1.0000 | ORAL_TABLET | Freq: Four times a day (QID) | ORAL | 0 refills | Status: DC | PRN

## 2018-07-21 NOTE — Progress Notes (Signed)
Order received to discharge patient.  Telemetry monitor removed and CCMD notified.  PIV access removed.  Discharge instructions, follow up, medications and instructions for their use discussed with patient. 

## 2018-07-21 NOTE — Progress Notes (Addendum)
  Progress Note    07/21/2018 7:39 AM 1 Day Post-Op  Subjective:  No neuro events overnight   Vitals:   07/21/18 0000 07/21/18 0557  BP: 103/64 113/67  Pulse: 73 85  Resp: 18 18  Temp: 97.9 F (36.6 C) 98.2 F (36.8 C)  SpO2: 92% 95%   Physical Exam: Lungs:  Non labored Incisions:  L neck incision c/d/i Extremities:  Moving all extremities well Abdomen:  Soft Neurologic: A&O, CN grossly intact  CBC    Component Value Date/Time   WBC 8.9 07/21/2018 0231   RBC 4.04 07/21/2018 0231   HGB 11.3 (L) 07/21/2018 0231   HCT 35.9 (L) 07/21/2018 0231   PLT 145 (L) 07/21/2018 0231   MCV 88.9 07/21/2018 0231   MCH 28.0 07/21/2018 0231   MCHC 31.5 07/21/2018 0231   RDW 13.2 07/21/2018 0231   LYMPHSABS 1.4 06/22/2018 1017   MONOABS 0.5 06/22/2018 1017   EOSABS 0.1 06/22/2018 1017   BASOSABS 0.1 06/22/2018 1017    BMET    Component Value Date/Time   NA 137 07/21/2018 0231   K 4.3 07/21/2018 0231   CL 107 07/21/2018 0231   CO2 22 07/21/2018 0231   GLUCOSE 167 (H) 07/21/2018 0231   BUN 25 (H) 07/21/2018 0231   CREATININE 1.35 (H) 07/21/2018 0231   CALCIUM 8.6 (L) 07/21/2018 0231   GFRNONAA 37 (L) 07/21/2018 0231   GFRAA 43 (L) 07/21/2018 0231    INR    Component Value Date/Time   INR 1.0 07/10/2018 1320     Intake/Output Summary (Last 24 hours) at 07/21/2018 0739 Last data filed at 07/20/2018 2000 Gross per 24 hour  Intake 1560 ml  Output 25 ml  Net 1535 ml     Assessment/Plan:  83 y.o. female is s/p L CEA 1 Day Post-Op   L neck incision unremarkable Neuro exam at baseline Follow up in 2 weeks with Dr. Terri Skains, PA-C [facility] [phone] 07/21/2018 7:39 AM  Agree with above.  No groin hematoma.  No neck hematoma.  Neuro intact  D/c home  Ruta Hinds, MD [facility] [place] Office: [phone] Pager: [phone]

## 2018-07-21 NOTE — Discharge Summary (Signed)
Discharge Summary     Kathy Becker 02-21-1936 83 y.o. female  [id number]  Admission Date: 07/20/2018  Discharge Date: 07/21/18  Physician: Elam Dutch, MD  Admission Diagnosis: LEFT CAROTID STENOSIS  Discharge Day services:    see progress note 07/21/18 Physical Exam: Vitals:   07/21/18 0847 07/21/18 0848  BP: 135/75 135/75  Pulse:    Resp:    Temp:    SpO2:      Hospital Course:  The patient was admitted to the hospital and taken to the operating room on 07/20/2018 and underwent left carotid endarterectomy.  The pt tolerated the procedure well and was transported to the PACU in good condition.   By POD 1, the pt neuro status remained at baseline  The remainder of the hospital course consisted of increasing mobilization and increasing intake of solids without difficulty.  She will follow-up in office in about 2 weeks.  She will be prescribed 1 to 2 days of narcotic pain medication for continued postoperative pain control.  She will be discharged home this morning in stable condition.   Recent Labs    07/21/18 0231  NA 137  K 4.3  CL 107  CO2 22  GLUCOSE 167*  BUN 25*  CALCIUM 8.6*   Recent Labs    07/21/18 0231  WBC 8.9  HGB 11.3*  HCT 35.9*  PLT 145*   No results for input(s): INR in the last 72 hours.     Discharge Diagnosis:  LEFT CAROTID STENOSIS  Secondary Diagnosis: Patient Active Problem List   Diagnosis Date Noted   Carotid artery stenosis, symptomatic, left 07/20/2018   CKD (chronic kidney disease) stage 3, GFR 30-59 ml/min ([place]) 06/22/2018   Left carotid bruit 06/22/2018   Hyperlipidemia 06/17/2007   Essential hypertension 06/17/2007   MYOCARDIAL INFARCTION, HX OF 06/17/2007   Coronary atherosclerosis 06/17/2007   Past Medical History:  Diagnosis Date   CAD (coronary artery disease)    stress test 08-15-08 showing lateral scar but no reversible ischemia    Hyperlipidemia    Hypertension    Myocardial  infarction [facility])    Renal insufficiency     Allergies as of 07/21/2018      Reactions   Sulfonamide Derivatives    Confusion      Medication List    TAKE these medications   amLODipine 5 MG tablet Commonly known as:  NORVASC TAKE 1 TABLET(5 MG) BY MOUTH DAILY What changed:  See the new instructions.   aspirin 81 MG tablet Take 81 mg by mouth at bedtime.   doxycycline 20 MG tablet Commonly known as:  PERIOSTAT Take 20 mg by mouth daily.   GLUCOSAMINE PO Take 2 tablets by mouth daily.   lisinopril 20 MG tablet Commonly known as:  PRINIVIL,ZESTRIL TAKE 1 TABLET BY MOUTH EVERY DAY   oxyCODONE-acetaminophen 5-325 MG tablet Commonly known as:  PERCOCET/ROXICET Take 1 tablet by mouth every 6 (six) hours as needed for moderate pain.   pravastatin 40 MG tablet Commonly known as:  PRAVACHOL TAKE 1 TABLET(40 MG) BY MOUTH DAILY What changed:  See the new instructions.   triamcinolone cream 0.1 % Commonly known as:  KENALOG Apply 1 application topically daily as needed (rash).        Discharge Instructions:   [facility] Discharge Instructions Carotid Endarterectomy (CEA)  Please refer to the following instructions for your post-procedure care. Your surgeon or physician assistant will discuss any changes with you.  Activity  You are encouraged to walk as much as you can. You can slowly return to normal activities but must avoid strenuous activity and heavy lifting until your doctor tell you it's OK. Avoid activities such as vacuuming or swinging a golf club. You can drive after one week if you are comfortable and you are no longer taking prescription pain medications. It is normal to feel tired for serval weeks after your surgery. It is also normal to have difficulty with sleep habits, eating, and bowel movements after surgery. These will go away with time.  Bathing/Showering  You may shower after you come home. Do not soak in a  bathtub, hot tub, or swim until the incision heals completely.  Incision Care  Shower every day. Clean your incision with mild soap and water. Pat the area dry with a clean towel. You do not need a bandage unless otherwise instructed. Do not apply any ointments or creams to your incision. You may have skin glue on your incision. Do not peel it off. It will come off on its own in about one week. Your incision may feel thickened and raised for several weeks after your surgery. This is normal and the skin will soften over time. For Men Only: It's OK to shave around the incision but do not shave the incision itself for 2 weeks. It is common to have numbness under your chin that could last for several months.  Diet  Resume your normal diet. There are no special food restrictions following this procedure. A low fat/low cholesterol diet is recommended for all patients with vascular disease. In order to heal from your surgery, it is CRITICAL to get adequate nutrition. Your body requires vitamins, minerals, and protein. Vegetables are the best source of vitamins and minerals. Vegetables also provide the perfect balance of protein. Processed food has little nutritional value, so try to avoid this.  Medications  Resume taking all of your medications unless your doctor or physician assistant tells you not to.  If your incision is causing pain, you may take over-the- counter pain relievers such as acetaminophen (Tylenol). If you were prescribed a stronger pain medication, please be aware these medications can cause nausea and constipation.  Prevent nausea by taking the medication with a snack or meal. Avoid constipation by drinking plenty of fluids and eating foods with a high amount of fiber, such as fruits, vegetables, and grains. Do not take Tylenol if you are taking prescription pain medications.  Follow Up  Our office will schedule a follow up appointment 2-3 weeks following discharge.  Please call us  immediately for any of the following conditions  Increased pain, redness, drainage (pus) from your incision site. Fever of 101 degrees or higher. If you should develop stroke (slurred speech, difficulty swallowing, weakness on one side of your body, loss of vision) you should call 911 and go to the nearest emergency room.  Reduce your risk of vascular disease:  Stop smoking. If you would like help call [facility] at 1-800-QUIT-NOW 435-333-5366) or [place] at (715)809-6474. Manage your cholesterol Maintain a desired weight Control your diabetes Keep your blood pressure down  If you have any questions, please call the office at [phone].  Disposition: home  Patient's condition: is Good  Follow up: 1. Dr. Oneida Alar in 2 weeks.   Dagoberto Ligas, PA-C [facility] [phone]   --- For [facility] Registry use ---   Modified Rankin score at D/C (0-6): 0  IV medication needed for:  1.  Hypertension: No 2. Hypotension: No  Post-op Complications: No  1. Post-op CVA or TIA: No  If yes: Event classification (right eye, left eye, right cortical, left cortical, verterobasilar, other):   If yes: Timing of event (intra-op, <6 hrs post-op, >=6 hrs post-op, unknown):   2. CN injury: No  If yes: CN  injuried   3. Myocardial infarction: No  If yes: Dx by (EKG or clinical, Troponin):   4.  CHF: No  5.  Dysrhythmia (new): No  6. Wound infection: No  7. Reperfusion symptoms: No  8. Return to OR: No  If yes: return to OR for (bleeding, neurologic, other CEA incision, other):   Discharge medications: Statin use:  Yes ASA use:  Yes   Beta blocker use:  No ACE-Inhibitor use:  Yes  ARB use:  No CCB use: Yes P2Y12 Antagonist use: No, [ ]  Plavix, [ ]  Plasugrel, [ ]  Ticlopinine, [ ]  Ticagrelor, [ ]  Other, [ ]  No for medical reason, [ ]  Non-compliant, [ ]  Not-indicated Anti-coagulant use:  No, [ ]  Warfarin, [ ]  Rivaroxaban, [ ]  Dabigatran,

## 2018-07-22 DIAGNOSIS — I714 Abdominal aortic aneurysm, without rupture: Secondary | ICD-10-CM | POA: Diagnosis not present

## 2018-07-22 DIAGNOSIS — Z48812 Encounter for surgical aftercare following surgery on the circulatory system: Secondary | ICD-10-CM | POA: Diagnosis not present

## 2018-07-22 DIAGNOSIS — I252 Old myocardial infarction: Secondary | ICD-10-CM | POA: Diagnosis not present

## 2018-07-22 DIAGNOSIS — N183 Chronic kidney disease, stage 3 (moderate): Secondary | ICD-10-CM | POA: Diagnosis not present

## 2018-07-22 DIAGNOSIS — I129 Hypertensive chronic kidney disease with stage 1 through stage 4 chronic kidney disease, or unspecified chronic kidney disease: Secondary | ICD-10-CM | POA: Diagnosis not present

## 2018-07-22 DIAGNOSIS — I6522 Occlusion and stenosis of left carotid artery: Secondary | ICD-10-CM | POA: Diagnosis not present

## 2018-07-22 DIAGNOSIS — I2510 Atherosclerotic heart disease of native coronary artery without angina pectoris: Secondary | ICD-10-CM | POA: Diagnosis not present

## 2018-07-22 DIAGNOSIS — I739 Peripheral vascular disease, unspecified: Secondary | ICD-10-CM | POA: Diagnosis not present

## 2018-07-23 DIAGNOSIS — I129 Hypertensive chronic kidney disease with stage 1 through stage 4 chronic kidney disease, or unspecified chronic kidney disease: Secondary | ICD-10-CM | POA: Diagnosis not present

## 2018-07-23 DIAGNOSIS — I252 Old myocardial infarction: Secondary | ICD-10-CM | POA: Diagnosis not present

## 2018-07-23 DIAGNOSIS — I6522 Occlusion and stenosis of left carotid artery: Secondary | ICD-10-CM | POA: Diagnosis not present

## 2018-07-23 DIAGNOSIS — N183 Chronic kidney disease, stage 3 (moderate): Secondary | ICD-10-CM | POA: Diagnosis not present

## 2018-07-23 DIAGNOSIS — Z48812 Encounter for surgical aftercare following surgery on the circulatory system: Secondary | ICD-10-CM | POA: Diagnosis not present

## 2018-07-23 DIAGNOSIS — I2510 Atherosclerotic heart disease of native coronary artery without angina pectoris: Secondary | ICD-10-CM | POA: Diagnosis not present

## 2018-07-23 NOTE — Addendum Note (Signed)
Addendum  created 07/23/18 1030 by Suzette Battiest, MD   Clinical Note Signed, Intraprocedure Blocks edited

## 2018-07-24 DIAGNOSIS — I252 Old myocardial infarction: Secondary | ICD-10-CM | POA: Diagnosis not present

## 2018-07-24 DIAGNOSIS — Z48812 Encounter for surgical aftercare following surgery on the circulatory system: Secondary | ICD-10-CM | POA: Diagnosis not present

## 2018-07-24 DIAGNOSIS — N183 Chronic kidney disease, stage 3 (moderate): Secondary | ICD-10-CM | POA: Diagnosis not present

## 2018-07-24 DIAGNOSIS — I129 Hypertensive chronic kidney disease with stage 1 through stage 4 chronic kidney disease, or unspecified chronic kidney disease: Secondary | ICD-10-CM | POA: Diagnosis not present

## 2018-07-24 DIAGNOSIS — I2510 Atherosclerotic heart disease of native coronary artery without angina pectoris: Secondary | ICD-10-CM | POA: Diagnosis not present

## 2018-07-24 DIAGNOSIS — I6522 Occlusion and stenosis of left carotid artery: Secondary | ICD-10-CM | POA: Diagnosis not present

## 2018-07-27 ENCOUNTER — Telehealth: Payer: Self-pay | Admitting: Vascular Surgery

## 2018-07-27 NOTE — Telephone Encounter (Signed)
sch appt spk to pt mld ltr 08/13/2018 9am p/o MD

## 2018-07-27 NOTE — Telephone Encounter (Signed)
-----   Message from Gabriel Earing, [place] sent at 07/20/2018 10:34 AM EDT ----- S/p left CEA 07/20/2018.  F/u with Dr. Oneida Alar in 2-3 weeks.  Thanks

## 2018-07-29 DIAGNOSIS — I2510 Atherosclerotic heart disease of native coronary artery without angina pectoris: Secondary | ICD-10-CM | POA: Diagnosis not present

## 2018-07-29 DIAGNOSIS — I252 Old myocardial infarction: Secondary | ICD-10-CM | POA: Diagnosis not present

## 2018-07-29 DIAGNOSIS — N183 Chronic kidney disease, stage 3 (moderate): Secondary | ICD-10-CM | POA: Diagnosis not present

## 2018-07-29 DIAGNOSIS — I6522 Occlusion and stenosis of left carotid artery: Secondary | ICD-10-CM | POA: Diagnosis not present

## 2018-07-29 DIAGNOSIS — Z48812 Encounter for surgical aftercare following surgery on the circulatory system: Secondary | ICD-10-CM | POA: Diagnosis not present

## 2018-07-29 DIAGNOSIS — I129 Hypertensive chronic kidney disease with stage 1 through stage 4 chronic kidney disease, or unspecified chronic kidney disease: Secondary | ICD-10-CM | POA: Diagnosis not present

## 2018-07-31 DIAGNOSIS — N183 Chronic kidney disease, stage 3 (moderate): Secondary | ICD-10-CM | POA: Diagnosis not present

## 2018-07-31 DIAGNOSIS — I129 Hypertensive chronic kidney disease with stage 1 through stage 4 chronic kidney disease, or unspecified chronic kidney disease: Secondary | ICD-10-CM | POA: Diagnosis not present

## 2018-07-31 DIAGNOSIS — I252 Old myocardial infarction: Secondary | ICD-10-CM | POA: Diagnosis not present

## 2018-07-31 DIAGNOSIS — I6522 Occlusion and stenosis of left carotid artery: Secondary | ICD-10-CM | POA: Diagnosis not present

## 2018-07-31 DIAGNOSIS — Z48812 Encounter for surgical aftercare following surgery on the circulatory system: Secondary | ICD-10-CM | POA: Diagnosis not present

## 2018-07-31 DIAGNOSIS — I2510 Atherosclerotic heart disease of native coronary artery without angina pectoris: Secondary | ICD-10-CM | POA: Diagnosis not present

## 2018-08-05 DIAGNOSIS — N183 Chronic kidney disease, stage 3 (moderate): Secondary | ICD-10-CM | POA: Diagnosis not present

## 2018-08-05 DIAGNOSIS — Z48812 Encounter for surgical aftercare following surgery on the circulatory system: Secondary | ICD-10-CM | POA: Diagnosis not present

## 2018-08-05 DIAGNOSIS — I2510 Atherosclerotic heart disease of native coronary artery without angina pectoris: Secondary | ICD-10-CM | POA: Diagnosis not present

## 2018-08-05 DIAGNOSIS — I252 Old myocardial infarction: Secondary | ICD-10-CM | POA: Diagnosis not present

## 2018-08-05 DIAGNOSIS — I6522 Occlusion and stenosis of left carotid artery: Secondary | ICD-10-CM | POA: Diagnosis not present

## 2018-08-05 DIAGNOSIS — I129 Hypertensive chronic kidney disease with stage 1 through stage 4 chronic kidney disease, or unspecified chronic kidney disease: Secondary | ICD-10-CM | POA: Diagnosis not present

## 2018-08-06 DIAGNOSIS — I129 Hypertensive chronic kidney disease with stage 1 through stage 4 chronic kidney disease, or unspecified chronic kidney disease: Secondary | ICD-10-CM | POA: Diagnosis not present

## 2018-08-06 DIAGNOSIS — I6522 Occlusion and stenosis of left carotid artery: Secondary | ICD-10-CM | POA: Diagnosis not present

## 2018-08-06 DIAGNOSIS — I2510 Atherosclerotic heart disease of native coronary artery without angina pectoris: Secondary | ICD-10-CM | POA: Diagnosis not present

## 2018-08-06 DIAGNOSIS — I252 Old myocardial infarction: Secondary | ICD-10-CM | POA: Diagnosis not present

## 2018-08-06 DIAGNOSIS — Z48812 Encounter for surgical aftercare following surgery on the circulatory system: Secondary | ICD-10-CM | POA: Diagnosis not present

## 2018-08-06 DIAGNOSIS — N183 Chronic kidney disease, stage 3 (moderate): Secondary | ICD-10-CM | POA: Diagnosis not present

## 2018-08-12 ENCOUNTER — Telehealth (HOSPITAL_COMMUNITY): Payer: Self-pay | Admitting: Rehabilitation

## 2018-08-12 NOTE — Telephone Encounter (Signed)
The above patient or their representative was contacted and gave the following answers to these questions:         Do you have any of the following symptoms? No  Fever                    Cough                   Shortness of breath  Do  you have any of the following other symptoms? No   muscle pain         vomiting,        diarrhea        rash         weakness        red eye        abdominal pain         bruising          bruising or bleeding              joint pain           severe headache    Have you been in contact with someone who was or has been sick in the past 2 weeks? No  Yes                 Unsure                         Unable to assess   Does the person that you were in contact with have any of the following symptoms? n/a  Cough         shortness of breath           muscle pain         vomiting,            diarrhea            rash            weakness           fever            red eye           abdominal pain           bruising  or  bleeding                joint pain                severe headache               Have you  or someone you have been in contact with traveled internationally in th last month? No         If yes, which countries? n/a   Have you  or someone you have been in contact with traveled outside [place] in th last month? No        If yes, which state and city? n/a   COMMENTS OR ACTION PLAN FOR THIS PATIENT:          

## 2018-08-13 ENCOUNTER — Encounter: Payer: Self-pay | Admitting: Vascular Surgery

## 2018-08-13 ENCOUNTER — Other Ambulatory Visit: Payer: Self-pay

## 2018-08-13 ENCOUNTER — Ambulatory Visit (INDEPENDENT_AMBULATORY_CARE_PROVIDER_SITE_OTHER): Payer: Self-pay | Admitting: Vascular Surgery

## 2018-08-13 VITALS — BP 141/86 | HR 76 | Resp 18 | Ht 61.0 in | Wt 138.0 lb

## 2018-08-13 DIAGNOSIS — I6522 Occlusion and stenosis of left carotid artery: Secondary | ICD-10-CM

## 2018-08-13 NOTE — Progress Notes (Signed)
Patient is an 83 year old female who returns for postoperative follow-up today.  She recently underwent left carotid endarterectomy on March 23.  She reports no incisional drainage.  She has no difficulty swallowing.  She does have some mild numbness underneath the left aspect of her chin.  Otherwise she is doing well.  She continues to take her aspirin daily.  She is on a statin.  Physical exam:  Vitals:   08/13/18 0852 08/13/18 0855  BP: (!) 146/90 (!) 141/86  Pulse: 76   Resp: 18   SpO2: 98%   Weight: 138 lb (62.6 kg)   Height: 5\' 1"  (1.549 m)    Neck: Well-healed incision  Neuro: Symmetric upper extremity lower extremity motor strength 5/5 no facial asymmetry  Assessment: Doing well status post left carotid endarterectomy  Plan: Patient needs a follow-up carotid duplex scan in 9 months time and will see our nurse practitioner or PA at that office visit.  She will call sooner if she has any new symptoms that are neurologic in nature.  She will continue to take her aspirin  2.  Small abdominal aortic aneurysm 2.9 cm diameter needs follow-up ultrasound and see our APP in 1 year  Ruta Hinds, MD [facility] [place] Office: [phone] Pager: [phone]

## 2018-09-14 ENCOUNTER — Other Ambulatory Visit: Payer: Self-pay

## 2018-09-14 ENCOUNTER — Emergency Department: Payer: Medicare Other

## 2018-09-14 ENCOUNTER — Emergency Department
Admission: EM | Admit: 2018-09-14 | Discharge: 2018-09-14 | Disposition: A | Discharge status: Home / Self Care | Payer: Medicare Other | Attending: Emergency Medicine | Admitting: Emergency Medicine

## 2018-09-14 DIAGNOSIS — I252 Old myocardial infarction: Secondary | ICD-10-CM | POA: Insufficient documentation

## 2018-09-14 DIAGNOSIS — Y9389 Activity, other specified: Secondary | ICD-10-CM | POA: Insufficient documentation

## 2018-09-14 DIAGNOSIS — N183 Chronic kidney disease, stage 3 (moderate): Secondary | ICD-10-CM | POA: Insufficient documentation

## 2018-09-14 DIAGNOSIS — W010XXA Fall on same level from slipping, tripping and stumbling without subsequent striking against object, initial encounter: Secondary | ICD-10-CM | POA: Diagnosis not present

## 2018-09-14 DIAGNOSIS — Y929 Unspecified place or not applicable: Secondary | ICD-10-CM | POA: Insufficient documentation

## 2018-09-14 DIAGNOSIS — S8255XA Nondisplaced fracture of medial malleolus of left tibia, initial encounter for closed fracture: Secondary | ICD-10-CM | POA: Diagnosis not present

## 2018-09-14 DIAGNOSIS — S82892A Other fracture of left lower leg, initial encounter for closed fracture: Secondary | ICD-10-CM

## 2018-09-14 DIAGNOSIS — S99912A Unspecified injury of left ankle, initial encounter: Secondary | ICD-10-CM | POA: Diagnosis present

## 2018-09-14 DIAGNOSIS — S8252XA Displaced fracture of medial malleolus of left tibia, initial encounter for closed fracture: Secondary | ICD-10-CM | POA: Insufficient documentation

## 2018-09-14 DIAGNOSIS — I129 Hypertensive chronic kidney disease with stage 1 through stage 4 chronic kidney disease, or unspecified chronic kidney disease: Secondary | ICD-10-CM | POA: Insufficient documentation

## 2018-09-14 DIAGNOSIS — S82842A Displaced bimalleolar fracture of left lower leg, initial encounter for closed fracture: Secondary | ICD-10-CM | POA: Diagnosis not present

## 2018-09-14 DIAGNOSIS — Y999 Unspecified external cause status: Secondary | ICD-10-CM | POA: Insufficient documentation

## 2018-09-14 DIAGNOSIS — S8292XA Unspecified fracture of left lower leg, initial encounter for closed fracture: Secondary | ICD-10-CM | POA: Diagnosis not present

## 2018-09-14 DIAGNOSIS — I2510 Atherosclerotic heart disease of native coronary artery without angina pectoris: Secondary | ICD-10-CM | POA: Diagnosis not present

## 2018-09-14 DIAGNOSIS — S9305XA Dislocation of left ankle joint, initial encounter: Secondary | ICD-10-CM | POA: Diagnosis not present

## 2018-09-14 MED ORDER — MORPHINE SULFATE (PF) 2 MG/ML IV SOLN
2.0000 mg | Freq: Once | INTRAVENOUS | Status: AC
  Administered 2018-09-14: 14:00:00 2 mg via INTRAVENOUS

## 2018-09-14 MED ORDER — ONDANSETRON 4 MG PO TBDP
4.0000 mg | ORAL_TABLET | Freq: Once | ORAL | Status: AC
  Administered 2018-09-14: 4 mg via ORAL
  Filled 2018-09-14: qty 1

## 2018-09-14 MED ORDER — MORPHINE SULFATE (PF) 2 MG/ML IV SOLN
INTRAVENOUS | Status: AC
  Filled 2018-09-14: qty 1

## 2018-09-14 MED ORDER — MORPHINE SULFATE (PF) 4 MG/ML IV SOLN
4.0000 mg | Freq: Once | INTRAVENOUS | Status: AC
  Administered 2018-09-14: 4 mg via INTRAMUSCULAR
  Filled 2018-09-14: qty 1

## 2018-09-14 NOTE — Discharge Instructions (Addendum)
Please call the number provided for orthopedics to arrange a follow-up appointment in approximately 1 week for recheck/reevaluation.  Return to the emergency department for any worsening pain, or any other symptom personally concerning to yourself.  Please use your walker and avoid putting weight on your ankle until you have been seen by orthopedics.

## 2018-09-14 NOTE — ED Triage Notes (Signed)
Pt states she slipped taking the trash out and fell in the ditch. Pt has noted deformity of the left ankle. Pt denies any other injury or pain

## 2018-09-14 NOTE — ED Provider Notes (Addendum)
[facility] Emergency Department Provider Note  Time seen: 10:46 AM  I have reviewed the triage vital signs and the nursing notes.   HISTORY  Chief Complaint Ankle Injury   HPI Kathy Becker is a 83 y.o. female with a past medical history of CAD, hypertension, hyperlipidemia, CKD, presents to the emergency department after a fall.  According to the patient she tripped this morning falling onto her left ankle.  Patient states deformity to the left ankle unable to bear weight on left ankle ever since.  Denies hitting her head.  Denies LOC.  Denies any other injuries.  Describes her pain as an 8/10 dull pain in the left ankle.  Worse with any attempted weightbearing or range of motion.   Past Medical History:  Diagnosis Date  . CAD (coronary artery disease)    stress test 08-15-08 showing lateral scar but no reversible ischemia   . Hyperlipidemia   . Hypertension   . Myocardial infarction ([employer])   . Renal insufficiency     Patient Active Problem List   Diagnosis Date Noted  . Carotid artery stenosis, symptomatic, left 07/20/2018  . CKD (chronic kidney disease) stage 3, GFR 30-59 ml/min (HCC) 06/22/2018  . Left carotid bruit 06/22/2018  . Hyperlipidemia 06/17/2007  . Essential hypertension 06/17/2007  . MYOCARDIAL INFARCTION, HX OF 06/17/2007  . Coronary atherosclerosis 06/17/2007    Past Surgical History:  Procedure Laterality Date  . BACK SURGERY  1988   lumbar spine   . CATARACT EXTRACTION, BILATERAL     per Dr. Katy Fitch   . COLONOSCOPY  never   she refuses to ever get one   . DILATION AND CURETTAGE OF UTERUS    . ENDARTERECTOMY Left 07/20/2018   Procedure: ENDARTERECTOMY CAROTID LEFT;  Surgeon: Elam Dutch, MD;  Location: [place];  Service: Vascular;  Laterality: Left;  . PATCH ANGIOPLASTY Left 07/20/2018   Procedure: PATCH ANGIOPLASTY WITH HEMASHIELD PLATINUM FINESSE CARDIOVASCULAR PATCH;  Surgeon: Elam Dutch, MD;  Location: [place];   Service: Vascular;  Laterality: Left;  . TONSILLECTOMY      Prior to Admission medications   Medication Sig Start Date End Date Taking? Authorizing Provider  amLODipine (NORVASC) 5 MG tablet TAKE 1 TABLET(5 MG) BY MOUTH DAILY Patient taking differently: Take 5 mg by mouth daily.  06/16/18   Laurey Morale, MD  aspirin 81 MG tablet Take 81 mg by mouth at bedtime.     [provider]  doxycycline (PERIOSTAT) 20 MG tablet Take 20 mg by mouth daily.    [provider]  Glucosamine HCl (GLUCOSAMINE PO) Take 2 tablets by mouth daily.    [provider]  lisinopril (PRINIVIL,ZESTRIL) 20 MG tablet TAKE 1 TABLET BY MOUTH EVERY DAY Patient taking differently: Take 20 mg by mouth daily.  06/16/18   Laurey Morale, MD  oxyCODONE-acetaminophen (PERCOCET/ROXICET) 5-325 MG tablet Take 1 tablet by mouth every 6 (six) hours as needed for moderate pain. Patient not taking: Reported on 08/13/2018 07/21/18   Dagoberto Ligas, PA-C  pravastatin (PRAVACHOL) 40 MG tablet TAKE 1 TABLET(40 MG) BY MOUTH DAILY Patient taking differently: Take 40 mg by mouth daily.  06/16/18   Laurey Morale, MD  triamcinolone cream (KENALOG) 0.1 % Apply 1 application topically daily as needed (rash).    [provider]    Allergies  Allergen Reactions  . Sulfonamide Derivatives     Confusion    Family History  Problem Relation Age of Onset  .  Coronary artery disease Other     Social History Social History   Tobacco Use  . Smoking status: Never Smoker  . Smokeless tobacco: Never Used  Substance Use Topics  . Alcohol use: No    Alcohol/week: 0.0 standard drinks  . Drug use: No    Review of Systems Constitutional: Negative for fever. Cardiovascular: Negative for chest pain. Respiratory: Negative for shortness of breath. Gastrointestinal: Negative for abdominal pain Musculoskeletal: Left ankle pain and deformity. Skin: Negative for skin complaints  Neurological: Negative for  headache All other ROS negative  ____________________________________________   PHYSICAL EXAM:  VITAL SIGNS: ED Triage Vitals  Enc Vitals Group     BP 09/14/18 1040 (!) 159/85     Pulse Rate 09/14/18 1040 85     Resp 09/14/18 1040 16     Temp 09/14/18 1040 97.8 F (36.6 C)     Temp Source 09/14/18 1040 Oral     SpO2 09/14/18 1040 92 %     Weight 09/14/18 1036 135 lb (61.2 kg)     Height 09/14/18 1036 5\' 4"  (1.626 m)     Head Circumference --      Peak Flow --      Pain Score 09/14/18 1036 7     Pain Loc --      Pain Edu? --      Excl. in [place]? --     Constitutional: Alert and oriented. Well appearing and in no distress. Eyes: Normal exam ENT      Head: Normocephalic and atraumatic      Mouth/Throat: Mucous membranes are moist. Cardiovascular: Normal rate, regular rhythm.  Respiratory: Normal respiratory effort without tachypnea nor retractions. Breath sounds are clear Gastrointestinal: Soft and nontender. No distention. Musculoskeletal: Patient has apparent deformity of the left ankle, neurovascularly intact with 1+ DP pulse, sensation intact to the ankle left foot.  No proximal tibia/fibula tenderness, great range of motion in the hip and knee. Neurologic:  Normal speech and language. No gross focal neurologic deficits  Skin:  Skin is warm, dry and intact.  Psychiatric: Mood and affect are normal.  ____________________________________________   RADIOLOGY  X-ray shows nondisplaced medial malleolus fracture, displaced posterior tibial malleolus fracture with a posterior ankle dislocation. X-ray shows successful reduction of dislocation.  ____________________________________________   INITIAL IMPRESSION / ASSESSMENT AND PLAN / ED COURSE  Pertinent labs & imaging results that were available during my care of the patient were reviewed by me and considered in my medical decision making (see chart for details).   Patient presents to the emergency department for left  ankle pain after a fall this morning.  Differential would include fracture, dislocation, sprain.  We will obtain x-ray imaging, dose pain medication and continue to closely monitor.  X-ray shows fracture and dislocation.  Discussed with Dr. Mack Guise of orthopedics.  I have reduced the patient's dislocation.  We have splinted in a short posterior leg splint with stirrups.  Dr. Mack Guise will see in the office in approximately 1 week.  Patient has ambulated well in the emergency department with a walker.  Patient does not want any pain medication going home states she has plenty of from a prior surgery last month.  Reduction of dislocation Date/Time: 2:37 PM Performed by: Harvest Dark Authorized by: Harvest Dark Consent: Verbal consent obtained. Risks and benefits: risks, benefits and alternatives were discussed Consent given by: patient Required items: required blood products, implants, devices, and special equipment available Time out: Immediately prior to procedure a "time  out" was called to verify the correct patient, procedure, equipment, support staff and site/side marked as required.  Patient sedated: No  Vitals: Vital signs were monitored during sedation. Patient tolerance: Patient tolerated the procedure well with no immediate complications. Joint: Left ankle Reduction technique: Traction    Kathy Becker was evaluated in Emergency Department on 09/14/2018 for the symptoms described in the history of present illness. She was evaluated in the context of the global COVID-19 pandemic, which necessitated consideration that the patient might be at risk for infection with the SARS-CoV-2 virus that causes COVID-19. Institutional protocols and algorithms that pertain to the evaluation of patients at risk for COVID-19 are in a state of rapid change based on information released by regulatory bodies including the CDC and federal and state organizations. These policies and algorithms  were followed during the patient's care in the ED.  ____________________________________________   FINAL CLINICAL IMPRESSION(S) / ED DIAGNOSES  Ankle fracture Ankle dislocation   Harvest Dark, MD 09/14/18 1437    Harvest Dark, MD 09/14/18 1439

## 2018-09-22 DIAGNOSIS — S8265XA Nondisplaced fracture of lateral malleolus of left fibula, initial encounter for closed fracture: Secondary | ICD-10-CM | POA: Diagnosis not present

## 2018-10-05 DIAGNOSIS — S8265XA Nondisplaced fracture of lateral malleolus of left fibula, initial encounter for closed fracture: Secondary | ICD-10-CM | POA: Diagnosis not present

## 2018-10-26 DIAGNOSIS — S8265XA Nondisplaced fracture of lateral malleolus of left fibula, initial encounter for closed fracture: Secondary | ICD-10-CM | POA: Diagnosis not present

## 2018-11-09 DIAGNOSIS — S82855D Nondisplaced trimalleolar fracture of left lower leg, subsequent encounter for closed fracture with routine healing: Secondary | ICD-10-CM | POA: Diagnosis not present

## 2018-11-10 DIAGNOSIS — S82855D Nondisplaced trimalleolar fracture of left lower leg, subsequent encounter for closed fracture with routine healing: Secondary | ICD-10-CM | POA: Diagnosis not present

## 2018-11-12 DIAGNOSIS — S82855D Nondisplaced trimalleolar fracture of left lower leg, subsequent encounter for closed fracture with routine healing: Secondary | ICD-10-CM | POA: Diagnosis not present

## 2018-11-17 DIAGNOSIS — S82855D Nondisplaced trimalleolar fracture of left lower leg, subsequent encounter for closed fracture with routine healing: Secondary | ICD-10-CM | POA: Diagnosis not present

## 2018-11-19 DIAGNOSIS — S82855D Nondisplaced trimalleolar fracture of left lower leg, subsequent encounter for closed fracture with routine healing: Secondary | ICD-10-CM | POA: Diagnosis not present

## 2018-11-24 DIAGNOSIS — S82855D Nondisplaced trimalleolar fracture of left lower leg, subsequent encounter for closed fracture with routine healing: Secondary | ICD-10-CM | POA: Diagnosis not present

## 2018-11-25 ENCOUNTER — Other Ambulatory Visit: Payer: Self-pay

## 2018-11-26 DIAGNOSIS — S82855D Nondisplaced trimalleolar fracture of left lower leg, subsequent encounter for closed fracture with routine healing: Secondary | ICD-10-CM | POA: Diagnosis not present

## 2018-11-30 DIAGNOSIS — S82855D Nondisplaced trimalleolar fracture of left lower leg, subsequent encounter for closed fracture with routine healing: Secondary | ICD-10-CM | POA: Diagnosis not present

## 2018-12-02 DIAGNOSIS — S82855D Nondisplaced trimalleolar fracture of left lower leg, subsequent encounter for closed fracture with routine healing: Secondary | ICD-10-CM | POA: Diagnosis not present

## 2018-12-23 DIAGNOSIS — Z9849 Cataract extraction status, unspecified eye: Secondary | ICD-10-CM | POA: Diagnosis not present

## 2018-12-23 DIAGNOSIS — H52223 Regular astigmatism, bilateral: Secondary | ICD-10-CM | POA: Diagnosis not present

## 2019-01-28 DIAGNOSIS — Z23 Encounter for immunization: Secondary | ICD-10-CM | POA: Diagnosis not present

## 2019-03-01 DIAGNOSIS — D225 Melanocytic nevi of trunk: Secondary | ICD-10-CM | POA: Diagnosis not present

## 2019-03-01 DIAGNOSIS — Z85828 Personal history of other malignant neoplasm of skin: Secondary | ICD-10-CM | POA: Diagnosis not present

## 2019-05-05 ENCOUNTER — Telehealth: Payer: Self-pay | Admitting: *Deleted

## 2019-05-05 NOTE — Telephone Encounter (Signed)
Copied from [place] [id number]. Topic: Medicare AWV >> May 05, 2019  2:22 PM Reyne Dumas L wrote: Reason for CRM:  Pt is ready to schedule AWV.

## 2019-05-05 NOTE — Telephone Encounter (Signed)
Spoke to pt in length. She does not want annual wellness visit with nurse health advisor.  Pt only wants to see PCP for annual office visit.

## 2019-05-19 ENCOUNTER — Other Ambulatory Visit: Payer: Self-pay | Admitting: Family Medicine

## 2019-05-19 ENCOUNTER — Telehealth: Payer: Self-pay | Admitting: Family Medicine

## 2019-05-20 NOTE — Telephone Encounter (Signed)
Ok to fill temporary supply?

## 2019-05-20 NOTE — Telephone Encounter (Signed)
Pt called in and stated that she has an appt in march and would like to know why her meds were refused?  Please advise  Best number -[facility] [place]

## 2019-05-21 NOTE — Telephone Encounter (Signed)
Yes please give her a temporary supply

## 2019-05-24 ENCOUNTER — Other Ambulatory Visit: Payer: Self-pay | Admitting: *Deleted

## 2019-05-24 DIAGNOSIS — E782 Mixed hyperlipidemia: Secondary | ICD-10-CM

## 2019-05-24 DIAGNOSIS — I10 Essential (primary) hypertension: Secondary | ICD-10-CM

## 2019-05-24 MED ORDER — AMLODIPINE BESYLATE 5 MG PO TABS: ORAL_TABLET | ORAL | 1 refills | Status: DC

## 2019-05-24 MED ORDER — LISINOPRIL 20 MG PO TABS: 20.0000 mg | ORAL_TABLET | Freq: Every day | ORAL | 1 refills | Status: DC

## 2019-05-24 MED ORDER — PRAVASTATIN SODIUM 40 MG PO TABS: ORAL_TABLET | ORAL | 1 refills | Status: DC

## 2019-05-24 NOTE — Telephone Encounter (Signed)
Temporary supply sent to pharmacy  

## 2019-06-28 ENCOUNTER — Other Ambulatory Visit: Payer: Self-pay

## 2019-06-29 ENCOUNTER — Encounter: Payer: Self-pay | Admitting: Family Medicine

## 2019-06-29 ENCOUNTER — Ambulatory Visit (INDEPENDENT_AMBULATORY_CARE_PROVIDER_SITE_OTHER): Payer: Medicare Other | Admitting: Family Medicine

## 2019-06-29 VITALS — BP 130/70 | HR 82 | Temp 97.8°F | Ht 64.0 in | Wt 143.8 lb

## 2019-06-29 DIAGNOSIS — N1831 Chronic kidney disease, stage 3a: Secondary | ICD-10-CM | POA: Diagnosis not present

## 2019-06-29 DIAGNOSIS — I10 Essential (primary) hypertension: Secondary | ICD-10-CM | POA: Diagnosis not present

## 2019-06-29 DIAGNOSIS — E782 Mixed hyperlipidemia: Secondary | ICD-10-CM | POA: Diagnosis not present

## 2019-06-29 DIAGNOSIS — I252 Old myocardial infarction: Secondary | ICD-10-CM

## 2019-06-29 LAB — HEPATIC FUNCTION PANEL
ALT: 11 U/L (ref 0–35)
AST: 16 U/L (ref 0–37)
Albumin: 4.1 g/dL (ref 3.5–5.2)
Alkaline Phosphatase: 131 U/L — ABNORMAL HIGH (ref 39–117)
Bilirubin, Direct: 0.1 mg/dL (ref 0.0–0.3)
Total Bilirubin: 0.5 mg/dL (ref 0.2–1.2)
Total Protein: 7 g/dL (ref 6.0–8.3)

## 2019-06-29 LAB — BASIC METABOLIC PANEL
BUN: 16 mg/dL (ref 6–23)
CO2: 26 mEq/L (ref 19–32)
Calcium: 9.4 mg/dL (ref 8.4–10.5)
Chloride: 104 mEq/L (ref 96–112)
Creatinine, Ser: 1.23 mg/dL — ABNORMAL HIGH (ref 0.40–1.20)
GFR: 41.78 mL/min — ABNORMAL LOW (ref >60.00)
Glucose, Bld: 107 mg/dL — ABNORMAL HIGH (ref 70–99)
Potassium: 4.2 mEq/L (ref 3.5–5.1)
Sodium: 139 mEq/L (ref 135–145)

## 2019-06-29 LAB — CBC WITH DIFFERENTIAL/PLATELET
Basophils Absolute: 0.1 10*3/uL (ref 0.0–0.1)
Basophils Relative: 1.1 % (ref 0.0–3.0)
Eosinophils Absolute: 0.1 10*3/uL (ref 0.0–0.7)
Eosinophils Relative: 0.8 % (ref 0.0–5.0)
HCT: 45 % (ref 36.0–46.0)
Hemoglobin: 14.7 g/dL (ref 12.0–15.0)
Lymphocytes Relative: 21.7 % (ref 12.0–46.0)
Lymphs Abs: 1.6 10*3/uL (ref 0.7–4.0)
MCHC: 32.6 g/dL (ref 30.0–36.0)
MCV: 89.8 fl (ref 78.0–100.0)
Monocytes Absolute: 0.4 10*3/uL (ref 0.1–1.0)
Monocytes Relative: 5.1 % (ref 3.0–12.0)
Neutro Abs: 5.4 10*3/uL (ref 1.4–7.7)
Neutrophils Relative %: 71.3 % (ref 43.0–77.0)
Platelets: 150 10*3/uL (ref 150.0–400.0)
RBC: 5.01 Mil/uL (ref 3.87–5.11)
RDW: 14 % (ref 11.5–15.5)
WBC: 7.6 10*3/uL (ref 4.0–10.5)

## 2019-06-29 LAB — LIPID PANEL
Cholesterol: 261 mg/dL — ABNORMAL HIGH (ref 0–200)
HDL: 58 mg/dL (ref >39.00)
LDL Cholesterol: 171 mg/dL — ABNORMAL HIGH (ref 0–99)
NonHDL: 202.96
Total CHOL/HDL Ratio: 4
Triglycerides: 159 mg/dL — ABNORMAL HIGH (ref 0.0–149.0)
VLDL: 31.8 mg/dL (ref 0.0–40.0)

## 2019-06-29 LAB — TSH: TSH: 0.84 u[IU]/mL (ref 0.35–4.50)

## 2019-06-29 MED ORDER — INDOMETHACIN 50 MG PO CAPS: 50.0000 mg | ORAL_CAPSULE | Freq: Three times a day (TID) | ORAL | 5 refills | Status: DC | PRN

## 2019-06-29 MED ORDER — AMLODIPINE BESYLATE 5 MG PO TABS: ORAL_TABLET | ORAL | 11 refills | Status: DC

## 2019-06-29 MED ORDER — PRAVASTATIN SODIUM 40 MG PO TABS: ORAL_TABLET | ORAL | 11 refills | Status: DC

## 2019-06-29 MED ORDER — LISINOPRIL 20 MG PO TABS: 20.0000 mg | ORAL_TABLET | Freq: Every day | ORAL | 11 refills | Status: DC

## 2019-06-29 NOTE — Progress Notes (Signed)
   Subjective:    Patient ID: Kathy Becker, female    DOB: [birth year], 84 y.o.   MRN: [id number]  HPI Here to follow up on issues. She feels great. She had a left carotid endarterectomy in March of last year, and this went very well. Prior to that she was having spells of dizziness, and these have totally stopped. She is active, she still drives her car. BP is stable.    Review of Systems  Constitutional: Negative.   HENT: Negative.   Eyes: Negative.   Respiratory: Negative.   Cardiovascular: Negative.   Gastrointestinal: Negative.   Genitourinary: Negative for decreased urine volume, difficulty urinating, dyspareunia, dysuria, enuresis, flank pain, frequency, hematuria, pelvic pain and urgency.  Musculoskeletal: Negative.   Skin: Negative.   Neurological: Negative.   Psychiatric/Behavioral: Negative.        Objective:   Physical Exam Constitutional:      General: She is not in acute distress.    Appearance: She is well-developed.  HENT:     Head: Normocephalic and atraumatic.     Right Ear: External ear normal.     Left Ear: External ear normal.     Nose: Nose normal.     Mouth/Throat:     Pharynx: No oropharyngeal exudate.  Eyes:     General: No scleral icterus.    Conjunctiva/sclera: Conjunctivae normal.     Pupils: Pupils are equal, round, and reactive to light.  Neck:     Thyroid: No thyromegaly.     Vascular: No JVD.  Cardiovascular:     Rate and Rhythm: Normal rate and regular rhythm.     Heart sounds: Normal heart sounds. No murmur. No friction rub. No gallop.   Pulmonary:     Effort: Pulmonary effort is normal. No respiratory distress.     Breath sounds: Normal breath sounds. No wheezing or rales.  Chest:     Chest wall: No tenderness.  Abdominal:     General: Bowel sounds are normal. There is no distension.     Palpations: Abdomen is soft. There is no mass.     Tenderness: There is no abdominal tenderness. There is no guarding or rebound.    Musculoskeletal:        General: No tenderness. Normal range of motion.     Cervical back: Normal range of motion and neck supple.  Lymphadenopathy:     Cervical: No cervical adenopathy.  Skin:    General: Skin is warm and dry.     Findings: No erythema or rash.  Neurological:     Mental Status: She is alert and oriented to person, place, and time.     Cranial Nerves: No cranial nerve deficit.     Motor: No abnormal muscle tone.     Coordination: Coordination normal.     Deep Tendon Reflexes: Reflexes are normal and symmetric. Reflexes normal.  Psychiatric:        Behavior: Behavior normal.        Thought Content: Thought content normal.        Judgment: Judgment normal.           Assessment & Plan:  Her HTN and CAD are stable. She has recovered well from a CEA, but I reminded her to contact Dr. Oneida Alar about having a one year follow up exam. Get fasting labs today to check lipids, etc. She has received both Covid vaccines.  Alysia Penna, MD

## 2019-07-02 ENCOUNTER — Other Ambulatory Visit: Payer: Self-pay

## 2019-07-14 ENCOUNTER — Telehealth: Payer: Self-pay | Admitting: *Deleted

## 2019-07-14 NOTE — Telephone Encounter (Signed)
[place]  indomethacin (INDOCIN) 50 MG capsule Not covered by patient's plan.  Preferred alternatives:  Ibuprofen tab and Etodolac   Please advise

## 2019-07-14 NOTE — Telephone Encounter (Signed)
Cancel Indocin and call in Etodolac 400 mg BID, #60 with 11 rf

## 2019-07-15 NOTE — Telephone Encounter (Signed)
Spoke with patient and she has already picked up her prescription for Indocin for $13.  She only takes it PRN and does not need a refill at this time.  Patient will try Etodolac at the next refill.  Medication list updated.

## 2019-09-22 ENCOUNTER — Ambulatory Visit (INDEPENDENT_AMBULATORY_CARE_PROVIDER_SITE_OTHER): Payer: Medicare Other | Admitting: Physician Assistant

## 2019-09-22 ENCOUNTER — Other Ambulatory Visit: Payer: Self-pay | Admitting: *Deleted

## 2019-09-22 ENCOUNTER — Other Ambulatory Visit: Payer: Self-pay

## 2019-09-22 ENCOUNTER — Ambulatory Visit (HOSPITAL_COMMUNITY)
Admission: RE | Admit: 2019-09-22 | Discharge: 2019-09-22 | Disposition: A | Discharge status: Home / Self Care | Payer: Medicare Other | Source: Ambulatory Visit | Attending: Vascular Surgery | Admitting: Vascular Surgery

## 2019-09-22 VITALS — BP 145/84 | HR 71 | Temp 98.2°F | Resp 16 | Ht 64.0 in | Wt 140.0 lb

## 2019-09-22 DIAGNOSIS — I6523 Occlusion and stenosis of bilateral carotid arteries: Secondary | ICD-10-CM

## 2019-09-22 DIAGNOSIS — I714 Abdominal aortic aneurysm, without rupture, unspecified: Secondary | ICD-10-CM

## 2019-09-22 NOTE — Progress Notes (Signed)
History of Present Illness:  Patient is a 84 y.o. year old female who presents for evaluation of abdominal aortic aneurysm.  The symptomatic aneurysm is currently 2.9 cm.   She denise abdominal and lumbar pain.  She denise symptoms of claudication or rest pain without history of non healing wounds.  She is s/p left CEA 07/20/2018 by Dr. Oneida Alar.  She denise amaurosis, aphasia and weakness in her extremities.  She has no history of stroke or TIA.    She is on daily aspirin and Statin.    Past Medical History:  Diagnosis Date  . CAD (coronary artery disease)    stress test 08-15-08 showing lateral scar but no reversible ischemia   . Hyperlipidemia   . Hypertension   . Myocardial infarction ([place])   . Renal insufficiency     Past Surgical History:  Procedure Laterality Date  . BACK SURGERY  1988   lumbar spine   . CATARACT EXTRACTION, BILATERAL     per Dr. Katy Fitch   . COLONOSCOPY  never   she refuses to ever get one   . DILATION AND CURETTAGE OF UTERUS    . ENDARTERECTOMY Left 07/20/2018   Procedure: ENDARTERECTOMY CAROTID LEFT;  Surgeon: Elam Dutch, MD;  Location: [place];  Service: Vascular;  Laterality: Left;  . PATCH ANGIOPLASTY Left 07/20/2018   Procedure: PATCH ANGIOPLASTY WITH HEMASHIELD PLATINUM FINESSE CARDIOVASCULAR PATCH;  Surgeon: Elam Dutch, MD;  Location: [place];  Service: Vascular;  Laterality: Left;  . TONSILLECTOMY       Social History Social History   Tobacco Use  . Smoking status: Former Research scientist (life sciences)  . Smokeless tobacco: Never Used  Substance Use Topics  . Alcohol use: No    Alcohol/week: 0.0 standard drinks  . Drug use: No    Family History Family History  Problem Relation Age of Onset  . Coronary artery disease Other     Allergies  Allergies  Allergen Reactions  . Sulfonamide Derivatives     Confusion     Current Outpatient Medications  Medication Sig Dispense Refill  . amLODipine (NORVASC) 5 MG tablet TAKE 1 TABLET(5 MG) BY MOUTH  DAILY 30 tablet 11  . aspirin EC 81 MG tablet Take 81 mg by mouth at bedtime.    Marland Kitchen etodolac (LODINE) 400 MG tablet Take 400 mg by mouth 2 (two) times daily.    . Glucosamine HCl (GLUCOSAMINE PO) Take 2 tablets by mouth daily.    Marland Kitchen lisinopril (ZESTRIL) 20 MG tablet Take 1 tablet (20 mg total) by mouth daily. 30 tablet 11  . pravastatin (PRAVACHOL) 40 MG tablet TAKE 1 TABLET(40 MG) BY MOUTH DAILY 30 tablet 11  . triamcinolone cream (KENALOG) 0.1 % Apply 1 application topically daily as needed (rash).     No current facility-administered medications for this visit.    ROS:   General:  No weight loss, Fever, chills  HEENT: No recent headaches, no nasal bleeding, no visual changes, no sore throat  Neurologic: No dizziness, blackouts, seizures. No recent symptoms of stroke or mini- stroke. No recent episodes of slurred speech, or temporary blindness.  Cardiac: No recent episodes of chest pain/pressure, no shortness of breath at rest.  No shortness of breath with exertion.  Denies history of atrial fibrillation or irregular heartbeat  Vascular: No history of rest pain in feet.  No history of claudication.  No history of non-healing ulcer, No history of DVT   Pulmonary: No home oxygen, no productive cough,  no hemoptysis,  No asthma or wheezing  Musculoskeletal:  [ ]  Arthritis, [ ]  Low back pain,  [x ] Joint pain  Hematologic:No history of hypercoagulable state.  No history of easy bleeding.  No history of anemia  Gastrointestinal: No hematochezia or melena,  No gastroesophageal reflux, no trouble swallowing  Urinary: [ ]  chronic Kidney disease, [ ]  on HD - [ ]  MWF or [ ]  TTHS, [ ]  Burning with urination, [ ]  Frequent urination, [ ]  Difficulty urinating;   Skin: No rashes  Psychological: No history of anxiety,  No history of depression   Physical Examination  Vitals:   09/22/19 0841  BP: (!) 145/84  Pulse: 71  Resp: 16  Temp: 98.2 F (36.8 C)  TempSrc: Oral  Weight: 140 lb  (63.5 kg)  Height: 5\' 4"  (1.626 m)    Body mass index is 24.03 kg/m.  General:  Alert and oriented, no acute distress HEENT: Normal Neck: + right carotid bruit or JVD Pulmonary: Clear to auscultation bilaterally Cardiac: Regular Rate and Rhythm without murmur Gastrointestinal: Soft, non-tender, non-distended, no mass, no scars Skin: No rash Extremity Pulses:  2+ radial, brachial, femoral, dorsalis pedis, posterior tibial pulses bilaterally Musculoskeletal: No deformity or edema  Neurologic: Upper and lower extremity motor 5/5 and symmetric  DATA:  AAA duplex Abdominal Aorta Findings:  +-----------+-------+----------+----------+--------+--------+--------+  Location  AP (cm)Trans (cm)PSV (cm/s)WaveformThrombusComments  +-----------+-------+----------+----------+--------+--------+--------+  Supraceliac2.88  2.80   49    biphasic          +-----------+-------+----------+----------+--------+--------+--------+  Proximal  2.39  2.48   53    biphasic          +-----------+-------+----------+----------+--------+--------+--------+  Mid    1.67  1.80   92    biphasic          +-----------+-------+----------+----------+--------+--------+--------+  Distal   3.07  3.06   70    biphasic          +-----------+-------+----------+----------+--------+--------+--------+  RT CIA Prox1.3  1.0    146    biphasic          +-----------+-------+----------+----------+--------+--------+--------+  LT CIA Prox0.9  1.0    188    biphasic          +-----------+-------+----------+----------+--------+--------+--------+     Summary:  Abdominal Aorta: There is evidence of abnormal dilatation of the distal  Abdominal aorta. The largest aortic measurement is 3.1 cm. The largest  aortic diameter has increased compared to prior exam. Previous diameter    measurement was 2.9 cm obtained on  07/09/2018.    ASSESSMENT:  Stable asymptomatic AAA No change in aneurysmal size since 1 year ago. She remains asymptomatic from stroke and TIA.    PLAN: She will f/u in 2 years for AAA repeat duplex and I have scheduled her for carotid duplex in 2-3 weeks.  We will call her after a review of the study.      Roxy Horseman PA-C [facility] Office: [phone]  MD in clinic [place]

## 2019-09-23 ENCOUNTER — Other Ambulatory Visit: Payer: Self-pay | Admitting: *Deleted

## 2019-09-23 DIAGNOSIS — I6523 Occlusion and stenosis of bilateral carotid arteries: Secondary | ICD-10-CM

## 2019-10-13 ENCOUNTER — Other Ambulatory Visit: Payer: Self-pay

## 2019-10-13 ENCOUNTER — Ambulatory Visit (INDEPENDENT_AMBULATORY_CARE_PROVIDER_SITE_OTHER): Payer: Medicare Other | Admitting: Physician Assistant

## 2019-10-13 ENCOUNTER — Ambulatory Visit (HOSPITAL_COMMUNITY)
Admission: RE | Admit: 2019-10-13 | Discharge: 2019-10-13 | Disposition: A | Discharge status: Home / Self Care | Payer: Medicare Other | Source: Ambulatory Visit | Attending: Vascular Surgery | Admitting: Vascular Surgery

## 2019-10-13 VITALS — BP 154/90 | HR 77 | Temp 97.2°F | Resp 14 | Ht 62.0 in | Wt 139.0 lb

## 2019-10-13 DIAGNOSIS — I714 Abdominal aortic aneurysm, without rupture, unspecified: Secondary | ICD-10-CM

## 2019-10-13 DIAGNOSIS — Z9889 Other specified postprocedural states: Secondary | ICD-10-CM | POA: Diagnosis not present

## 2019-10-13 DIAGNOSIS — I6523 Occlusion and stenosis of bilateral carotid arteries: Secondary | ICD-10-CM | POA: Insufficient documentation

## 2019-10-13 NOTE — Progress Notes (Signed)
Office Note     CC:  follow up Requesting Provider:  Laurey Morale, MD  HPI: Kathy Becker is a 84 y.o. (Jan 20, 1936) female who presents for follow up of carotid stenosis. She is s/p left carotid endarterectomy 07/20/18 by Dr. Donnetta Hutching for high grade asymptomatic stenosis. She denies any changes in vision, trouble speaking, facial drooping, weakness or numbness of upper or lower extremities. She is on daily aspirin and statin  She has a small asymptomatic AAA of 2.9 cm. She remains without symptoms. She denies any abdominal pain or back pain. She does not have any lower extremity symptoms. She denies any claudication, rest pain or non healing wounds  The pt is on a statin for cholesterol management.  The pt is on a daily aspirin.   Other AC: none The pt is  on ACEI and CCB for hypertension.   The pt is not diabetic.  Tobacco hx:  Former smoker  Past Medical History:  Diagnosis Date  . CAD (coronary artery disease)    stress test 08-15-08 showing lateral scar but no reversible ischemia   . Hyperlipidemia   . Hypertension   . Myocardial infarction ([place])   . Renal insufficiency     Past Surgical History:  Procedure Laterality Date  . BACK SURGERY  1988   lumbar spine   . CATARACT EXTRACTION, BILATERAL     per Dr. Katy Fitch   . COLONOSCOPY  never   she refuses to ever get one   . DILATION AND CURETTAGE OF UTERUS    . ENDARTERECTOMY Left 07/20/2018   Procedure: ENDARTERECTOMY CAROTID LEFT;  Surgeon: Elam Dutch, MD;  Location: [place];  Service: Vascular;  Laterality: Left;  . PATCH ANGIOPLASTY Left 07/20/2018   Procedure: PATCH ANGIOPLASTY WITH HEMASHIELD PLATINUM FINESSE CARDIOVASCULAR PATCH;  Surgeon: Elam Dutch, MD;  Location: [place];  Service: Vascular;  Laterality: Left;  . TONSILLECTOMY      Social History   Socioeconomic History  . Marital status: Legally Separated    Spouse name: Not on file  . Number of children: Not on file  . Years of education: Not on file    . Highest education level: Not on file  Occupational History  . Not on file  Tobacco Use  . Smoking status: Former Research scientist (life sciences)  . Smokeless tobacco: Never Used  Vaping Use  . Vaping Use: Never used  Substance and Sexual Activity  . Alcohol use: No    Alcohol/week: 0.0 standard drinks  . Drug use: No  . Sexual activity: Not on file  Other Topics Concern  . Not on file  Social History Narrative  . Not on file   Social Determinants of Health   Financial Resource Strain:   . Difficulty of Paying Living Expenses:   Food Insecurity:   . Worried About Charity fundraiser in the Last Year:   . Arboriculturist in the Last Year:   Transportation Needs:   . Film/video editor (Medical):   Marland Kitchen Lack of Transportation (Non-Medical):   Physical Activity:   . Days of Exercise per Week:   . Minutes of Exercise per Session:   Stress:   . Feeling of Stress :   Social Connections:   . Frequency of Communication with Friends and Family:   . Frequency of Social Gatherings with Friends and Family:   . Attends Religious Services:   . Active Member of Clubs or Organizations:   . Attends Archivist  Meetings:   Marland Kitchen Marital Status:   Intimate Partner Violence:   . Fear of Current or Ex-Partner:   . Emotionally Abused:   Marland Kitchen Physically Abused:   . Sexually Abused:     Family History  Problem Relation Age of Onset  . Coronary artery disease Other     Current Outpatient Medications  Medication Sig Dispense Refill  . amLODipine (NORVASC) 5 MG tablet TAKE 1 TABLET(5 MG) BY MOUTH DAILY 30 tablet 11  . aspirin EC 81 MG tablet Take 81 mg by mouth at bedtime.    Marland Kitchen etodolac (LODINE) 400 MG tablet Take 400 mg by mouth 2 (two) times daily.    . Glucosamine HCl (GLUCOSAMINE PO) Take 2 tablets by mouth daily.    Marland Kitchen lisinopril (ZESTRIL) 20 MG tablet Take 1 tablet (20 mg total) by mouth daily. 30 tablet 11  . pravastatin (PRAVACHOL) 40 MG tablet TAKE 1 TABLET(40 MG) BY MOUTH DAILY 30 tablet 11  .  triamcinolone cream (KENALOG) 0.1 % Apply 1 application topically daily as needed (rash).     No current facility-administered medications for this visit.    Allergies  Allergen Reactions  . Sulfonamide Derivatives     Confusion     REVIEW OF SYSTEMS:  [X]  denotes positive finding, [ ]  denotes negative finding Cardiac  Comments:  Chest pain or chest pressure:    Shortness of breath upon exertion:    Short of breath when lying flat:    Irregular heart rhythm:        Vascular    Pain in calf, thigh, or hip brought on by ambulation:    Pain in feet at night that wakes you up from your sleep:     Blood clot in your veins:    Leg swelling:         Pulmonary    Oxygen at home:    Productive cough:     Wheezing:         Neurologic    Sudden weakness in arms or legs:     Sudden numbness in arms or legs:     Sudden onset of difficulty speaking or slurred speech:    Temporary loss of vision in one eye:     Problems with dizziness:         Gastrointestinal    Blood in stool:     Vomited blood:         Genitourinary    Burning when urinating:     Blood in urine:        Psychiatric    Major depression:         Hematologic    Bleeding problems:    Problems with blood clotting too easily:        Skin    Rashes or ulcers:        Constitutional    Fever or chills:      PHYSICAL EXAMINATION:  Vitals:   10/13/19 1134 10/13/19 1138  BP: 132/77 (!) 154/90  Pulse: 79 77  Resp: 14   Temp: (!) 97.2 F (36.2 C)   TempSrc: Temporal   SpO2: 96%   Weight: 139 lb (63 kg)   Height: 5\' 2"  (1.575 m)     General: very pleasant, well developed and well nourished, in no distress Gait: Normal HENT: WNL, normocephalic Pulmonary: normal non-labored breathing without wheezing Cardiac: regular HR, without  Murmurs without carotid bruit. Left neck incision well healed Abdomen: soft, non distended Skin: without rashes  Vascular Exam/Pulses:  Right Left  Radial 2+ (normal) 2+  (normal)  Ulnar 2+ (normal) 2+ (normal)  Femoral 2+ (normal) 2+ (normal)  Popliteal Not palpable Not palpable  DP 2+ (normal) 2+ (normal)  PT 1+ (weak) 1+ (weak)   Extremities: without ischemic changes, without Gangrene , without cellulitis; without open wounds;  Musculoskeletal: no muscle wasting or atrophy  Neurologic: A&O X 3;  No focal weakness or paresthesias are detected. Upper and lower extremity motor 5/5 and symmetric Psychiatric:  The pt has Normal affect.   Non-Invasive Vascular Imaging:  10/13/19 Right Carotid: Velocities in the right ICA are consistent with a 1-39% stenosis. Left Carotid: Patent endarterectomy site with no evidence of stenosis in the left ICA. Vertebrals: Bilateral vertebral arteries demonstrate antegrade flow. Subclavians: Normal flow hemodynamics were seen in bilateral subclavian arteries.   ASSESSMENT/PLAN:: 84 y.o. female here for follow up for carotid stenosis. She is s/p left carotid endarterectomy 3/23/2o by Dr. Oneida Alar for high grade asymptomatic carotid stenosis. She remains asymptomatic. Her carotid duplex today shows patent left CEA site and 1-39% right ICA stenosis. Antegrade flow of bilateral vertebral's and normal flow in bilateral subclavian arteries. She was recently seen for follow up of her small AAA. She remains asymptomatic in regard to this. She will have follow up in a couple years for repeat duplex  - She will continue her Aspirin and Statin - she will follow up earlier if she has new or concerning symptoms. I also reviewed TIA/ Stroke symptoms with her and advised her should these occur she should seek immediate medical attention or call 911 - She will have AAA duplex in 2 years - She will follow up in 1 year for a Carotid Duplex  Karoline Caldwell, PA-C [facility] [phone]  Clinic MD:   Dr. Scot Dock

## 2019-10-16 IMAGING — CT CT HEAD WITHOUT CONTRAST
1 series · 16 of 30 positions shown, 20 images · non-contrast
Comparison: None.

CLINICAL DATA: Dizziness for the past month. Preoperative study for
carotid artery surgery.

EXAM:
CT HEAD WITHOUT CONTRAST
TECHNIQUE: Contiguous axial images were obtained from the base of the skull
through the vertex without intravenous contrast.

[Series 2: head w/(date) · axial · 0.39mm/px · z∈[-166,-32]mm · 16 of 30 slices shown, 20 images]
[im 2/30  brain]
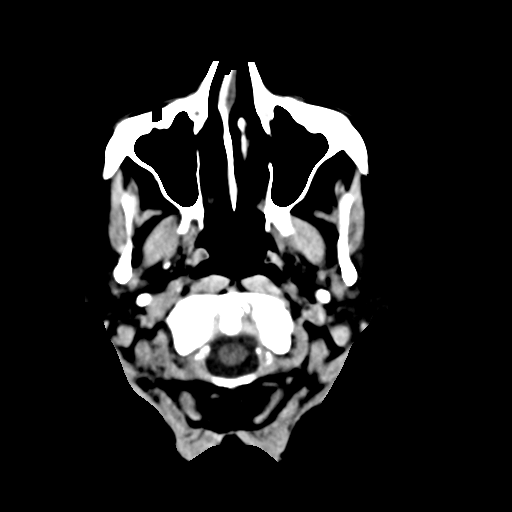
[im 2/30  bone]
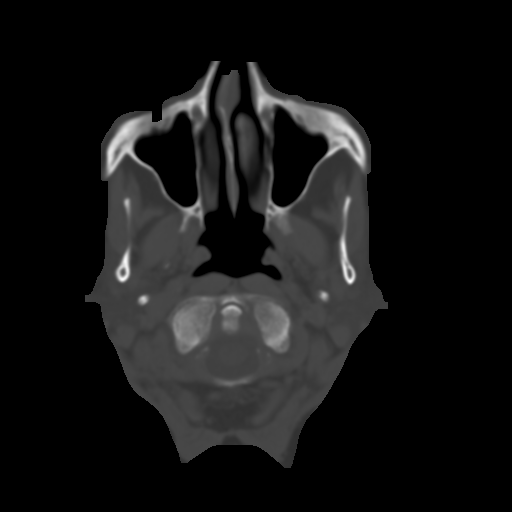
[im 4/30  brain]
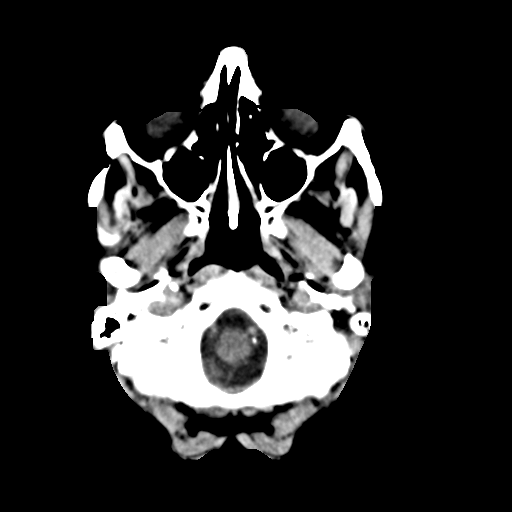
[im 6/30  brain]
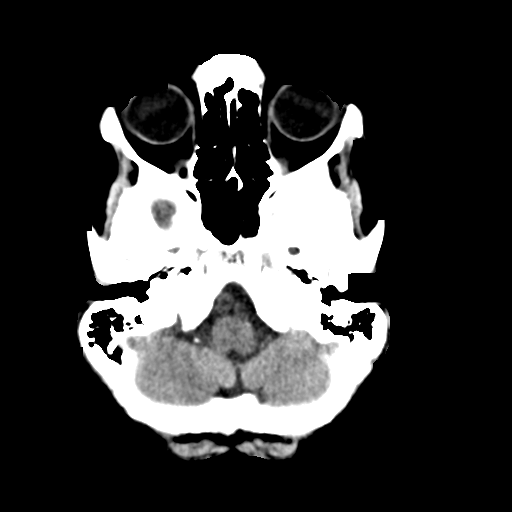
[im 8/30  brain]
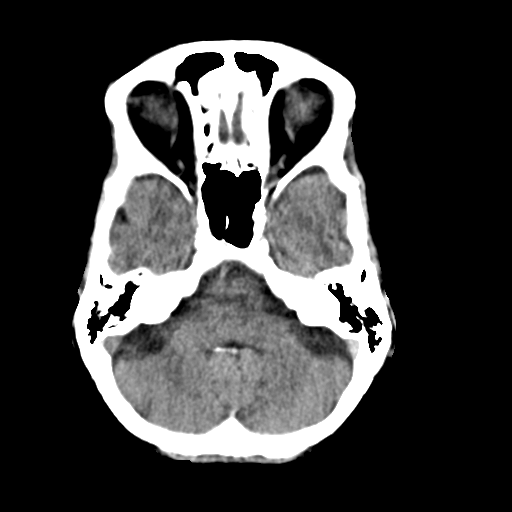
[im 9/30  brain]
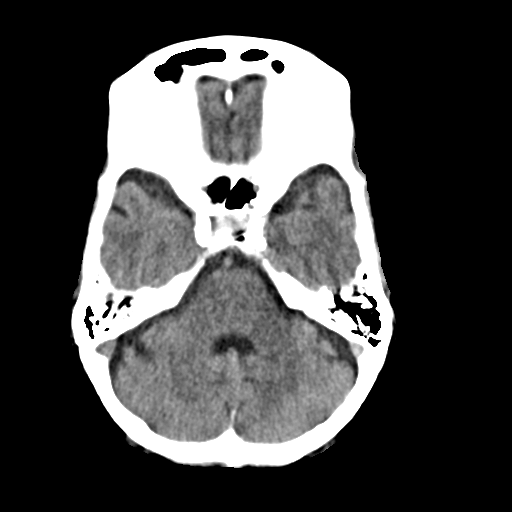
[im 9/30  bone]
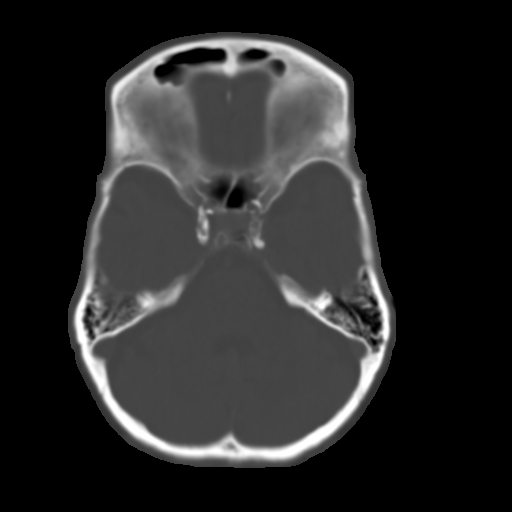
[im 11/30  brain]
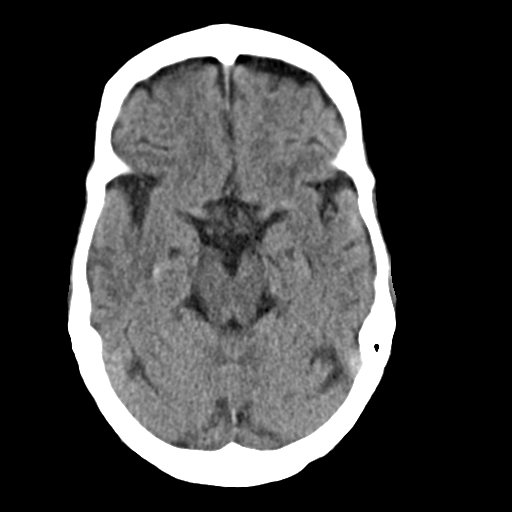
[im 13/30  brain]
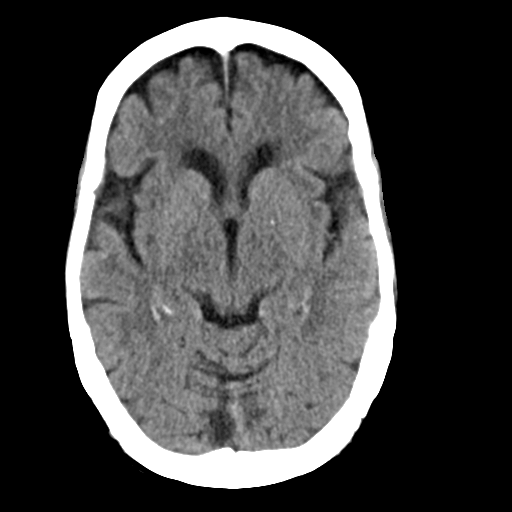
[im 15/30  brain]
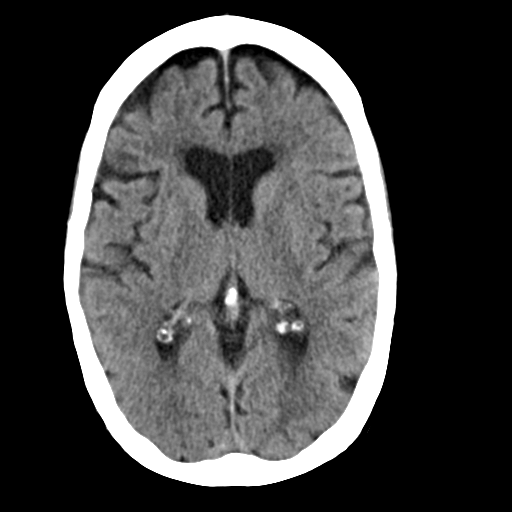
[im 16/30  brain]
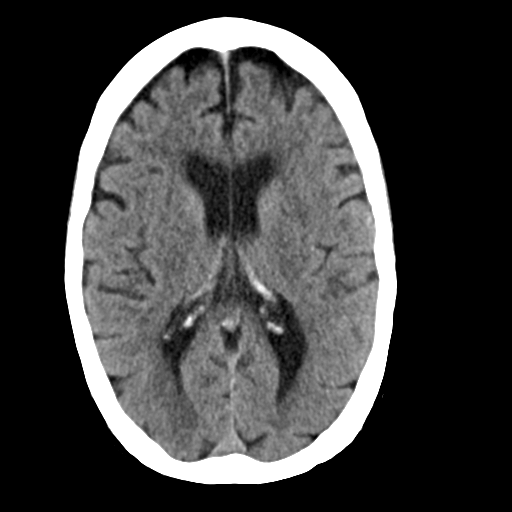
[im 16/30  bone]
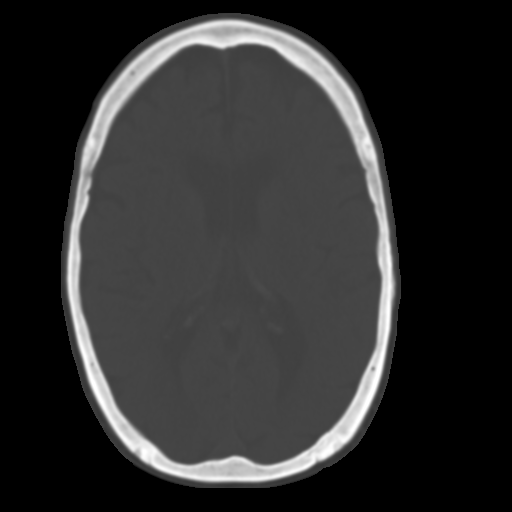
[im 18/30  brain]
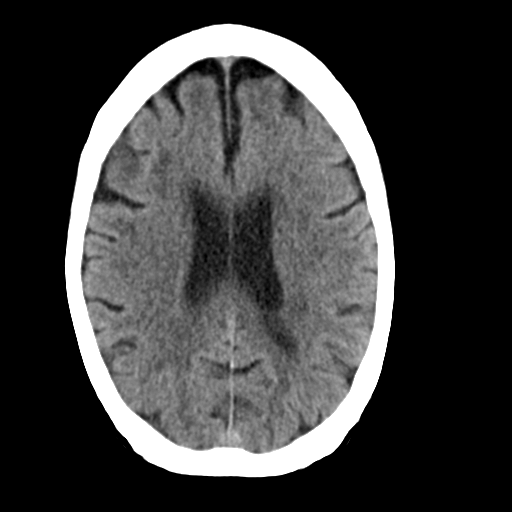
[im 20/30  brain]
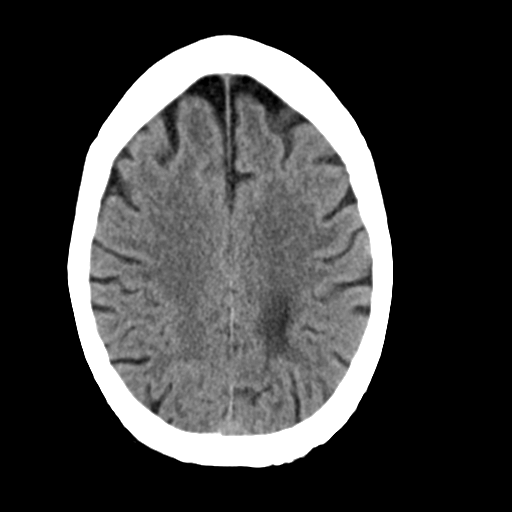
[im 22/30  brain]
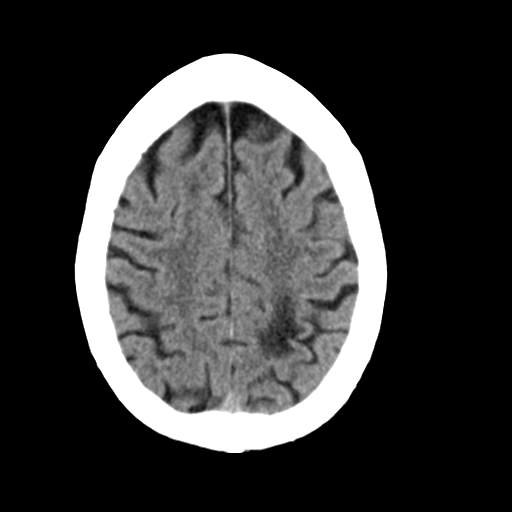
[im 23/30  brain]
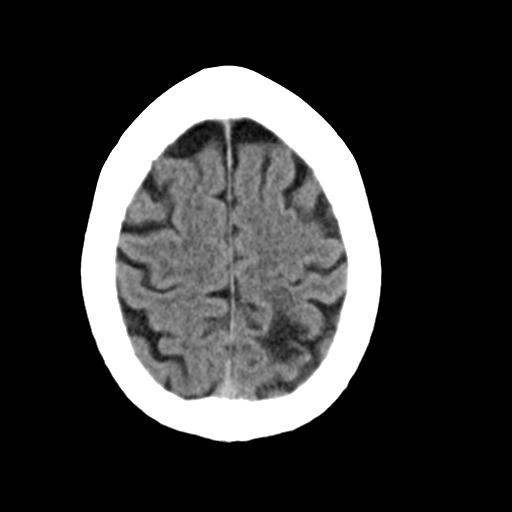
[im 23/30  bone]
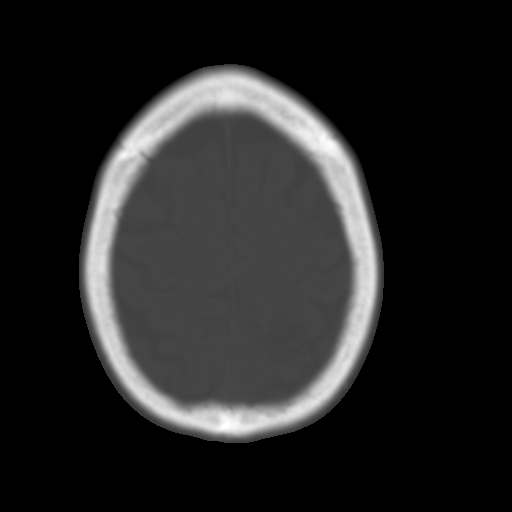
[im 25/30  brain]
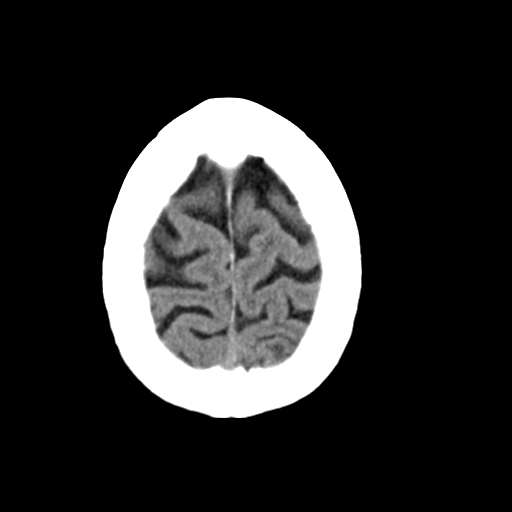
[im 27/30  brain]
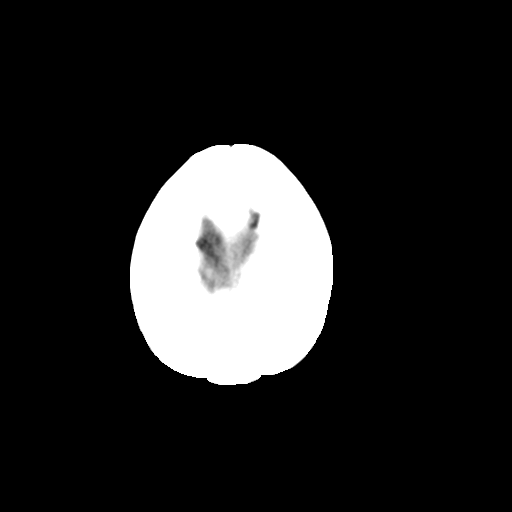
[im 29/30  brain]
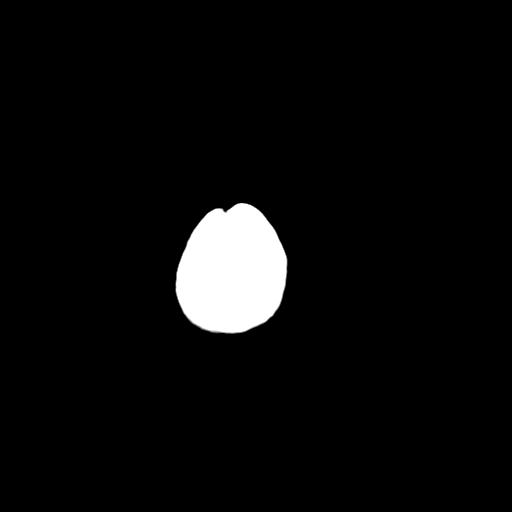

[16 of 30 positions shown; findings below may reference images not displayed]

FINDINGS: Brain: No evidence of acute infarction, hemorrhage, hydrocephalus,
extra-axial collection or mass lesion/mass effect. Old left parietal
infarct. Mild generalized cerebral atrophy. Scattered mild
periventricular and subcortical white matter hypodensities are
nonspecific, but favored to reflect chronic microvascular ischemic
changes.

Vascular: Calcified atherosclerosis at the skullbase. No hyperdense
vessel.

Skull: Normal. Negative for fracture or focal lesion.

Sinuses/Orbits: No acute finding.

Other: None.
IMPRESSION: 1.  No acute intracranial abnormality.
2. Old left parietal infarct.

## 2020-01-13 DIAGNOSIS — Z23 Encounter for immunization: Secondary | ICD-10-CM | POA: Diagnosis not present

## 2020-02-25 DIAGNOSIS — Z23 Encounter for immunization: Secondary | ICD-10-CM | POA: Diagnosis not present

## 2020-03-29 DIAGNOSIS — B353 Tinea pedis: Secondary | ICD-10-CM | POA: Diagnosis not present

## 2020-03-29 DIAGNOSIS — L905 Scar conditions and fibrosis of skin: Secondary | ICD-10-CM | POA: Diagnosis not present

## 2020-03-29 DIAGNOSIS — Z85828 Personal history of other malignant neoplasm of skin: Secondary | ICD-10-CM | POA: Diagnosis not present

## 2020-03-29 DIAGNOSIS — L718 Other rosacea: Secondary | ICD-10-CM | POA: Diagnosis not present

## 2020-03-29 DIAGNOSIS — L570 Actinic keratosis: Secondary | ICD-10-CM | POA: Diagnosis not present

## 2020-03-29 DIAGNOSIS — D225 Melanocytic nevi of trunk: Secondary | ICD-10-CM | POA: Diagnosis not present

## 2020-06-28 ENCOUNTER — Other Ambulatory Visit: Payer: Self-pay

## 2020-06-29 ENCOUNTER — Ambulatory Visit (INDEPENDENT_AMBULATORY_CARE_PROVIDER_SITE_OTHER): Payer: Medicare Other | Admitting: Family Medicine

## 2020-06-29 ENCOUNTER — Encounter: Payer: Self-pay | Admitting: Family Medicine

## 2020-06-29 VITALS — BP 142/86 | HR 77 | Temp 97.8°F | Ht 62.0 in | Wt 153.4 lb

## 2020-06-29 DIAGNOSIS — I714 Abdominal aortic aneurysm, without rupture, unspecified: Secondary | ICD-10-CM

## 2020-06-29 DIAGNOSIS — E782 Mixed hyperlipidemia: Secondary | ICD-10-CM | POA: Diagnosis not present

## 2020-06-29 DIAGNOSIS — N1831 Chronic kidney disease, stage 3a: Secondary | ICD-10-CM | POA: Diagnosis not present

## 2020-06-29 DIAGNOSIS — K219 Gastro-esophageal reflux disease without esophagitis: Secondary | ICD-10-CM

## 2020-06-29 DIAGNOSIS — I10 Essential (primary) hypertension: Secondary | ICD-10-CM

## 2020-06-29 DIAGNOSIS — I6522 Occlusion and stenosis of left carotid artery: Secondary | ICD-10-CM | POA: Diagnosis not present

## 2020-06-29 DIAGNOSIS — R739 Hyperglycemia, unspecified: Secondary | ICD-10-CM | POA: Diagnosis not present

## 2020-06-29 LAB — LIPID PANEL
Cholesterol: 252 mg/dL — ABNORMAL HIGH (ref 0–200)
HDL: 63.4 mg/dL (ref >39.00)
LDL Cholesterol: 162 mg/dL — ABNORMAL HIGH (ref 0–99)
NonHDL: 188.55
Total CHOL/HDL Ratio: 4
Triglycerides: 132 mg/dL (ref 0.0–149.0)
VLDL: 26.4 mg/dL (ref 0.0–40.0)

## 2020-06-29 LAB — BASIC METABOLIC PANEL
BUN: 24 mg/dL — ABNORMAL HIGH (ref 6–23)
CO2: 25 mEq/L (ref 19–32)
Calcium: 9.2 mg/dL (ref 8.4–10.5)
Chloride: 108 mEq/L (ref 96–112)
Creatinine, Ser: 1.37 mg/dL — ABNORMAL HIGH (ref 0.40–1.20)
GFR: 35.79 mL/min — ABNORMAL LOW (ref >60.00)
Glucose, Bld: 100 mg/dL — ABNORMAL HIGH (ref 70–99)
Potassium: 4.4 mEq/L (ref 3.5–5.1)
Sodium: 141 mEq/L (ref 135–145)

## 2020-06-29 LAB — HEPATIC FUNCTION PANEL
ALT: 10 U/L (ref 0–35)
AST: 14 U/L (ref 0–37)
Albumin: 4 g/dL (ref 3.5–5.2)
Alkaline Phosphatase: 100 U/L (ref 39–117)
Bilirubin, Direct: 0.1 mg/dL (ref 0.0–0.3)
Total Bilirubin: 0.5 mg/dL (ref 0.2–1.2)
Total Protein: 6.5 g/dL (ref 6.0–8.3)

## 2020-06-29 LAB — CBC WITH DIFFERENTIAL/PLATELET
Basophils Absolute: 0.1 10*3/uL (ref 0.0–0.1)
Basophils Relative: 1.3 % (ref 0.0–3.0)
Eosinophils Absolute: 0.1 10*3/uL (ref 0.0–0.7)
Eosinophils Relative: 1 % (ref 0.0–5.0)
HCT: 42.5 % (ref 36.0–46.0)
Hemoglobin: 14.2 g/dL (ref 12.0–15.0)
Lymphocytes Relative: 23.8 % (ref 12.0–46.0)
Lymphs Abs: 1.5 10*3/uL (ref 0.7–4.0)
MCHC: 33.4 g/dL (ref 30.0–36.0)
MCV: 88.8 fl (ref 78.0–100.0)
Monocytes Absolute: 0.4 10*3/uL (ref 0.1–1.0)
Monocytes Relative: 5.7 % (ref 3.0–12.0)
Neutro Abs: 4.4 10*3/uL (ref 1.4–7.7)
Neutrophils Relative %: 68.2 % (ref 43.0–77.0)
Platelets: 146 10*3/uL — ABNORMAL LOW (ref 150.0–400.0)
RBC: 4.79 Mil/uL (ref 3.87–5.11)
RDW: 13.7 % (ref 11.5–15.5)
WBC: 6.4 10*3/uL (ref 4.0–10.5)

## 2020-06-29 LAB — HEMOGLOBIN A1C: Hgb A1c MFr Bld: 5.9 % (ref 4.6–6.5)

## 2020-06-29 LAB — TSH: TSH: 1.35 u[IU]/mL (ref 0.35–4.50)

## 2020-06-29 MED ORDER — OMEPRAZOLE 40 MG PO CPDR: 40.0000 mg | DELAYED_RELEASE_CAPSULE | Freq: Every day | ORAL | 11 refills | Status: DC

## 2020-06-29 MED ORDER — PRAVASTATIN SODIUM 40 MG PO TABS: ORAL_TABLET | ORAL | 11 refills | Status: DC

## 2020-06-29 MED ORDER — LISINOPRIL 20 MG PO TABS: 20.0000 mg | ORAL_TABLET | Freq: Every day | ORAL | 11 refills | Status: DC

## 2020-06-29 MED ORDER — AMLODIPINE BESYLATE 5 MG PO TABS: ORAL_TABLET | ORAL | 11 refills | Status: DC

## 2020-06-29 NOTE — Progress Notes (Signed)
Subjective:    Patient ID: Colon Flattery, female    DOB: 1935/11/19, 85 y.o.   MRN: [id number]  HPI Here to follow up on issues. She is doing well in general. She does complain of frequent heartburn which is relieved by taking Pepcid. She saw Vascular Surgery a few months ago, and her carotid disease and AAA are stable. Her BP is stable.    Review of Systems  Constitutional: Negative.   HENT: Negative.   Eyes: Negative.   Respiratory: Negative.   Cardiovascular: Negative.   Gastrointestinal: Negative.   Genitourinary: Negative for decreased urine volume, difficulty urinating, dyspareunia, dysuria, enuresis, flank pain, frequency, hematuria, pelvic pain and urgency.  Musculoskeletal: Negative.   Skin: Negative.   Neurological: Negative.   Psychiatric/Behavioral: Negative.        Objective:   Physical Exam Constitutional:      General: She is not in acute distress.    Appearance: She is well-developed and well-nourished.  HENT:     Head: Normocephalic and atraumatic.     Right Ear: External ear normal.     Left Ear: External ear normal.     Nose: Nose normal.     Mouth/Throat:     Mouth: Oropharynx is clear and moist.     Pharynx: No oropharyngeal exudate.  Eyes:     General: No scleral icterus.    Extraocular Movements: EOM normal.     Conjunctiva/sclera: Conjunctivae normal.     Pupils: Pupils are equal, round, and reactive to light.  Neck:     Thyroid: No thyromegaly.     Vascular: No JVD.  Cardiovascular:     Rate and Rhythm: Normal rate and regular rhythm.     Pulses: Intact distal pulses.     Heart sounds: Normal heart sounds. No murmur heard. No friction rub. No gallop.   Pulmonary:     Effort: Pulmonary effort is normal. No respiratory distress.     Breath sounds: Normal breath sounds. No wheezing or rales.  Chest:     Chest wall: No tenderness.  Abdominal:     General: Bowel sounds are normal. There is no distension.     Palpations: Abdomen is soft.  There is no mass.     Tenderness: There is no abdominal tenderness. There is no guarding or rebound.  Musculoskeletal:        General: No tenderness or edema. Normal range of motion.     Cervical back: Normal range of motion and neck supple.  Lymphadenopathy:     Cervical: No cervical adenopathy.  Skin:    General: Skin is warm and dry.     Findings: No erythema or rash.  Neurological:     Mental Status: She is alert and oriented to person, place, and time.     Cranial Nerves: No cranial nerve deficit.     Motor: No abnormal muscle tone.     Coordination: Coordination normal.     Deep Tendon Reflexes: Reflexes are normal and symmetric. Reflexes normal.  Psychiatric:        Mood and Affect: Mood and affect normal.        Behavior: Behavior normal.        Thought Content: Thought content normal.        Judgment: Judgment normal.           n  She is doing well. Her carotid artery disease and CAD and AAA are stable. HTN is stable. She will get fasting labs  today to check lipids, etc. She will treat the GERD with Omeprazole 40 mg daily.  Alysia Penna, MD

## 2020-06-29 NOTE — Addendum Note (Signed)
Addended by: Tessie Fass D on: 06/29/2020 08:34 AM   Modules accepted: Orders

## 2020-06-30 NOTE — Addendum Note (Signed)
Addended by: Alysia Penna A on: 06/30/2020 07:56 AM   Modules accepted: Orders

## 2020-07-12 DIAGNOSIS — Z20822 Contact with and (suspected) exposure to covid-19: Secondary | ICD-10-CM | POA: Diagnosis not present

## 2020-07-12 DIAGNOSIS — M791 Myalgia, unspecified site: Secondary | ICD-10-CM | POA: Diagnosis not present

## 2020-07-12 DIAGNOSIS — J019 Acute sinusitis, unspecified: Secondary | ICD-10-CM | POA: Diagnosis not present

## 2020-07-26 DIAGNOSIS — K219 Gastro-esophageal reflux disease without esophagitis: Secondary | ICD-10-CM | POA: Diagnosis not present

## 2020-07-26 DIAGNOSIS — R7303 Prediabetes: Secondary | ICD-10-CM | POA: Diagnosis not present

## 2020-07-26 DIAGNOSIS — I129 Hypertensive chronic kidney disease with stage 1 through stage 4 chronic kidney disease, or unspecified chronic kidney disease: Secondary | ICD-10-CM | POA: Diagnosis not present

## 2020-07-26 DIAGNOSIS — N1832 Chronic kidney disease, stage 3b: Secondary | ICD-10-CM | POA: Diagnosis not present

## 2020-07-26 DIAGNOSIS — M109 Gout, unspecified: Secondary | ICD-10-CM | POA: Diagnosis not present

## 2020-07-26 DIAGNOSIS — N1831 Chronic kidney disease, stage 3a: Secondary | ICD-10-CM | POA: Diagnosis not present

## 2020-07-27 ENCOUNTER — Other Ambulatory Visit: Payer: Self-pay | Admitting: Nephrology

## 2020-07-27 DIAGNOSIS — N1832 Chronic kidney disease, stage 3b: Secondary | ICD-10-CM

## 2020-08-10 ENCOUNTER — Other Ambulatory Visit: Payer: Self-pay

## 2020-08-10 ENCOUNTER — Ambulatory Visit
Admission: RE | Admit: 2020-08-10 | Discharge: 2020-08-10 | Disposition: A | Discharge status: Home / Self Care | Payer: Medicare Other | Source: Ambulatory Visit | Attending: Nephrology | Admitting: Nephrology

## 2020-08-10 DIAGNOSIS — N1832 Chronic kidney disease, stage 3b: Secondary | ICD-10-CM

## 2020-08-10 DIAGNOSIS — N281 Cyst of kidney, acquired: Secondary | ICD-10-CM | POA: Diagnosis not present

## 2020-10-24 DIAGNOSIS — Z23 Encounter for immunization: Secondary | ICD-10-CM | POA: Diagnosis not present

## 2020-11-17 DIAGNOSIS — Z1152 Encounter for screening for COVID-19: Secondary | ICD-10-CM | POA: Diagnosis not present

## 2020-11-28 DIAGNOSIS — J209 Acute bronchitis, unspecified: Secondary | ICD-10-CM | POA: Diagnosis not present

## 2020-11-28 DIAGNOSIS — Z20822 Contact with and (suspected) exposure to covid-19: Secondary | ICD-10-CM | POA: Diagnosis not present

## 2020-11-28 DIAGNOSIS — R6883 Chills (without fever): Secondary | ICD-10-CM | POA: Diagnosis not present

## 2020-12-25 DIAGNOSIS — Z1152 Encounter for screening for COVID-19: Secondary | ICD-10-CM | POA: Diagnosis not present

## 2021-01-04 ENCOUNTER — Ambulatory Visit (INDEPENDENT_AMBULATORY_CARE_PROVIDER_SITE_OTHER): Payer: Medicare Other | Admitting: Internal Medicine

## 2021-01-04 ENCOUNTER — Other Ambulatory Visit: Payer: Self-pay

## 2021-01-04 ENCOUNTER — Encounter: Payer: Self-pay | Admitting: Internal Medicine

## 2021-01-04 ENCOUNTER — Telehealth: Payer: Self-pay | Admitting: Family Medicine

## 2021-01-04 VITALS — BP 120/78 | HR 88 | Temp 97.8°F | Wt 150.8 lb

## 2021-01-04 DIAGNOSIS — I6522 Occlusion and stenosis of left carotid artery: Secondary | ICD-10-CM | POA: Diagnosis not present

## 2021-01-04 DIAGNOSIS — N3000 Acute cystitis without hematuria: Secondary | ICD-10-CM | POA: Diagnosis not present

## 2021-01-04 LAB — POCT URINALYSIS DIPSTICK
Bilirubin, UA: NEGATIVE
Glucose, UA: NEGATIVE
Ketones, UA: NEGATIVE
Nitrite, UA: NEGATIVE
Protein, UA: POSITIVE — AB
Spec Grav, UA: 1.025 (ref 1.010–1.025)
Urobilinogen, UA: 0.2 E.U./dL
pH, UA: 6 (ref 5.0–8.0)

## 2021-01-04 MED ORDER — CIPROFLOXACIN HCL 250 MG PO TABS: 250.0000 mg | ORAL_TABLET | Freq: Two times a day (BID) | ORAL | 0 refills | Status: AC

## 2021-01-04 NOTE — Patient Instructions (Signed)
-  Nice seeing you today!!  -Start cipro 250 mg twice daily for 5 days.

## 2021-01-04 NOTE — Addendum Note (Signed)
Addended by: Westley Hummer B on: 01/04/2021 01:51 PM   Modules accepted: Orders

## 2021-01-04 NOTE — Telephone Encounter (Signed)
PT called to advise that the pharmacy has not received the ciprofloxacin (CIPRO) 250 MG tablet. Please resend per the PT.

## 2021-01-04 NOTE — Progress Notes (Signed)
Acute office Visit     This visit occurred during the SARS-CoV-2 public health emergency.  Safety protocols were in place, including screening questions prior to the visit, additional usage of staff PPE, and extensive cleaning of exam room while observing appropriate contact time as indicated for disinfecting solutions.    CC/Reason for Visit: "I have a urine infection"  HPI: Kathy Becker is a 85 y.o. female who is coming in today for the above mentioned reasons.  For the past 2 days she has been having dysuria, increased urinary frequency, foul odor urine, she has had chills but does not believe she has had a fever.  These are similar symptoms as when she has had a UTI in the past.  Past Medical/Surgical History: Past Medical History:  Diagnosis Date   CAD (coronary artery disease)    stress test 08-15-08 showing lateral scar but no reversible ischemia    Hyperlipidemia    Hypertension    Myocardial infarction [facility])    Renal insufficiency     Past Surgical History:  Procedure Laterality Date   BACK SURGERY  1988   lumbar spine    CATARACT EXTRACTION, BILATERAL     per Dr. Katy Fitch    COLONOSCOPY  never   she refuses to ever get one    DILATION AND CURETTAGE OF UTERUS     ENDARTERECTOMY Left 07/20/2018   Procedure: ENDARTERECTOMY CAROTID LEFT;  Surgeon: Elam Dutch, MD;  Location: [place];  Service: Vascular;  Laterality: Left;   PATCH ANGIOPLASTY Left 07/20/2018   Procedure: [place];  Surgeon: Elam Dutch, MD;  Location: [place];  Service: Vascular;  Laterality: Left;   TONSILLECTOMY      Social History:  reports that she has quit smoking. She has never used smokeless tobacco. She reports that she does not drink alcohol and does not use drugs.  Allergies: Allergies  Allergen Reactions   Sulfonamide Derivatives     Confusion    Family History:  Family History  Problem Relation Age of Onset    Coronary artery disease Other      Current Outpatient Medications:    amLODipine (NORVASC) 5 MG tablet, TAKE 1 TABLET(5 MG) BY MOUTH DAILY, Disp: 30 tablet, Rfl: 11   aspirin EC 81 MG tablet, Take 81 mg by mouth at bedtime., Disp: , Rfl:    ciprofloxacin (CIPRO) 250 MG tablet, Take 1 tablet (250 mg total) by mouth 2 (two) times daily for 5 days., Disp: 10 tablet, Rfl: 0   etodolac (LODINE) 400 MG tablet, Take 400 mg by mouth 2 (two) times daily., Disp: , Rfl:    lisinopril (ZESTRIL) 20 MG tablet, Take 1 tablet (20 mg total) by mouth daily., Disp: 30 tablet, Rfl: 11   omeprazole (PRILOSEC) 40 MG capsule, Take 1 capsule (40 mg total) by mouth daily., Disp: 30 capsule, Rfl: 11   pravastatin (PRAVACHOL) 40 MG tablet, TAKE 1 TABLET(40 MG) BY MOUTH DAILY, Disp: 30 tablet, Rfl: 11   triamcinolone cream (KENALOG) 0.1 %, Apply 1 application topically daily as needed (rash)., Disp: , Rfl:   Review of Systems:  Constitutional: Denies fever, chills, diaphoresis, appetite change and fatigue.  HEENT: Denies photophobia, eye pain, redness, hearing loss, ear pain, congestion, sore throat, rhinorrhea, sneezing, mouth sores, trouble swallowing, neck pain, neck stiffness and tinnitus.   Respiratory: Denies SOB, DOE, cough, chest tightness,  and wheezing.   Cardiovascular: Denies chest pain, palpitations and  leg swelling.  Gastrointestinal: Denies nausea, vomiting, abdominal pain, diarrhea, constipation, blood in stool and abdominal distention.  Genitourinary: Denies dysuria, urgency, frequency, difficulty urinating.  Endocrine: Denies: hot or cold intolerance, sweats, changes in hair or nails, polyuria, polydipsia. Musculoskeletal: Denies myalgias, back pain, joint swelling, arthralgias and gait problem.  Skin: Denies pallor, rash and wound.  Neurological: Denies dizziness, seizures, syncope, weakness, light-headedness, numbness and headaches.  Hematological: Denies adenopathy. Easy bruising, personal or  family bleeding history  Psychiatric/Behavioral: Denies suicidal ideation, mood changes, confusion, nervousness, sleep disturbance and agitation    Physical Exam: Vitals:   01/04/21 1309  BP: 120/78  Pulse: 88  Temp: 97.8 F (36.6 C)  TempSrc: Oral  SpO2: 98%  Weight: 150 lb 12.8 oz (68.4 kg)    Body mass index is 27.58 kg/m.   Constitutional: NAD, calm, comfortable Eyes: PERRL, lids and conjunctivae normal ENMT: Mucous membranes are moist.  Respiratory: clear to auscultation bilaterally, no wheezing, no crackles. Normal respiratory effort. No accessory muscle use.  Cardiovascular: Regular rate and rhythm, no murmurs / rubs / gallops. No extremity edema.  Neurologic: Grossly intact and nonfocal Psychiatric: Normal judgment and insight. Alert and oriented x 3. Normal mood.    Impression and Plan:  Acute cystitis without hematuria  - Plan: ciprofloxacin (CIPRO) 250 MG tablet, Urinalysis with Reflex Microscopic, Culture, Urine -She has been unable to produce a urine sample today, she will return a sample when able. -Given symptoms I feel it is appropriate to treat her with an antibiotic, she has a sulfa allergy so we will prescribe Cipro for 5 days. -Urine analysis and culture have been ordered.    Patient Instructions  -Nice seeing you today!!  -Start cipro 250 mg twice daily for 5 days.      [name], MD  [facility]

## 2021-01-04 NOTE — Telephone Encounter (Signed)
PT called again to state the Rx still does not have it. Please call the PT as they state they are feeling just horrible and need this medication.

## 2021-01-05 DIAGNOSIS — N3000 Acute cystitis without hematuria: Secondary | ICD-10-CM | POA: Diagnosis not present

## 2021-01-05 LAB — URINALYSIS, ROUTINE W REFLEX MICROSCOPIC
Bilirubin Urine: NEGATIVE
Hgb urine dipstick: NEGATIVE
Ketones, ur: NEGATIVE
Nitrite: NEGATIVE
RBC / HPF: NONE SEEN (ref 0–?)
Specific Gravity, Urine: 1.01 (ref 1.000–1.030)
Total Protein, Urine: NEGATIVE
Urine Glucose: NEGATIVE
Urobilinogen, UA: 0.2 (ref 0.0–1.0)
pH: 6 (ref 5.0–8.0)

## 2021-01-05 NOTE — Telephone Encounter (Signed)
Spoke with patient, prescription was pickup on 01/04/21.

## 2021-01-06 LAB — URINE CULTURE
MICRO NUMBER:: 12353443
Result:: NO GROWTH
SPECIMEN QUALITY:: ADEQUATE

## 2021-01-18 DIAGNOSIS — Z1152 Encounter for screening for COVID-19: Secondary | ICD-10-CM | POA: Diagnosis not present

## 2021-02-16 DIAGNOSIS — Z1152 Encounter for screening for COVID-19: Secondary | ICD-10-CM | POA: Diagnosis not present

## 2021-02-16 DIAGNOSIS — Z23 Encounter for immunization: Secondary | ICD-10-CM | POA: Diagnosis not present

## 2021-03-03 ENCOUNTER — Other Ambulatory Visit: Payer: Self-pay

## 2021-03-03 DIAGNOSIS — I6523 Occlusion and stenosis of bilateral carotid arteries: Secondary | ICD-10-CM

## 2021-03-21 ENCOUNTER — Ambulatory Visit (INDEPENDENT_AMBULATORY_CARE_PROVIDER_SITE_OTHER): Payer: Medicare Other | Admitting: Physician Assistant

## 2021-03-21 ENCOUNTER — Ambulatory Visit (HOSPITAL_COMMUNITY)
Admission: RE | Admit: 2021-03-21 | Discharge: 2021-03-21 | Disposition: A | Discharge status: Home / Self Care | Payer: Medicare Other | Source: Ambulatory Visit | Attending: Vascular Surgery | Admitting: Vascular Surgery

## 2021-03-21 ENCOUNTER — Other Ambulatory Visit: Payer: Self-pay

## 2021-03-21 VITALS — BP 128/80 | HR 64 | Temp 97.8°F | Resp 20 | Ht 62.0 in | Wt 150.0 lb

## 2021-03-21 DIAGNOSIS — I6523 Occlusion and stenosis of bilateral carotid arteries: Secondary | ICD-10-CM

## 2021-03-21 NOTE — Progress Notes (Signed)
Carotid Artery Follow-Up   VASCULAR SURGERY ASSESSMENT & PLAN:   Kathy Becker is a 85 y.o. female who returns today for routine follow-up.  She underwent left carotid endarterectomy on July 20, 2018 by Dr. Oneida Alar for high-grade asymptomatic stenosis.  Bilateral carotid artery stenosis: The patient has no symptoms referable to carotid artery stenosis.  Duplex examination today is stable as compared to 1 year ago.  We reviewed the signs and symptoms of stroke/TIA and advised the patient to call EMS should these occur.    Left subclavian artery stenosis: no upper extremity claudication or signs of vertebrobasilar insufficiency.  Continue optimal medical management of diabetes, hypertension and follow-up with primary care physician. Encouraged continuation of complete smoking cessation. Continue the following medications: Statin, aspirin Follow-up in 1 year with carotid duplex ultrasound.  SUBJECTIVE:   The patient denies monocular blindness, slurred speech, facial drooping, extremity weakness or numbness. She denies upper extremity claudication, dizziness or near-syncope.  PHYSICAL EXAM:   Vitals:   03/21/21 1426 03/21/21 1437  BP: 134/74 128/80  Pulse: 64   Resp: 20   Temp: 97.8 F (36.6 C)   TempSrc: Temporal   SpO2: 96%   Weight: 150 lb (68 kg)   Height: 5\' 2"  (1.575 m)     General appearance: Well-developed, well-nourished in no apparent distress Neurologic: Alert and oriented x4, tongue is midline, face symmetric, speech fluent, 5 out of 5 bilateral upper extremity grip strength, triceps and biceps strength Cardiovascular: Heart rate and rhythm are regular.  No carotid bruits. Respirations: Nonlabored   NON-INVASIVE VASCULAR STUDIES   03/21/2021 Summary:  Right Carotid: Velocities in the right ICA are consistent with a 1-39% stenosis.   Left Carotid: There is no evidence of stenosis in the left ICA.   Vertebrals:  Bilateral vertebral arteries demonstrate  antegrade flow.  Subclavians: Left subclavian artery was stenotic. Normal flow hemodynamics  Were seen in the right subclavian artery.   *See table(s) above for measurements and observations.   Preliminary     PROBLEM LIST:    The patient's past medical history, past surgical history, family history, social history, allergy list and medication list are reviewed. History of small AAA. Follow-up advised in one year.   CURRENT MEDS:    Current Outpatient Medications:    amLODipine (NORVASC) 5 MG tablet, TAKE 1 TABLET(5 MG) BY MOUTH DAILY, Disp: 30 tablet, Rfl: 11   aspirin EC 81 MG tablet, Take 81 mg by mouth at bedtime., Disp: , Rfl:    doxycycline (PERIOSTAT) 20 MG tablet, Take by mouth., Disp: , Rfl:    lisinopril (ZESTRIL) 20 MG tablet, Take 1 tablet (20 mg total) by mouth daily., Disp: 30 tablet, Rfl: 11   omeprazole (PRILOSEC) 40 MG capsule, Take 1 capsule (40 mg total) by mouth daily., Disp: 30 capsule, Rfl: 11   pravastatin (PRAVACHOL) 40 MG tablet, TAKE 1 TABLET(40 MG) BY MOUTH DAILY, Disp: 30 tablet, Rfl: 11   triamcinolone cream (KENALOG) 0.1 %, Apply 1 application topically daily as needed (rash)., Disp: , Rfl:    REVIEW OF SYSTEMS:   [X]  denotes positive finding, [ ]  denotes negative finding Cardiac  Comments:  Chest pain or chest pressure:    Shortness of breath upon exertion:    Short of breath when lying flat:    Irregular heart rhythm:        Vascular    Pain in calf, thigh, or hip brought on by ambulation:    Pain in feet at night  that wakes you up from your sleep:     Blood clot in your veins:    Leg swelling:         Pulmonary    Oxygen at home:    Productive cough:     Wheezing:         Neurologic    Sudden weakness in arms or legs:     Sudden numbness in arms or legs:     Sudden onset of difficulty speaking or slurred speech:    Temporary loss of vision in one eye:     Problems with dizziness:         Gastrointestinal    Blood in stool:      Vomited blood:         Genitourinary    Burning when urinating:     Blood in urine:        Psychiatric    Major depression:         Hematologic    Bleeding problems:    Problems with blood clotting too easily:        Skin    Rashes or ulcers:        Constitutional    Fever or chills:     Barbie Banner, PA-C  Office: [phone] 03/21/2021 Dr. Claudine Mouton on-call

## 2021-03-29 DIAGNOSIS — D485 Neoplasm of uncertain behavior of skin: Secondary | ICD-10-CM | POA: Diagnosis not present

## 2021-03-29 DIAGNOSIS — L281 Prurigo nodularis: Secondary | ICD-10-CM | POA: Diagnosis not present

## 2021-03-29 DIAGNOSIS — L821 Other seborrheic keratosis: Secondary | ICD-10-CM | POA: Diagnosis not present

## 2021-03-29 DIAGNOSIS — L718 Other rosacea: Secondary | ICD-10-CM | POA: Diagnosis not present

## 2021-03-29 DIAGNOSIS — Z85828 Personal history of other malignant neoplasm of skin: Secondary | ICD-10-CM | POA: Diagnosis not present

## 2021-03-29 DIAGNOSIS — D225 Melanocytic nevi of trunk: Secondary | ICD-10-CM | POA: Diagnosis not present

## 2021-03-29 DIAGNOSIS — Z08 Encounter for follow-up examination after completed treatment for malignant neoplasm: Secondary | ICD-10-CM | POA: Diagnosis not present

## 2021-03-29 DIAGNOSIS — L814 Other melanin hyperpigmentation: Secondary | ICD-10-CM | POA: Diagnosis not present

## 2021-04-15 ENCOUNTER — Other Ambulatory Visit: Payer: Self-pay | Admitting: Family Medicine

## 2021-04-16 DIAGNOSIS — J101 Influenza due to other identified influenza virus with other respiratory manifestations: Secondary | ICD-10-CM | POA: Diagnosis not present

## 2021-04-16 DIAGNOSIS — S22080A Wedge compression fracture of T11-T12 vertebra, initial encounter for closed fracture: Secondary | ICD-10-CM | POA: Diagnosis not present

## 2021-05-25 ENCOUNTER — Telehealth: Payer: Self-pay | Admitting: Family Medicine

## 2021-05-25 NOTE — Telephone Encounter (Signed)
Left message for patient to call back and schedule Medicare Annual Wellness Visit (AWV) either virtually or in office. Left  my Herbie Drape number [phone]   Last AWVI 06/20/17  please schedule at anytime with [facility] Nurse Health Advisor 1 or 2   This should be a 45 minute visit.

## 2021-05-31 ENCOUNTER — Ambulatory Visit (INDEPENDENT_AMBULATORY_CARE_PROVIDER_SITE_OTHER): Payer: Medicare Other

## 2021-05-31 VITALS — Ht 62.0 in | Wt 150.0 lb

## 2021-05-31 DIAGNOSIS — Z0000 Encounter for general adult medical examination without abnormal findings: Secondary | ICD-10-CM | POA: Diagnosis not present

## 2021-05-31 NOTE — Patient Instructions (Addendum)
Kathy Becker , Thank you for taking time to come for your Medicare Wellness Visit. I appreciate your ongoing commitment to your health goals. Please review the following plan we discussed and let me know if I can assist you in the future.   These are the goals we discussed:  Goals      Exercise 150 min/wk Moderate Activity     2 days a week back at gym.         This is a list of the screening recommended for you and due dates:  Health Maintenance  Topic Date Due   COVID-19 Vaccine (4 - Booster for Pfizer series) 06/16/2021*   Zoster (Shingles) Vaccine (1 of 2) 08/28/2021*   DEXA scan (bone density measurement)  05/31/2022*   Tetanus Vaccine  05/31/2022*   Pneumonia Vaccine  Completed   Flu Shot  Completed   HPV Vaccine  Aged Out  *Topic was postponed. The date shown is not the original due date.    Advanced directives: Yes   Conditions/risks identified: None  Next appointment: Follow up in one year for your annual wellness visit    Preventive Care 65 Years and Older, Female Preventive care refers to lifestyle choices and visits with your health care provider that can promote health and wellness. What does preventive care include? A yearly physical exam. This is also called an annual well check. Dental exams once or twice a year. Routine eye exams. Ask your health care provider how often you should have your eyes checked. Personal lifestyle choices, including: Daily care of your teeth and gums. Regular physical activity. Eating a healthy diet. Avoiding tobacco and drug use. Limiting alcohol use. Practicing safe sex. Taking low-dose aspirin every day. Taking vitamin and mineral supplements as recommended by your health care provider. What happens during an annual well check? The services and screenings done by your health care provider during your annual well check will depend on your age, overall health, lifestyle risk factors, and family history of disease. Counseling   Your health care provider may ask you questions about your: Alcohol use. Tobacco use. Drug use. Emotional well-being. Home and relationship well-being. Sexual activity. Eating habits. History of falls. Memory and ability to understand (cognition). Work and work Statistician. Reproductive health. Screening  You may have the following tests or measurements: Height, weight, and BMI. Blood pressure. Lipid and cholesterol levels. These may be checked every 5 years, or more frequently if you are over 22 years old. Skin check. Lung cancer screening. You may have this screening every year starting at age 49 if you have a 30-pack-year history of smoking and currently smoke or have quit within the past 15 years. Fecal occult blood test (FOBT) of the stool. You may have this test every year starting at age 48. Flexible sigmoidoscopy or colonoscopy. You may have a sigmoidoscopy every 5 years or a colonoscopy every 10 years starting at age 29. Hepatitis C blood test. Hepatitis B blood test. Sexually transmitted disease (STD) testing. Diabetes screening. This is done by checking your blood sugar (glucose) after you have not eaten for a while (fasting). You may have this done every 1-3 years. Bone density scan. This is done to screen for osteoporosis. You may have this done starting at age 20. Mammogram. This may be done every 1-2 years. Talk to your health care provider about how often you should have regular mammograms. Talk with your health care provider about your test results, treatment options, and if necessary, the  need for more tests. Vaccines  Your health care provider may recommend certain vaccines, such as: Influenza vaccine. This is recommended every year. Tetanus, diphtheria, and acellular pertussis (Tdap, Td) vaccine. You may need a Td booster every 10 years. Zoster vaccine. You may need this after age 92. Pneumococcal 13-valent conjugate (PCV13) vaccine. One dose is recommended  after age 68. Pneumococcal polysaccharide (PPSV23) vaccine. One dose is recommended after age 27. Talk to your health care provider about which screenings and vaccines you need and how often you need them. This information is not intended to replace advice given to you by your health care provider. Make sure you discuss any questions you have with your health care provider. Document Released: 05/12/2015 Document Revised: 01/03/2016 Document Reviewed: 02/14/2015 Elsevier Interactive Patient Education  2017 [place] Prevention in the Home Falls can cause injuries. They can happen to people of all ages. There are many things you can do to make your home safe and to help prevent falls. What can I do on the outside of my home? Regularly fix the edges of walkways and driveways and fix any cracks. Remove anything that might make you trip as you walk through a door, such as a raised step or threshold. Trim any bushes or trees on the path to your home. Use bright outdoor lighting. Clear any walking paths of anything that might make someone trip, such as rocks or tools. Regularly check to see if handrails are loose or broken. Make sure that both sides of any steps have handrails. Any raised decks and porches should have guardrails on the edges. Have any leaves, snow, or ice cleared regularly. Use sand or salt on walking paths during winter. Clean up any spills in your garage right away. This includes oil or grease spills. What can I do in the bathroom? Use night lights. Install grab bars by the toilet and in the tub and shower. Do not use towel bars as grab bars. Use non-skid mats or decals in the tub or shower. If you need to sit down in the shower, use a plastic, non-slip stool. Keep the floor dry. Clean up any water that spills on the floor as soon as it happens. Remove soap buildup in the tub or shower regularly. Attach bath mats securely with double-sided non-slip rug tape. Do not  have throw rugs and other things on the floor that can make you trip. What can I do in the bedroom? Use night lights. Make sure that you have a light by your bed that is easy to reach. Do not use any sheets or blankets that are too big for your bed. They should not hang down onto the floor. Have a firm chair that has side arms. You can use this for support while you get dressed. Do not have throw rugs and other things on the floor that can make you trip. What can I do in the kitchen? Clean up any spills right away. Avoid walking on wet floors. Keep items that you use a lot in easy-to-reach places. If you need to reach something above you, use a strong step stool that has a grab bar. Keep electrical cords out of the way. Do not use floor polish or wax that makes floors slippery. If you must use wax, use non-skid floor wax. Do not have throw rugs and other things on the floor that can make you trip. What can I do with my stairs? Do not leave any items on the  stairs. Make sure that there are handrails on both sides of the stairs and use them. Fix handrails that are broken or loose. Make sure that handrails are as long as the stairways. Check any carpeting to make sure that it is firmly attached to the stairs. Fix any carpet that is loose or worn. Avoid having throw rugs at the top or bottom of the stairs. If you do have throw rugs, attach them to the floor with carpet tape. Make sure that you have a light switch at the top of the stairs and the bottom of the stairs. If you do not have them, ask someone to add them for you. What else can I do to help prevent falls? Wear shoes that: Do not have high heels. Have rubber bottoms. Are comfortable and fit you well. Are closed at the toe. Do not wear sandals. If you use a stepladder: Make sure that it is fully opened. Do not climb a closed stepladder. Make sure that both sides of the stepladder are locked into place. Ask someone to hold it for  you, if possible. Clearly mark and make sure that you can see: Any grab bars or handrails. First and last steps. Where the edge of each step is. Use tools that help you move around (mobility aids) if they are needed. These include: Canes. Walkers. Scooters. Crutches. Turn on the lights when you go into a dark area. Replace any light bulbs as soon as they burn out. Set up your furniture so you have a clear path. Avoid moving your furniture around. If any of your floors are uneven, fix them. If there are any pets around you, be aware of where they are. Review your medicines with your doctor. Some medicines can make you feel dizzy. This can increase your chance of falling. Ask your doctor what other things that you can do to help prevent falls. This information is not intended to replace advice given to you by your health care provider. Make sure you discuss any questions you have with your health care provider. Document Released: 02/09/2009 Document Revised: 09/21/2015 Document Reviewed: 05/20/2014 Elsevier Interactive Patient Education  2017 [employer].

## 2021-05-31 NOTE — Progress Notes (Signed)
Subjective:   Kathy Becker is a 86 y.o. female who presents for Medicare Annual (Subsequent) preventive examination.  Review of Systems    Virtual Visit via Telephone Note  I connected with  Kathy Becker on 05/31/21 at  1:00 PM EST by telephone and verified that I am speaking with the correct person using two identifiers.  Location: Patient: Home Provider: Office Persons participating in the virtual visit: patient/Nurse Health Advisor   I discussed the limitations, risks, security and privacy concerns of performing an evaluation and management service by telephone and the availability of in person appointments. The patient expressed understanding and agreed to proceed.  Interactive audio and video telecommunications were attempted between this nurse and patient, however failed, due to patient having technical difficulties OR patient did not have access to video capability.  We continued and completed visit with audio only.  Some vital signs may be absent or patient reported.   [name], LPN  Cardiac Risk Factors include: advanced age (>62men, >25 women);hypertension     Objective:    Today's Vitals   05/31/21 1310  Weight: 150 lb (68 kg)  Height: 5\' 2"  (1.575 m)   Body mass index is 27.44 kg/m.  Advanced Directives 05/31/2021 09/14/2018 08/13/2018 07/20/2018 07/10/2018 07/02/2018 06/20/2017  Does Patient Have a Medical Advance Directive? Yes No Yes Yes Yes Yes No  Type of Paramedic of [place];Living will - [place];Living will [place];Living will [place];Living will [place];Living will -  Does patient want to make changes to medical advance directive? No - Patient declined - No - Patient declined No - Patient declined - - -  Copy of [place] in Chart? No - copy requested - - - No - copy requested - -  Would patient like information on creating a  medical advance directive? - No - Patient declined - - - - -    Current Medications (verified) Outpatient Encounter Medications as of 05/31/2021  Medication Sig   amLODipine (NORVASC) 5 MG tablet TAKE 1 TABLET(5 MG) BY MOUTH DAILY   aspirin EC 81 MG tablet Take 81 mg by mouth at bedtime.   doxycycline (PERIOSTAT) 20 MG tablet Take by mouth.   lisinopril (ZESTRIL) 20 MG tablet Take 1 tablet (20 mg total) by mouth daily.   omeprazole (PRILOSEC) 40 MG capsule TAKE 1 CAPSULE(40 MG) BY MOUTH DAILY   pravastatin (PRAVACHOL) 40 MG tablet TAKE 1 TABLET(40 MG) BY MOUTH DAILY   triamcinolone cream (KENALOG) 0.1 % Apply 1 application topically daily as needed (rash).   No facility-administered encounter medications on file as of 05/31/2021.    Allergies (verified) Sulfonamide derivatives   History: Past Medical History:  Diagnosis Date   CAD (coronary artery disease)    stress test 08-15-08 showing lateral scar but no reversible ischemia    Hyperlipidemia    Hypertension    Myocardial infarction [facility])    Renal insufficiency    Past Surgical History:  Procedure Laterality Date   BACK SURGERY  1988   lumbar spine    CATARACT EXTRACTION, BILATERAL     per Dr. Katy Fitch    COLONOSCOPY  never   she refuses to ever get one    DILATION AND CURETTAGE OF UTERUS     ENDARTERECTOMY Left 07/20/2018   Procedure: ENDARTERECTOMY CAROTID LEFT;  Surgeon: Elam Dutch, MD;  Location: [place];  Service: Vascular;  Laterality: Left;  PATCH ANGIOPLASTY Left 07/20/2018   Procedure: PATCH ANGIOPLASTY WITH HEMASHIELD PLATINUM FINESSE CARDIOVASCULAR PATCH;  Surgeon: Elam Dutch, MD;  Location: [facility] OR;  Service: Vascular;  Laterality: Left;   TONSILLECTOMY     Family History  Problem Relation Age of Onset   Coronary artery disease Other    Social History   Socioeconomic History   Marital status: Legally Separated    Spouse name: Not on file   Number of children: Not on file   Years of education: Not  on file   Highest education level: Not on file  Occupational History   Not on file  Tobacco Use   Smoking status: Former   Smokeless tobacco: Never  Vaping Use   Vaping Use: Never used  Substance and Sexual Activity   Alcohol use: No    Alcohol/week: 0.0 standard drinks   Drug use: No   Sexual activity: Not on file  Other Topics Concern   Not on file  Social History Narrative   Not on file   Social Determinants of Health   Financial Resource Strain: Low Risk    Difficulty of Paying Living Expenses: Not very hard  Food Insecurity: No Food Insecurity   Worried About Running Out of Food in the Last Year: Never true   Ran Out of Food in the Last Year: Never true  Transportation Needs: No Transportation Needs   Lack of Transportation (Medical): No   Lack of Transportation (Non-Medical): No  Physical Activity: Insufficiently Active   Days of Exercise per Week: 7 days   Minutes of Exercise per Session: 10 min  Stress: No Stress Concern Present   Feeling of Stress : Not at all  Social Connections: Moderately Integrated   Frequency of Communication with Friends and Family: More than three times a week   Frequency of Social Gatherings with Friends and Family: Three times a week   Attends Religious Services: More than 4 times per year   Active Member of Clubs or Organizations: Yes   Attends Archivist Meetings: 1 to 4 times per year   Marital Status: Divorced     Clinical Intake:  Pre-visit preparation completed: Yes  Pain : No/denies pain     BMI - recorded: 27.43 Nutritional Status: BMI 25 -29 Overweight Nutritional Risks: None Diabetes: No  How often do you need to have someone help you when you read instructions, pamphlets, or other written materials from your doctor or pharmacy?: 1 - Never  Diabetic? No  Interpreter Needed?: No  Activities of Daily Living In your present state of health, do you have any difficulty performing the following  activities: 05/31/2021 05/30/2021  Hearing? N N  Vision? N N  Difficulty concentrating or making decisions? N N  Walking or climbing stairs? N N  Dressing or bathing? N N  Doing errands, shopping? N N  Preparing Food and eating ? N N  Using the Toilet? N N  In the past six months, have you accidently leaked urine? N Y  Comment Wears breifs -  Do you have problems with loss of bowel control? N N  Managing your Medications? N N  Managing your Finances? N N  Housekeeping or managing your Housekeeping? N N  Some recent data might be hidden    Patient Care Team: Laurey Morale, MD as PCP - General  Indicate any recent Medical Services you may have received from other than [facility] providers in the past year (date may be approximate).  Assessment:   This is a routine wellness examination for [facility].  Hearing/Vision screen Hearing Screening - Comments:: No difficulty hearing Vision Screening - Comments:: Wears glasses. Followed by [facility]  Dietary issues and exercise activities discussed: Current Exercise Habits: Home exercise routine, Type of exercise: walking, Time (Minutes): 20, Frequency (Times/Week): 7, Weekly Exercise (Minutes/Week): 140, Intensity: Moderate, Exercise limited by: None identified   Goals Addressed             This Visit's Progress    Exercise 150 min/wk Moderate Activity       2 days a week back at gym.        Depression Screen PHQ 2/9 Scores 05/31/2021 06/22/2018 06/20/2017 06/20/2017 06/20/2015 06/16/2014  PHQ - 2 Score 0 1 0 0 0 0    Fall Risk Fall Risk  05/31/2021 05/30/2021 11/25/2018 06/22/2018 06/20/2017  Falls in the past year? 0 1 (No Data) 1 No  Comment - - Emmi Telephone Survey: data to providers prior to load - -  Number falls in past yr: 0 1 (No Data) - -  Comment - - Emmi Telephone Survey Actual Response =  - -  Injury with Fall? 0 0 - 0 -  Risk for fall due to : No Fall Risks - - - -  Follow up Falls evaluation completed - - - -    FALL  RISK PREVENTION PERTAINING TO THE HOME:  Any stairs in or around the home? Yes  If so, are there any without handrails? No  Home free of loose throw rugs in walkways, pet beds, electrical cords, etc? Yes  Adequate lighting in your home to reduce risk of falls? Yes   ASSISTIVE DEVICES UTILIZED TO PREVENT FALLS:  Life alert? No  Use of a cane, walker or w/c? No  Grab bars in the bathroom? Yes  Shower chair or bench in shower? No  Elevated toilet seat or a handicapped toilet? No   TIMED UP AND GO:  Was the test performed? No . Audio Visit  Cognitive Function: MMSE - Mini Mental State Exam 06/20/2017  Not completed: (No Data)     6CIT Screen 05/31/2021  What Year? 0 points  What month? 0 points  What time? 0 points  Count back from 20 0 points  Months in reverse 0 points  Repeat phrase 0 points  Total Score 0    Immunizations Immunization History  Administered Date(s) Administered   Influenza Split 02/10/2013   Influenza, High Dose Seasonal PF 12/20/2016, 01/21/2018   Influenza-Unspecified 01/27/2014, 02/16/2021   PFIZER Comirnaty(Gray Top)Covid-19 Tri-Sucrose Vaccine 06/03/2019, 06/24/2019, 02/25/2020   Pneumococcal Conjugate-13 06/20/2015   Pneumococcal Polysaccharide-23 06/20/2017   Zoster, Live 03/26/2011    TDAP status: Due, Education has been provided regarding the importance of this vaccine. Advised may receive this vaccine at local pharmacy or Health Dept. Aware to provide a copy of the vaccination record if obtained from local pharmacy or Health Dept. Verbalized acceptance and understanding.  Flu Vaccine status: Up to date  Pneumococcal vaccine status: Up to date  Covid-19 vaccine status: Information provided on how to obtain vaccines.   Qualifies for Shingles Vaccine? Yes   Zostavax completed No   Shingrix Completed?: No.    Education has been provided regarding the importance of this vaccine. Patient has been advised to call insurance company to determine  out of pocket expense if they have not yet received this vaccine. Advised may also receive vaccine at local pharmacy or Health Dept. Verbalized  acceptance and understanding.  Screening Tests Health Maintenance  Topic Date Due   COVID-19 Vaccine (4 - Booster for Pfizer series) 06/16/2021 (Originally 04/21/2020)   Zoster Vaccines- Shingrix (1 of 2) 08/28/2021 (Originally 04/30/1987)   DEXA SCAN  05/31/2022 (Originally 04/29/2002)   TETANUS/TDAP  05/31/2022 (Originally 04/29/1956)   Pneumonia Vaccine 13+ Years old  Completed   INFLUENZA VACCINE  Completed   HPV VACCINES  Aged Out    Health Maintenance  There are no preventive care reminders to display for this patient.   Dexa Scan: Patient Deferred  Additional Screening:   Vision Screening: Recommended annual ophthalmology exams for early detection of glaucoma and other disorders of the eye. Is the patient up to date with their annual eye exam?  Yes  Who is the provider or what is the name of the office in which the patient attends annual eye exams? [facility] If pt is not established with a provider, would they like to be referred to a provider to establish care? No .   Dental Screening: Recommended annual dental exams for proper oral hygiene  Community Resource Referral / Chronic Care Management: CRR required this visit?  No   CCM required this visit?  No      Plan:     I have personally reviewed and noted the following in the patients chart:   Medical and social history Use of alcohol, tobacco or illicit drugs  Current medications and supplements including opioid prescriptions. Patient currently not taking opioids Functional ability and status Nutritional status Physical activity Advanced directives List of other physicians Hospitalizations, surgeries, and ER visits in previous 12 months Vitals Screenings to include cognitive, depression, and falls Referrals and appointments  In addition, I have reviewed and  discussed with patient certain preventive protocols, quality metrics, and best practice recommendations. A written personalized care plan for preventive services as well as general preventive health recommendations were provided to patient.     [name], LPN   12/01/1658   Nurse Notes: None

## 2021-06-20 ENCOUNTER — Other Ambulatory Visit: Payer: Self-pay | Admitting: Family Medicine

## 2021-06-20 DIAGNOSIS — I10 Essential (primary) hypertension: Secondary | ICD-10-CM

## 2021-07-02 ENCOUNTER — Encounter: Payer: Self-pay | Admitting: Family Medicine

## 2021-07-02 ENCOUNTER — Ambulatory Visit (INDEPENDENT_AMBULATORY_CARE_PROVIDER_SITE_OTHER): Payer: Medicare Other | Admitting: Family Medicine

## 2021-07-02 VITALS — BP 128/70 | HR 68 | Temp 97.9°F | Ht 62.0 in | Wt 148.3 lb

## 2021-07-02 DIAGNOSIS — K219 Gastro-esophageal reflux disease without esophagitis: Secondary | ICD-10-CM | POA: Diagnosis not present

## 2021-07-02 DIAGNOSIS — I10 Essential (primary) hypertension: Secondary | ICD-10-CM

## 2021-07-02 DIAGNOSIS — N1831 Chronic kidney disease, stage 3a: Secondary | ICD-10-CM | POA: Diagnosis not present

## 2021-07-02 DIAGNOSIS — E782 Mixed hyperlipidemia: Secondary | ICD-10-CM | POA: Diagnosis not present

## 2021-07-02 DIAGNOSIS — I2510 Atherosclerotic heart disease of native coronary artery without angina pectoris: Secondary | ICD-10-CM

## 2021-07-02 DIAGNOSIS — R739 Hyperglycemia, unspecified: Secondary | ICD-10-CM | POA: Diagnosis not present

## 2021-07-02 LAB — CBC WITH DIFFERENTIAL/PLATELET
Basophils Absolute: 0 10*3/uL (ref 0.0–0.1)
Basophils Relative: 0.7 % (ref 0.0–3.0)
Eosinophils Absolute: 0.1 10*3/uL (ref 0.0–0.7)
Eosinophils Relative: 1 % (ref 0.0–5.0)
HCT: 42 % (ref 36.0–46.0)
Hemoglobin: 13.8 g/dL (ref 12.0–15.0)
Lymphocytes Relative: 23.1 % (ref 12.0–46.0)
Lymphs Abs: 1.5 10*3/uL (ref 0.7–4.0)
MCHC: 32.7 g/dL (ref 30.0–36.0)
MCV: 88.4 fl (ref 78.0–100.0)
Monocytes Absolute: 0.4 10*3/uL (ref 0.1–1.0)
Monocytes Relative: 5.3 % (ref 3.0–12.0)
Neutro Abs: 4.6 10*3/uL (ref 1.4–7.7)
Neutrophils Relative %: 69.9 % (ref 43.0–77.0)
Platelets: 143 10*3/uL — ABNORMAL LOW (ref 150.0–400.0)
RBC: 4.76 Mil/uL (ref 3.87–5.11)
RDW: 15.3 % (ref 11.5–15.5)
WBC: 6.6 10*3/uL (ref 4.0–10.5)

## 2021-07-02 LAB — HEPATIC FUNCTION PANEL
ALT: 8 U/L (ref 0–35)
AST: 16 U/L (ref 0–37)
Albumin: 4.2 g/dL (ref 3.5–5.2)
Alkaline Phosphatase: 104 U/L (ref 39–117)
Bilirubin, Direct: 0 mg/dL (ref 0.0–0.3)
Total Bilirubin: 0.5 mg/dL (ref 0.2–1.2)
Total Protein: 7.1 g/dL (ref 6.0–8.3)

## 2021-07-02 LAB — LIPID PANEL
Cholesterol: 241 mg/dL — ABNORMAL HIGH (ref 0–200)
HDL: 59.5 mg/dL (ref >39.00)
LDL Cholesterol: 158 mg/dL — ABNORMAL HIGH (ref 0–99)
NonHDL: 181.47
Total CHOL/HDL Ratio: 4
Triglycerides: 116 mg/dL (ref 0.0–149.0)
VLDL: 23.2 mg/dL (ref 0.0–40.0)

## 2021-07-02 LAB — BASIC METABOLIC PANEL
BUN: 18 mg/dL (ref 6–23)
CO2: 25 mEq/L (ref 19–32)
Calcium: 9.5 mg/dL (ref 8.4–10.5)
Chloride: 104 mEq/L (ref 96–112)
Creatinine, Ser: 1.29 mg/dL — ABNORMAL HIGH (ref 0.40–1.20)
GFR: 38.2 mL/min — ABNORMAL LOW (ref >60.00)
Glucose, Bld: 109 mg/dL — ABNORMAL HIGH (ref 70–99)
Potassium: 4.1 mEq/L (ref 3.5–5.1)
Sodium: 140 mEq/L (ref 135–145)

## 2021-07-02 LAB — HEMOGLOBIN A1C: Hgb A1c MFr Bld: 5.9 % (ref 4.6–6.5)

## 2021-07-02 LAB — TSH: TSH: 0.81 u[IU]/mL (ref 0.35–5.50)

## 2021-07-02 MED ORDER — AMLODIPINE BESYLATE 5 MG PO TABS: ORAL_TABLET | ORAL | 11 refills | Status: DC

## 2021-07-02 MED ORDER — PRAVASTATIN SODIUM 40 MG PO TABS: ORAL_TABLET | ORAL | 11 refills | Status: DC

## 2021-07-02 NOTE — Progress Notes (Signed)
? ?  Subjective:  ? ? Patient ID: Kathy Becker, female    DOB: August 23, 1935, 86 y.o.   MRN: [id number] ? ?HPI ?Here to follow up on issues. She feels well. Her BP is stable. Her GERD is well controlled. She is active and she still drives.  ? ? ?Review of Systems  ?Constitutional: Negative.   ?HENT: Negative.    ?Eyes: Negative.   ?Respiratory: Negative.    ?Cardiovascular: Negative.   ?Gastrointestinal: Negative.   ?Genitourinary:  Negative for decreased urine volume, difficulty urinating, dyspareunia, dysuria, enuresis, flank pain, frequency, hematuria, pelvic pain and urgency.  ?Musculoskeletal: Negative.   ?Skin: Negative.   ?Neurological: Negative.  Negative for headaches.  ?Psychiatric/Behavioral: Negative.    ? ?   ?Objective:  ? Physical Exam ?Constitutional:   ?   General: She is not in acute distress. ?   Appearance: Normal appearance. She is well-developed.  ?HENT:  ?   Head: Normocephalic and atraumatic.  ?   Right Ear: External ear normal.  ?   Left Ear: External ear normal.  ?   Nose: Nose normal.  ?   Mouth/Throat:  ?   Pharynx: No oropharyngeal exudate.  ?Eyes:  ?   General: No scleral icterus. ?   Conjunctiva/sclera: Conjunctivae normal.  ?   Pupils: Pupils are equal, round, and reactive to light.  ?Neck:  ?   Thyroid: No thyromegaly.  ?   Vascular: No JVD.  ?Cardiovascular:  ?   Rate and Rhythm: Normal rate and regular rhythm.  ?   Heart sounds: Normal heart sounds. No murmur heard. ?  No friction rub. No gallop.  ?Pulmonary:  ?   Effort: Pulmonary effort is normal. No respiratory distress.  ?   Breath sounds: Normal breath sounds. No wheezing or rales.  ?Chest:  ?   Chest wall: No tenderness.  ?Abdominal:  ?   General: Bowel sounds are normal. There is no distension.  ?   Palpations: Abdomen is soft. There is no mass.  ?   Tenderness: There is no abdominal tenderness. There is no guarding or rebound.  ?Musculoskeletal:     ?   General: No tenderness. Normal range of motion.  ?   Cervical back: Normal  range of motion and neck supple.  ?Lymphadenopathy:  ?   Cervical: No cervical adenopathy.  ?Skin: ?   General: Skin is warm and dry.  ?   Findings: No erythema or rash.  ?Neurological:  ?   Mental Status: She is alert and oriented to person, place, and time.  ?   Cranial Nerves: No cranial nerve deficit.  ?   Motor: No abnormal muscle tone.  ?   Coordination: Coordination normal.  ?   Deep Tendon Reflexes: Reflexes are normal and symmetric. Reflexes normal.  ?Psychiatric:     ?   Behavior: Behavior normal.     ?   Thought Content: Thought content normal.     ?   Judgment: Judgment normal.  ? ? ? ? ? ?   ?Assessment & Plan:  ?She is doing well with issues like HTN, GERD, and asymptomatic CAD. She will see her nephrologist next week for the CKD. We will get fasting labs to check lipids, etc. We spent a total of ( 31  ) minutes reviewing records and discussing these issues.  ?Alysia Penna, MD ? ? ?

## 2021-07-09 DIAGNOSIS — M109 Gout, unspecified: Secondary | ICD-10-CM | POA: Diagnosis not present

## 2021-07-09 DIAGNOSIS — R7303 Prediabetes: Secondary | ICD-10-CM | POA: Diagnosis not present

## 2021-07-09 DIAGNOSIS — N1832 Chronic kidney disease, stage 3b: Secondary | ICD-10-CM | POA: Diagnosis not present

## 2021-07-09 DIAGNOSIS — K219 Gastro-esophageal reflux disease without esophagitis: Secondary | ICD-10-CM | POA: Diagnosis not present

## 2021-07-09 DIAGNOSIS — I129 Hypertensive chronic kidney disease with stage 1 through stage 4 chronic kidney disease, or unspecified chronic kidney disease: Secondary | ICD-10-CM | POA: Diagnosis not present

## 2021-07-10 DIAGNOSIS — R42 Dizziness and giddiness: Secondary | ICD-10-CM | POA: Diagnosis not present

## 2021-08-07 DIAGNOSIS — Z20822 Contact with and (suspected) exposure to covid-19: Secondary | ICD-10-CM | POA: Diagnosis not present

## 2021-08-14 ENCOUNTER — Telehealth (HOSPITAL_COMMUNITY): Payer: Self-pay

## 2021-08-14 NOTE — Telephone Encounter (Signed)
Called patient to attempt to schedule F/U appointment, patient stated only wanted to come in one time for her visits, which requires pushing one study out an extended time frame.  ? ?This can be done, and has been done per patient request. ?

## 2021-08-28 DIAGNOSIS — Z20822 Contact with and (suspected) exposure to covid-19: Secondary | ICD-10-CM | POA: Diagnosis not present

## 2021-08-30 DIAGNOSIS — Z20822 Contact with and (suspected) exposure to covid-19: Secondary | ICD-10-CM | POA: Diagnosis not present

## 2021-09-22 ENCOUNTER — Other Ambulatory Visit: Payer: Self-pay | Admitting: Family Medicine

## 2021-11-14 DIAGNOSIS — H31013 Macula scars of posterior pole (postinflammatory) (post-traumatic), bilateral: Secondary | ICD-10-CM | POA: Diagnosis not present

## 2021-12-06 DIAGNOSIS — R300 Dysuria: Secondary | ICD-10-CM | POA: Diagnosis not present

## 2021-12-06 DIAGNOSIS — N3001 Acute cystitis with hematuria: Secondary | ICD-10-CM | POA: Diagnosis not present

## 2021-12-21 ENCOUNTER — Other Ambulatory Visit: Payer: Self-pay | Admitting: Family Medicine

## 2022-01-17 DIAGNOSIS — N3001 Acute cystitis with hematuria: Secondary | ICD-10-CM | POA: Diagnosis not present

## 2022-01-17 DIAGNOSIS — R059 Cough, unspecified: Secondary | ICD-10-CM | POA: Diagnosis not present

## 2022-02-01 DIAGNOSIS — Z23 Encounter for immunization: Secondary | ICD-10-CM | POA: Diagnosis not present

## 2022-03-21 ENCOUNTER — Other Ambulatory Visit: Payer: Self-pay | Admitting: Family Medicine

## 2022-04-02 DIAGNOSIS — L570 Actinic keratosis: Secondary | ICD-10-CM | POA: Diagnosis not present

## 2022-04-02 DIAGNOSIS — L821 Other seborrheic keratosis: Secondary | ICD-10-CM | POA: Diagnosis not present

## 2022-04-02 DIAGNOSIS — D225 Melanocytic nevi of trunk: Secondary | ICD-10-CM | POA: Diagnosis not present

## 2022-04-02 DIAGNOSIS — L814 Other melanin hyperpigmentation: Secondary | ICD-10-CM | POA: Diagnosis not present

## 2022-05-09 DIAGNOSIS — N3001 Acute cystitis with hematuria: Secondary | ICD-10-CM | POA: Diagnosis not present

## 2022-05-09 DIAGNOSIS — R300 Dysuria: Secondary | ICD-10-CM | POA: Diagnosis not present

## 2022-05-28 DIAGNOSIS — Z23 Encounter for immunization: Secondary | ICD-10-CM | POA: Diagnosis not present

## 2022-06-03 ENCOUNTER — Ambulatory Visit (INDEPENDENT_AMBULATORY_CARE_PROVIDER_SITE_OTHER): Payer: Medicare Other

## 2022-06-03 VITALS — Ht 62.0 in | Wt 148.0 lb

## 2022-06-03 DIAGNOSIS — Z0000 Encounter for general adult medical examination without abnormal findings: Secondary | ICD-10-CM | POA: Diagnosis not present

## 2022-06-03 NOTE — Patient Instructions (Addendum)
Kathy Becker , Thank you for taking time to come for your Medicare Wellness Visit. I appreciate your ongoing commitment to your health goals. Please review the following plan we discussed and let me know if I can assist you in the future.   These are the goals we discussed:  Goals       Exercise 150 min/wk Moderate Activity      2 days a week back at gym.       No current goals (pt-stated)        This is a list of the screening recommended for you and due dates:  Health Maintenance  Topic Date Due   DTaP/Tdap/Td vaccine (1 - Tdap) Never done   COVID-19 Vaccine (4 - 2023-24 season) 06/19/2022*   Flu Shot  07/28/2022*   Zoster (Shingles) Vaccine (1 of 2) 09/01/2022*   DEXA scan (bone density measurement)  06/04/2023*   Medicare Annual Wellness Visit  06/04/2023   Pneumonia Vaccine  Completed   HPV Vaccine  Aged Out  *Topic was postponed. The date shown is not the original due date.    Advanced directives: Please bring a copy of your health care power of attorney and living will to the office to be added to your chart at your convenience.   Conditions/risks identified: None  Next appointment: Follow up in one year for your annual wellness visit     Preventive Care 65 Years and Older, Female Preventive care refers to lifestyle choices and visits with your health care provider that can promote health and wellness. What does preventive care include? A yearly physical exam. This is also called an annual well check. Dental exams once or twice a year. Routine eye exams. Ask your health care provider how often you should have your eyes checked. Personal lifestyle choices, including: Daily care of your teeth and gums. Regular physical activity. Eating a healthy diet. Avoiding tobacco and drug use. Limiting alcohol use. Practicing safe sex. Taking low-dose aspirin every day. Taking vitamin and mineral supplements as recommended by your health care provider. What happens during an  annual well check? The services and screenings done by your health care provider during your annual well check will depend on your age, overall health, lifestyle risk factors, and family history of disease. Counseling  Your health care provider may ask you questions about your: Alcohol use. Tobacco use. Drug use. Emotional well-being. Home and relationship well-being. Sexual activity. Eating habits. History of falls. Memory and ability to understand (cognition). Work and work Statistician. Reproductive health. Screening  You may have the following tests or measurements: Height, weight, and BMI. Blood pressure. Lipid and cholesterol levels. These may be checked every 5 years, or more frequently if you are over 72 years old. Skin check. Lung cancer screening. You may have this screening every year starting at age 55 if you have a 30-pack-year history of smoking and currently smoke or have quit within the past 15 years. Fecal occult blood test (FOBT) of the stool. You may have this test every year starting at age 34. Flexible sigmoidoscopy or colonoscopy. You may have a sigmoidoscopy every 5 years or a colonoscopy every 10 years starting at age 61. Hepatitis C blood test. Hepatitis B blood test. Sexually transmitted disease (STD) testing. Diabetes screening. This is done by checking your blood sugar (glucose) after you have not eaten for a while (fasting). You may have this done every 1-3 years. Bone density scan. This is done to screen for osteoporosis. You  may have this done starting at age 16. Mammogram. This may be done every 1-2 years. Talk to your health care provider about how often you should have regular mammograms. Talk with your health care provider about your test results, treatment options, and if necessary, the need for more tests. Vaccines  Your health care provider may recommend certain vaccines, such as: Influenza vaccine. This is recommended every year. Tetanus,  diphtheria, and acellular pertussis (Tdap, Td) vaccine. You may need a Td booster every 10 years. Zoster vaccine. You may need this after age 83. Pneumococcal 13-valent conjugate (PCV13) vaccine. One dose is recommended after age 36. Pneumococcal polysaccharide (PPSV23) vaccine. One dose is recommended after age 58. Talk to your health care provider about which screenings and vaccines you need and how often you need them. This information is not intended to replace advice given to you by your health care provider. Make sure you discuss any questions you have with your health care provider. Document Released: 05/12/2015 Document Revised: 01/03/2016 Document Reviewed: 02/14/2015 Elsevier Interactive Patient Education  2017 [employer] Prevention in the Home Falls can cause injuries. They can happen to people of all ages. There are many things you can do to make your home safe and to help prevent falls. What can I do on the outside of my home? Regularly fix the edges of walkways and driveways and fix any cracks. Remove anything that might make you trip as you walk through a door, such as a raised step or threshold. Trim any bushes or trees on the path to your home. Use bright outdoor lighting. Clear any walking paths of anything that might make someone trip, such as rocks or tools. Regularly check to see if handrails are loose or broken. Make sure that both sides of any steps have handrails. Any raised decks and porches should have guardrails on the edges. Have any leaves, snow, or ice cleared regularly. Use sand or salt on walking paths during winter. Clean up any spills in your garage right away. This includes oil or grease spills. What can I do in the bathroom? Use night lights. Install grab bars by the toilet and in the tub and shower. Do not use towel bars as grab bars. Use non-skid mats or decals in the tub or shower. If you need to sit down in the shower, use a plastic,  non-slip stool. Keep the floor dry. Clean up any water that spills on the floor as soon as it happens. Remove soap buildup in the tub or shower regularly. Attach bath mats securely with double-sided non-slip rug tape. Do not have throw rugs and other things on the floor that can make you trip. What can I do in the bedroom? Use night lights. Make sure that you have a light by your bed that is easy to reach. Do not use any sheets or blankets that are too big for your bed. They should not hang down onto the floor. Have a firm chair that has side arms. You can use this for support while you get dressed. Do not have throw rugs and other things on the floor that can make you trip. What can I do in the kitchen? Clean up any spills right away. Avoid walking on wet floors. Keep items that you use a lot in easy-to-reach places. If you need to reach something above you, use a strong step stool that has a grab bar. Keep electrical cords out of the way. Do not use floor  polish or wax that makes floors slippery. If you must use wax, use non-skid floor wax. Do not have throw rugs and other things on the floor that can make you trip. What can I do with my stairs? Do not leave any items on the stairs. Make sure that there are handrails on both sides of the stairs and use them. Fix handrails that are broken or loose. Make sure that handrails are as long as the stairways. Check any carpeting to make sure that it is firmly attached to the stairs. Fix any carpet that is loose or worn. Avoid having throw rugs at the top or bottom of the stairs. If you do have throw rugs, attach them to the floor with carpet tape. Make sure that you have a light switch at the top of the stairs and the bottom of the stairs. If you do not have them, ask someone to add them for you. What else can I do to help prevent falls? Wear shoes that: Do not have high heels. Have rubber bottoms. Are comfortable and fit you well. Are closed  at the toe. Do not wear sandals. If you use a stepladder: Make sure that it is fully opened. Do not climb a closed stepladder. Make sure that both sides of the stepladder are locked into place. Ask someone to hold it for you, if possible. Clearly mark and make sure that you can see: Any grab bars or handrails. First and last steps. Where the edge of each step is. Use tools that help you move around (mobility aids) if they are needed. These include: Canes. Walkers. Scooters. Crutches. Turn on the lights when you go into a dark area. Replace any light bulbs as soon as they burn out. Set up your furniture so you have a clear path. Avoid moving your furniture around. If any of your floors are uneven, fix them. If there are any pets around you, be aware of where they are. Review your medicines with your doctor. Some medicines can make you feel dizzy. This can increase your chance of falling. Ask your doctor what other things that you can do to help prevent falls. This information is not intended to replace advice given to you by your health care provider. Make sure you discuss any questions you have with your health care provider. Document Released: 02/09/2009 Document Revised: 09/21/2015 Document Reviewed: 05/20/2014 Elsevier Interactive Patient Education  2017 [employer].

## 2022-06-03 NOTE — Progress Notes (Signed)
Subjective:   Kathy Becker is a 87 y.o. female who presents for Medicare Annual (Subsequent) preventive examination.  Review of Systems  Cardiac Risk Factors include: advanced age (>7mn, >>64women);hypertensionVirtual Visit via Telephone Note  I connected with  Kathy Becker on 06/03/22 at  1:00 PM EST by telephone and verified that I am speaking with the correct person using two identifiers.  Location: Patient: Home Provider: Office Persons participating in the virtual visit: patient/Nurse Health Advisor   I discussed the limitations, risks, security and privacy concerns of performing an evaluation and management service by telephone and the availability of in person appointments. The patient expressed understanding and agreed to proceed.  Interactive audio and video telecommunications were attempted between this nurse and patient, however failed, due to patient having technical difficulties OR patient did not have access to video capability.  We continued and completed visit with audio only.  Some vital signs may be absent or patient reported.   BCriselda Peaches LPN      Objective:    Today's Vitals   06/03/22 1427  Weight: 148 lb (67.1 kg)  Height: '5\' 2"'$  (1.575 m)   Body mass index is 27.07 kg/m.     06/03/2022    2:36 PM 05/31/2021    1:23 PM 09/14/2018   10:37 AM 08/13/2018    8:51 AM 07/20/2018    7:00 PM 07/10/2018    1:01 PM 07/02/2018    1:47 PM  Advanced Directives  Does Patient Have a Medical Advance Directive? Yes Yes No Yes Yes Yes Yes  Type of AParamedicof ADarien DowntownLiving will HRobertsLiving will  HAshlandLiving will HFairmontLiving will HWaiohinuLiving will HSeligmanLiving will  Does patient want to make changes to medical advance directive?  No - Patient declined  No - Patient declined No - Patient declined    Copy of HColumbinein Chart? No - copy requested No - copy requested    No - copy requested   Would patient like information on creating a medical advance directive?   No - Patient declined        Current Medications (verified) Outpatient Encounter Medications as of 06/03/2022  Medication Sig   amLODipine (NORVASC) 5 MG tablet TAKE 1 TABLET(5 MG) BY MOUTH DAILY   aspirin EC 81 MG tablet Take 81 mg by mouth at bedtime.   doxycycline (PERIOSTAT) 20 MG tablet Take by mouth.   lisinopril (ZESTRIL) 20 MG tablet TAKE 1 TABLET(20 MG) BY MOUTH DAILY   omeprazole (PRILOSEC) 40 MG capsule TAKE 1 CAPSULE(40 MG) BY MOUTH DAILY   pravastatin (PRAVACHOL) 40 MG tablet TAKE 1 TABLET(40 MG) BY MOUTH DAILY   triamcinolone cream (KENALOG) 0.1 % Apply 1 application topically daily as needed (rash).   No facility-administered encounter medications on file as of 06/03/2022.    Allergies (verified) Sulfonamide derivatives   History: Past Medical History:  Diagnosis Date   CAD (coronary artery disease)    stress test 08-15-08 showing lateral scar but no reversible ischemia    Hyperlipidemia    Hypertension    Myocardial infarction ([facility]    Renal insufficiency    Past Surgical History:  Procedure Laterality Date   BACK SURGERY  1988   lumbar spine    CATARACT EXTRACTION, BILATERAL     per Dr. GKaty Fitch   COLONOSCOPY  never   she refuses to  ever get one    DILATION AND CURETTAGE OF UTERUS     ENDARTERECTOMY Left 07/20/2018   Procedure: ENDARTERECTOMY CAROTID LEFT;  Surgeon: Elam Dutch, MD;  Location: [facility] OR;  Service: Vascular;  Laterality: Left;   PATCH ANGIOPLASTY Left 07/20/2018   Procedure: Isabel;  Surgeon: Elam Dutch, MD;  Location: [facility] OR;  Service: Vascular;  Laterality: Left;   TONSILLECTOMY     Family History  Problem Relation Age of Onset   Coronary artery disease Other    Social History   Socioeconomic History    Marital status: Legally Separated    Spouse name: Not on file   Number of children: Not on file   Years of education: Not on file   Highest education level: Not on file  Occupational History   Not on file  Tobacco Use   Smoking status: Former   Smokeless tobacco: Never  Vaping Use   Vaping Use: Never used  Substance and Sexual Activity   Alcohol use: No    Alcohol/week: 0.0 standard drinks of alcohol   Drug use: No   Sexual activity: Not on file  Other Topics Concern   Not on file  Social History Narrative   Not on file   Social Determinants of Health   Financial Resource Strain: Low Risk  (06/03/2022)   Overall Financial Resource Strain (CARDIA)    Difficulty of Paying Living Expenses: Not hard at all  Food Insecurity: No Food Insecurity (06/03/2022)   Hunger Vital Sign    Worried About Running Out of Food in the Last Year: Never true    Ran Out of Food in the Last Year: Never true  Transportation Needs: No Transportation Needs (06/03/2022)   PRAPARE - Hydrologist (Medical): No    Lack of Transportation (Non-Medical): No  Physical Activity: Inactive (06/03/2022)   Exercise Vital Sign    Days of Exercise per Week: 0 days    Minutes of Exercise per Session: 0 min  Stress: No Stress Concern Present (06/03/2022)   [place]    Feeling of Stress : Not at all  Social Connections: Moderately Integrated (06/03/2022)   Social Connection and Isolation Panel [NHANES]    Frequency of Communication with Friends and Family: More than three times a week    Frequency of Social Gatherings with Friends and Family: More than three times a week    Attends Religious Services: More than 4 times per year    Active Member of [employer] or Organizations: Yes    Attends Music therapist: More than 4 times per year    Marital Status: Divorced    Tobacco Counseling Counseling given: Not  Answered   Clinical Intake:  Pre-visit preparation completed: No  Pain : No/denies pain     BMI - recorded: 27.07 Nutritional Status: BMI 25 -29 Overweight Nutritional Risks: None Diabetes: No  How often do you need to have someone help you when you read instructions, pamphlets, or other written materials from your doctor or pharmacy?: 1 - Never  Diabetic?  No  Interpreter Needed?: No  Information entered by :: Rolene Arbour LPN   Activities of Daily Living    06/03/2022    2:33 PM 05/30/2022    9:58 AM  In your present state of health, do you have any difficulty performing the following activities:  Hearing? 0 0  Vision?  0 0  Difficulty concentrating or making decisions? 0 0  Walking or climbing stairs? 0 0  Dressing or bathing? 0 0  Doing errands, shopping? 0 0  Preparing Food and eating ? N N  Using the Toilet? N N  In the past six months, have you accidently leaked urine? Y Y  Do you have problems with loss of bowel control? N N  Managing your Medications? N N  Managing your Finances? N N  Housekeeping or managing your Housekeeping? N N    Patient Care Team: Laurey Morale, MD as PCP - General  Indicate any recent Medical Services you may have received from other than [facility] providers in the past year (date may be approximate).     Assessment:   This is a routine wellness examination for [place].  Hearing/Vision screen Hearing Screening - Comments:: Denies hearing difficulties   Vision Screening - Comments:: Wears rx glasses - up to date with routine eye exams with  [facility]  Dietary issues and exercise activities discussed: Exercise limited by: None identified   Goals Addressed               This Visit's Progress     No current goals (pt-stated)         Depression Screen    06/03/2022    2:32 PM 07/02/2021    9:26 AM 05/31/2021    1:16 PM 06/22/2018    8:59 AM 06/20/2017    9:51 AM 06/20/2017    8:40 AM 06/20/2015   10:14 AM  PHQ 2/9 Scores   PHQ - 2 Score 0 0 0 1 0 0 0  PHQ- 9 Score  0         Fall Risk    06/03/2022    2:35 PM 05/30/2022    9:58 AM 07/02/2021    9:26 AM 05/31/2021    1:21 PM 05/30/2021    2:00 PM  [place] in the past year? 0 0 0 0 1  Number falls in past yr: 0 0 0 0 1  Injury with Fall? 0 0 0 0 0  Risk for fall due to : No Fall Risks  No Fall Risks No Fall Risks   Follow up Falls prevention discussed   Falls evaluation completed     FALL RISK PREVENTION PERTAINING TO THE HOME:  Any stairs in or around the home? Yes  If so, are there any without handrails? No  Home free of loose throw rugs in walkways, pet beds, electrical cords, etc? Yes  Adequate lighting in your home to reduce risk of falls? Yes   ASSISTIVE DEVICES UTILIZED TO PREVENT FALLS:  Life alert? No  Use of a cane, walker or w/c? No  Grab bars in the bathroom? Yes  Shower chair or bench in shower? No  Elevated toilet seat or a handicapped toilet? No   TIMED UP AND GO:  Was the test performed? No . Audio Visit   Cognitive Function:        06/03/2022    2:36 PM 05/31/2021    1:22 PM  6CIT Screen  What Year? 0 points 0 points  What month? 0 points 0 points  What time? 0 points 0 points  Count back from 20 0 points 0 points  Months in reverse 0 points 0 points  Repeat phrase 0 points 0 points  Total Score 0 points 0 points    Immunizations Immunization History  Administered Date(s)  Administered   Influenza Split 02/10/2013   Influenza, High Dose Seasonal PF 12/20/2016, 01/21/2018   Influenza-Unspecified 01/27/2014, 02/16/2021   PFIZER Comirnaty(Gray Top)Covid-19 Tri-Sucrose Vaccine 06/03/2019, 06/24/2019, 02/25/2020   Pneumococcal Conjugate-13 06/20/2015   Pneumococcal Polysaccharide-23 06/20/2017   Zoster, Live 03/26/2011    TDAP status: Due, Education has been provided regarding the importance of this vaccine. Advised may receive this vaccine at local pharmacy or Health Dept. Aware to provide a copy of the  vaccination record if obtained from local pharmacy or Health Dept. Verbalized acceptance and understanding.  Flu Vaccine status: Completed at today's visit  Pneumococcal vaccine status: Declined,  Education has been provided regarding the importance of this vaccine but patient still declined. Advised may receive this vaccine at local pharmacy or Health Dept. Aware to provide a copy of the vaccination record if obtained from local pharmacy or Health Dept. Verbalized acceptance and understanding.   Covid-19 vaccine status: Completed vaccines  Qualifies for Shingles Vaccine? Yes   Zostavax completed Yes   Shingrix Completed?: Yes  Screening Tests Health Maintenance  Topic Date Due   DTaP/Tdap/Td (1 - Tdap) Never done   COVID-19 Vaccine (4 - 2023-24 season) 06/19/2022 (Originally 12/28/2021)   INFLUENZA VACCINE  07/28/2022 (Originally 11/27/2021)   Zoster Vaccines- Shingrix (1 of 2) 09/01/2022 (Originally 04/30/1987)   DEXA SCAN  06/04/2023 (Originally 04/29/2002)   Medicare Annual Wellness (AWV)  06/04/2023   Pneumonia Vaccine 52+ Years old  Completed   HPV VACCINES  Aged Out    Health Maintenance  Health Maintenance Due  Topic Date Due   DTaP/Tdap/Td (1 - Tdap) Never done    Colorectal cancer screening: No longer required.   Mammogram status: No longer required due to Age.  Bone Density status: Ordered Deferred. Pt provided with contact info and advised to call to schedule appt.  Lung Cancer Screening: (Low Dose CT Chest recommended if Age 27-80 years, 30 pack-year currently smoking OR have quit w/in 15years.) does not qualify.     Additional Screening:  Hepatitis C Screening: does not qualify; Completed   Vision Screening: Recommended annual ophthalmology exams for early detection of glaucoma and other disorders of the eye. Is the patient up to date with their annual eye exam?  Yes  Who is the provider or what is the name of the office in which the patient attends annual eye  exams? [facility] If pt is not established with a provider, would they like to be referred to a provider to establish care? No .   Dental Screening: Recommended annual dental exams for proper oral hygiene  Community Resource Referral / Chronic Care Management:  CRR required this visit?  No   CCM required this visit?  No      Plan:     I have personally reviewed and noted the following in the patient's chart:   Medical and social history Use of alcohol, tobacco or illicit drugs  Current medications and supplements including opioid prescriptions. Patient is not currently taking opioid prescriptions. Functional ability and status Nutritional status Physical activity Advanced directives List of other physicians Hospitalizations, surgeries, and ER visits in previous 12 months Vitals Screenings to include cognitive, depression, and falls Referrals and appointments  In addition, I have reviewed and discussed with patient certain preventive protocols, quality metrics, and best practice recommendations. A written personalized care plan for preventive services as well as general preventive health recommendations were provided to patient.     [name], LPN   06/05/9483  Nurse Notes: None

## 2022-06-13 ENCOUNTER — Other Ambulatory Visit: Payer: Self-pay | Admitting: *Deleted

## 2022-06-13 DIAGNOSIS — I713 Abdominal aortic aneurysm, ruptured, unspecified: Secondary | ICD-10-CM

## 2022-06-13 DIAGNOSIS — I6523 Occlusion and stenosis of bilateral carotid arteries: Secondary | ICD-10-CM

## 2022-06-25 ENCOUNTER — Other Ambulatory Visit: Payer: Self-pay | Admitting: Family Medicine

## 2022-06-25 DIAGNOSIS — E782 Mixed hyperlipidemia: Secondary | ICD-10-CM

## 2022-06-25 DIAGNOSIS — I10 Essential (primary) hypertension: Secondary | ICD-10-CM

## 2022-06-25 NOTE — Progress Notes (Unsigned)
HISTORY AND PHYSICAL     CC:  follow up. Requesting Provider:  Laurey Morale, MD  HPI: This is a 87 y.o. female here for follow up for carotid artery stenosis.  Pt is s/p left CEA for asymptomatic carotid artery stenosis on 07/20/2018 by Dr. Oneida Alar.  She also has hx of AAA last measuring 3cm in 2021.  Pt was last seen 03/21/2021 and at that time she was doing well without neurological sx.  She was encouraged to quit smoking.   Pt returns today for follow up.    Pt *** any amaurosis fugax, speech difficulties, weakness, numbness, paralysis or clumsiness or facial droop.    ***  The pt is on a statin for cholesterol management.  The pt is on a daily aspirin.   Other AC:  none The pt is on CCB, ACEI for hypertension.   The pt does not have diabetes Tobacco hx:  former  Pt does *** have family hx of AAA.  Past Medical History:  Diagnosis Date   CAD (coronary artery disease)    stress test 08-15-08 showing lateral scar but no reversible ischemia    Hyperlipidemia    Hypertension    Myocardial infarction [facility])    Renal insufficiency     Past Surgical History:  Procedure Laterality Date   BACK SURGERY  1988   lumbar spine    CATARACT EXTRACTION, BILATERAL     per Dr. Katy Fitch    COLONOSCOPY  never   she refuses to ever get one    DILATION AND CURETTAGE OF UTERUS     ENDARTERECTOMY Left 07/20/2018   Procedure: ENDARTERECTOMY CAROTID LEFT;  Surgeon: Elam Dutch, MD;  Location: [place];  Service: Vascular;  Laterality: Left;   PATCH ANGIOPLASTY Left 07/20/2018   Procedure: [place];  Surgeon: Elam Dutch, MD;  Location: [facility] OR;  Service: Vascular;  Laterality: Left;   TONSILLECTOMY      Allergies  Allergen Reactions   Sulfonamide Derivatives     Confusion    Current Outpatient Medications  Medication Sig Dispense Refill   amLODipine (NORVASC) 5 MG tablet TAKE 1 TABLET(5 MG) BY MOUTH DAILY 30 tablet 11    aspirin EC 81 MG tablet Take 81 mg by mouth at bedtime.     doxycycline (PERIOSTAT) 20 MG tablet Take by mouth.     lisinopril (ZESTRIL) 20 MG tablet TAKE 1 TABLET(20 MG) BY MOUTH DAILY 30 tablet 11   omeprazole (PRILOSEC) 40 MG capsule TAKE 1 CAPSULE(40 MG) BY MOUTH DAILY 90 capsule 0   pravastatin (PRAVACHOL) 40 MG tablet TAKE 1 TABLET(40 MG) BY MOUTH DAILY 30 tablet 11   triamcinolone cream (KENALOG) 0.1 % Apply 1 application topically daily as needed (rash).     No current facility-administered medications for this visit.    Family History  Problem Relation Age of Onset   Coronary artery disease Other     Social History   Socioeconomic History   Marital status: Legally Separated    Spouse name: Not on file   Number of children: Not on file   Years of education: Not on file   Highest education level: Not on file  Occupational History   Not on file  Tobacco Use   Smoking status: Former   Smokeless tobacco: Never  Vaping Use   Vaping Use: Never used  Substance and Sexual Activity   Alcohol use: No    Alcohol/week: 0.0 standard drinks of  alcohol   Drug use: No   Sexual activity: Not on file  Other Topics Concern   Not on file  Social History Narrative   Not on file   Social Determinants of Health   Financial Resource Strain: Low Risk  (06/03/2022)   Overall Financial Resource Strain (CARDIA)    Difficulty of Paying Living Expenses: Not hard at all  Food Insecurity: No Food Insecurity (06/03/2022)   Hunger Vital Sign    Worried About Running Out of Food in the Last Year: Never true    Ran Out of Food in the Last Year: Never true  Transportation Needs: No Transportation Needs (06/03/2022)   PRAPARE - Hydrologist (Medical): No    Lack of Transportation (Non-Medical): No  Physical Activity: Inactive (06/03/2022)   Exercise Vital Sign    Days of Exercise per Week: 0 days    Minutes of Exercise per Session: 0 min  Stress: No Stress Concern  Present (06/03/2022)   [place]    Feeling of Stress : Not at all  Social Connections: Moderately Integrated (06/03/2022)   Social Connection and Isolation Panel [NHANES]    Frequency of Communication with Friends and Family: More than three times a week    Frequency of Social Gatherings with Friends and Family: More than three times a week    Attends Religious Services: More than 4 times per year    Active Member of [employer] or Organizations: Yes    Attends Music therapist: More than 4 times per year    Marital Status: Divorced  Intimate Partner Violence: Not At Risk (06/03/2022)   Humiliation, Afraid, Rape, and Kick questionnaire    Fear of Current or Ex-Partner: No    Emotionally Abused: No    Physically Abused: No    Sexually Abused: No     REVIEW OF SYSTEMS:  *** '[X]'$  denotes positive finding, '[ ]'$  denotes negative finding Cardiac  Comments:  Chest pain or chest pressure:    Shortness of breath upon exertion:    Short of breath when lying flat:    Irregular heart rhythm:        Vascular    Pain in calf, thigh, or hip brought on by ambulation:    Pain in feet at night that wakes you up from your sleep:     Blood clot in your veins:    Leg swelling:         Pulmonary    Oxygen at home:    Productive cough:     Wheezing:         Neurologic    Sudden weakness in arms or legs:     Sudden numbness in arms or legs:     Sudden onset of difficulty speaking or slurred speech:    Temporary loss of vision in one eye:     Problems with dizziness:         Gastrointestinal    Blood in stool:     Vomited blood:         Genitourinary    Burning when urinating:     Blood in urine:        Psychiatric    Major depression:         Hematologic    Bleeding problems:    Problems with blood clotting too easily:        Skin    Rashes or ulcers:  Constitutional    Fever or chills:      PHYSICAL  EXAMINATION:  ***  General:  WDWN in NAD; vital signs documented above Gait: Not observed HENT: WNL, normocephalic Pulmonary: normal non-labored breathing Cardiac: {Desc; regular/irreg:14544} HR, {With/Without:20273} carotid bruit*** Abdomen: soft, NT; aortic pulse is *** palpable Skin: {With/Without:20273} rashes Vascular Exam/Pulses:  Right Left  Radial {Exam; arterial pulse strength 0-4:30167} {Exam; arterial pulse strength 0-4:30167}  Popliteal {Exam; arterial pulse strength 0-4:30167} {Exam; arterial pulse strength 0-4:30167}  DP {Exam; arterial pulse strength 0-4:30167} {Exam; arterial pulse strength 0-4:30167}  PT {Exam; arterial pulse strength 0-4:30167} {Exam; arterial pulse strength 0-4:30167}   Extremities: {With/Without:20273} open wounds Musculoskeletal: no muscle wasting or atrophy  Neurologic: A&O X 3; moving all extremities equally; speech is fluent/normal Psychiatric:  The pt has {Desc; normal/abnormal:11317::"Normal"} affect.   Non-Invasive Vascular Imaging:   Carotid Duplex on 06/26/2022 Right:  ***% ICA stenosis Left:  ***% ICA stenosis ***  AAA duplex 06/26/2022: ***  Previous Carotid duplex on 03/21/2021: Right: 1-39% ICA stenosis Left:   no stenosis  Previous AAA duplex 09/22/2019: Abdominal Aorta Findings:  +-----------+-------+----------+----------+--------+--------+--------+  Location  AP (cm)Trans (cm)PSV (cm/s)WaveformThrombusComments  +-----------+-------+----------+----------+--------+--------+--------+  Supraceliac2.88  2.80      49        biphasic                  +-----------+-------+----------+----------+--------+--------+--------+  Proximal  2.39   2.48      53        biphasic                  +-----------+-------+----------+----------+--------+--------+--------+  Mid       1.67   1.80      92        biphasic                  +-----------+-------+----------+----------+--------+--------+--------+  Distal     3.07   3.06      70        biphasic                  +-----------+-------+----------+----------+--------+--------+--------+  RT CIA Prox1.3    1.0       146       biphasic                  +-----------+-------+----------+----------+--------+--------+--------+  LT CIA Prox0.9    1.0       188       biphasic                  +-----------+-------+----------+----------+--------+--------+--------+   Summary:  Abdominal Aorta: There is evidence of abnormal dilatation of the distal  Abdominal aorta. The largest aortic measurement is 3.1 cm. The largest  aortic diameter has increased compared to prior exam. Previous diameter measurement was 2.9 cm obtained on 07/09/2018.     ASSESSMENT/PLAN:: 87 y.o. female here for follow up carotid artery stenosis and s/p  left CEA for asymptomatic carotid artery stenosis on 07/20/2018 by Dr. Oneida Alar.  She also has hx of AAA last measuring 3cm in 2021.  -duplex today reveals *** -discussed s/s of stroke with pt and she understands should she develop any of these sx, she will go to the nearest ER or call 911. -pt will f/u in *** with carotid duplex -pt will call sooner should she have any issues. -continue statin/asa ***  Leontine Locket, [facility] [phone]  Clinic MD:  Donzetta Matters

## 2022-06-26 ENCOUNTER — Ambulatory Visit (HOSPITAL_COMMUNITY)
Admission: RE | Admit: 2022-06-26 | Discharge: 2022-06-26 | Disposition: A | Discharge status: Home / Self Care | Payer: Medicare Other | Source: Ambulatory Visit | Attending: Vascular Surgery | Admitting: Vascular Surgery

## 2022-06-26 ENCOUNTER — Ambulatory Visit (INDEPENDENT_AMBULATORY_CARE_PROVIDER_SITE_OTHER)
Admission: RE | Admit: 2022-06-26 | Discharge: 2022-06-26 | Disposition: A | Discharge status: Home / Self Care | Payer: Medicare Other | Source: Ambulatory Visit | Attending: Vascular Surgery | Admitting: Vascular Surgery

## 2022-06-26 ENCOUNTER — Ambulatory Visit (INDEPENDENT_AMBULATORY_CARE_PROVIDER_SITE_OTHER): Payer: Medicare Other | Admitting: Physician Assistant

## 2022-06-26 VITALS — BP 150/90 | HR 86 | Temp 97.5°F | Resp 20 | Ht 62.0 in | Wt 144.4 lb

## 2022-06-26 DIAGNOSIS — I771 Stricture of artery: Secondary | ICD-10-CM | POA: Diagnosis not present

## 2022-06-26 DIAGNOSIS — I713 Abdominal aortic aneurysm, ruptured, unspecified: Secondary | ICD-10-CM | POA: Insufficient documentation

## 2022-06-26 DIAGNOSIS — I6523 Occlusion and stenosis of bilateral carotid arteries: Secondary | ICD-10-CM | POA: Diagnosis not present

## 2022-06-26 DIAGNOSIS — I714 Abdominal aortic aneurysm, without rupture, unspecified: Secondary | ICD-10-CM

## 2022-06-28 ENCOUNTER — Encounter: Payer: Self-pay | Admitting: Emergency Medicine

## 2022-06-28 ENCOUNTER — Other Ambulatory Visit: Payer: Self-pay

## 2022-06-28 ENCOUNTER — Emergency Department: Payer: Medicare Other

## 2022-06-28 ENCOUNTER — Emergency Department
Admission: EM | Admit: 2022-06-28 | Discharge: 2022-06-28 | Disposition: A | Discharge status: Home / Self Care | Payer: Medicare Other | Attending: Student in an Organized Health Care Education/Training Program | Admitting: Student in an Organized Health Care Education/Training Program

## 2022-06-28 DIAGNOSIS — N183 Chronic kidney disease, stage 3 unspecified: Secondary | ICD-10-CM | POA: Diagnosis not present

## 2022-06-28 DIAGNOSIS — Z1152 Encounter for screening for COVID-19: Secondary | ICD-10-CM | POA: Diagnosis not present

## 2022-06-28 DIAGNOSIS — I509 Heart failure, unspecified: Secondary | ICD-10-CM | POA: Diagnosis not present

## 2022-06-28 DIAGNOSIS — I110 Hypertensive heart disease with heart failure: Secondary | ICD-10-CM | POA: Diagnosis not present

## 2022-06-28 DIAGNOSIS — I2510 Atherosclerotic heart disease of native coronary artery without angina pectoris: Secondary | ICD-10-CM | POA: Diagnosis not present

## 2022-06-28 DIAGNOSIS — I130 Hypertensive heart and chronic kidney disease with heart failure and stage 1 through stage 4 chronic kidney disease, or unspecified chronic kidney disease: Secondary | ICD-10-CM | POA: Diagnosis not present

## 2022-06-28 DIAGNOSIS — R0602 Shortness of breath: Secondary | ICD-10-CM | POA: Diagnosis not present

## 2022-06-28 DIAGNOSIS — J90 Pleural effusion, not elsewhere classified: Secondary | ICD-10-CM | POA: Insufficient documentation

## 2022-06-28 LAB — COMPREHENSIVE METABOLIC PANEL
ALT: 10 U/L (ref 0–44)
AST: 20 U/L (ref 15–41)
Albumin: 3.8 g/dL (ref 3.5–5.0)
Alkaline Phosphatase: 124 U/L (ref 38–126)
Anion gap: 11 (ref 5–15)
BUN: 14 mg/dL (ref 8–23)
CO2: 21 mmol/L — ABNORMAL LOW (ref 22–32)
Calcium: 8.8 mg/dL — ABNORMAL LOW (ref 8.9–10.3)
Chloride: 109 mmol/L (ref 98–111)
Creatinine, Ser: 1.12 mg/dL — ABNORMAL HIGH (ref 0.44–1.00)
GFR, Estimated: 48 mL/min — ABNORMAL LOW (ref >60)
Glucose, Bld: 143 mg/dL — ABNORMAL HIGH (ref 70–99)
Potassium: 3.6 mmol/L (ref 3.5–5.1)
Sodium: 141 mmol/L (ref 135–145)
Total Bilirubin: 0.4 mg/dL (ref 0.3–1.2)
Total Protein: 7.3 g/dL (ref 6.5–8.1)

## 2022-06-28 LAB — BRAIN NATRIURETIC PEPTIDE: B Natriuretic Peptide: 762.8 pg/mL — ABNORMAL HIGH (ref 0.0–100.0)

## 2022-06-28 LAB — CBC
HCT: 43.8 % (ref 36.0–46.0)
Hemoglobin: 13.5 g/dL (ref 12.0–15.0)
MCH: 27.8 pg (ref 26.0–34.0)
MCHC: 30.8 g/dL (ref 30.0–36.0)
MCV: 90.1 fL (ref 80.0–100.0)
Platelets: 160 10*3/uL (ref 150–400)
RBC: 4.86 MIL/uL (ref 3.87–5.11)
RDW: 13.7 % (ref 11.5–15.5)
WBC: 7.6 10*3/uL (ref 4.0–10.5)
nRBC: 0 % (ref 0.0–0.2)

## 2022-06-28 LAB — RESP PANEL BY RT-PCR (RSV, FLU A&B, COVID)  RVPGX2
Influenza A by PCR: NEGATIVE
Influenza B by PCR: NEGATIVE
Resp Syncytial Virus by PCR: NEGATIVE
SARS Coronavirus 2 by RT PCR: NEGATIVE

## 2022-06-28 LAB — TROPONIN I (HIGH SENSITIVITY): Troponin I (High Sensitivity): 14 ng/L (ref <18)

## 2022-06-28 MED ORDER — FUROSEMIDE 20 MG PO TABS: 20.0000 mg | ORAL_TABLET | Freq: Every day | ORAL | 0 refills | Status: DC

## 2022-06-28 MED ORDER — FUROSEMIDE 10 MG/ML IJ SOLN
40.0000 mg | Freq: Once | INTRAMUSCULAR | Status: AC
  Administered 2022-06-28: 40 mg via INTRAVENOUS
  Filled 2022-06-28: qty 4

## 2022-06-28 NOTE — ED Triage Notes (Signed)
Pt here with SOB that she states has gotten worse over the last day. Pt states she cannot catch her breath. Pt stable in triage.

## 2022-06-28 NOTE — Discharge Instructions (Signed)
You have a small amount of fluid in your lungs which is causing you to have trouble breathing while you walk.  You have been referred to the heart failure clinic, they should reach out to you to schedule an appointment.  It is very important that you follow-up with them.  Please return for any new, worsening, or change in symptoms or other concerns.  It was a pleasure caring for you today.

## 2022-06-28 NOTE — ED Provider Notes (Signed)
[facility] Provider Note    Event Date/Time   First MD Initiated Contact with Patient 06/28/22 1138     (approximate)   History   Shortness of Breath   HPI  Kathy Becker is a 87 y.o. female with a past medical history of carotid artery stenosis, CKD, hyperlipidemia, hypertension, coronary artery disease, history of myocardial infarction, who presents today for evaluation of shortness of breath.  Patient reports that this is new for her over the past 1 to 2 days.  She reports that her mailbox is 1/2 mile away and she is normally able to walk without difficulty.  She had a lot of difficulty walking there today and yesterday.  She denies any chest pain.  She reports that she used to be a smoker for 50 years, though she stopped 20 years ago.  She denies cough or hemoptysis.  She denies any weight loss or weight gain.  She denies abdominal pain or back pain.  She has not had any calf pain or swelling.  She denies orthopnea.  She reports dyspnea on exertion only.  No recent coughs or colds or infectious type symptoms.  Patient Active Problem List   Diagnosis Date Noted   AAA (abdominal aortic aneurysm) ([place]) 06/29/2020   GERD (gastroesophageal reflux disease) 06/29/2020   Carotid artery stenosis, symptomatic, left 07/20/2018   CKD (chronic kidney disease) stage 3, GFR 30-59 ml/min ([place]) 06/22/2018   Hyperlipidemia 06/17/2007   Essential hypertension 06/17/2007   MYOCARDIAL INFARCTION, HX OF 06/17/2007   Coronary atherosclerosis 06/17/2007          Physical Exam   Triage Vital Signs: ED Triage Vitals  Enc Vitals Group     BP 06/28/22 1025 135/80     Pulse Rate 06/28/22 1025 91     Resp 06/28/22 1025 18     Temp 06/28/22 1025 98.3 F (36.8 C)     Temp Source 06/28/22 1025 Oral     SpO2 06/28/22 1025 97 %     Weight 06/28/22 1023 144 lb 6.4 oz (65.5 kg)     Height 06/28/22 1023 '5\' 2"'$  (1.575 m)     Head Circumference --      Peak Flow --      Pain  Score 06/28/22 1025 0     Pain Loc --      Pain Edu? --      Excl. in [place]? --     Most recent vital signs: Vitals:   06/28/22 1025 06/28/22 1428  BP: 135/80 132/80  Pulse: 91 89  Resp: 18 18  Temp: 98.3 F (36.8 C) 98.1 F (36.7 C)  SpO2: 97% 97%    Physical Exam Vitals and nursing note reviewed.  Constitutional:      General: Awake and alert. No acute distress.    Appearance: Normal appearance. The patient is normal weight.  HENT:     Head: Normocephalic and atraumatic.     Mouth: Mucous membranes are moist.  Eyes:     General: PERRL. Normal EOMs        Right eye: No discharge.        Left eye: No discharge.     Conjunctiva/sclera: Conjunctivae normal.  Cardiovascular:     Rate and Rhythm: Normal rate and regular rhythm.     Pulses: Normal pulses.  Pulmonary:     Effort: Pulmonary effort is normal. No respiratory distress.  Able to speak easily in complete sentences.    Breath  sounds:   Faint crackles bibasilarly Abdominal:     Abdomen is soft. There is no abdominal tenderness. No rebound or guarding. No distention. Musculoskeletal:        General: No swelling. Normal range of motion.     Cervical back: Normal range of motion and neck supple.  No lower extremity edema Skin:    General: Skin is warm and dry.     Capillary Refill: Capillary refill takes less than 2 seconds.     Findings: No rash.  Neurological:     Mental Status: The patient is awake and alert.      ED Results / Procedures / Treatments   Labs (all labs ordered are listed, but only abnormal results are displayed) Labs Reviewed  COMPREHENSIVE METABOLIC PANEL - Abnormal; Notable for the following components:      Result Value   CO2 21 (*)    Glucose, Bld 143 (*)    Creatinine, Ser 1.12 (*)    Calcium 8.8 (*)    GFR, Estimated 48 (*)    All other components within normal limits  BRAIN NATRIURETIC PEPTIDE - Abnormal; Notable for the following components:   B Natriuretic Peptide 762.8 (*)     All other components within normal limits  RESP PANEL BY RT-PCR (RSV, FLU A&B, COVID)  RVPGX2  CBC  TROPONIN I (HIGH SENSITIVITY)  TROPONIN I (HIGH SENSITIVITY)     EKG     RADIOLOGY I independently reviewed and interpreted imaging and agree with radiologists findings.     PROCEDURES:  Critical Care performed:   Procedures   MEDICATIONS ORDERED IN ED: Medications  furosemide (LASIX) injection 40 mg (40 mg Intravenous Given 06/28/22 1325)     IMPRESSION / MDM / ASSESSMENT AND PLAN / ED COURSE  I reviewed the triage vital signs and the nursing notes.   Differential diagnosis includes, but is not limited to, pleural effusion, pericarditis, pericardial effusion, acute coronary syndrome, congestive heart failure, pulmonary embolism, pneumonia.  Patient presents to the emergency department awake and alert, hemodynamically stable and afebrile.  She is able to speak easily in complete sentences and demonstrates no accessory muscle use or increased work of breathing.  I reviewed the patient's chart.  Patient was seen in the vascular surgery clinic on 06/26/2022.  She had duplex ultrasound of her bilateral carotid arteries which revealed a right ICA stenosis measuring 40 to 59%, left without stenosis.  She is without symptoms, plan to follow-up in 1 year with repeat carotid duplex.  Her AAA had a slight increase in measurement from 3.06 cm to 3.2 cm plan for follow-up in 1 year unless she develops new back or abdominal pain.  Per review of care everywhere, patient had an x-ray of her chest on 04/16/2021.  I am unable to view the images, however the radiology read demonstrates no pleural effusions.  Similarly, she had a chest x-ray on 08/26/2019 which also did not reveal any pleural effusions.  Chest x-ray reveals bilateral pleural effusions.  Etiology of pleural effusions is unclear at this time, the most likely etiology is heart failure, infection, malignancy.  However, and is unclear  if this is exudative or transudative at this time.  At this time, she is hemodynamically stable and with a normal oxygen saturation of 97% on room air, no indication for emergent thoracentesis.  Troponin is within normal limits.  BNP is elevated.  Per chart review, patient had an echocardiogram in 2020 that demonstrated an EF of 40 to 45%,  therefore it appears that she has had mild heart failure for the past couple of years that may have perhaps been undiagnosed.  There are no previous BNPs to compare today's BNP to.  After discussion with Dr. Quentin Cornwall, patient was given a small amount of IV Lasix in which she responded well with good urine output.  Patient reports that she feels significantly improved after this.  She was able to ambulate unassisted without desaturating, and maintained an O2 level of 97% while ambulating.  She reports that she feels much better walking after the Lasix than she did before the Lasix.  I recommended that she follow-up with the outpatient heart failure clinic, and referral was sent.  She was started on low-dose Lasix in the meantime.  We did discuss very strict return precautions and the importance of close outpatient follow-up.  Patient understands and agrees with plan.  She was discharged in stable condition.  Patient was discussed with Dr. Quentin Cornwall who agrees with assessment and plan.  Patient's presentation is most consistent with acute complicated illness / injury requiring diagnostic workup.   Clinical Course as of 06/28/22 1800  Fri Jun 28, 2022  1421 Patient with good urine output, ambulating around the emergency department in no acute distress [JP]    Clinical Course User Index [JP] Evola Hollis, Clarnce Flock, PA-C     FINAL CLINICAL IMPRESSION(S) / ED DIAGNOSES   Final diagnoses:  Acute on chronic heart failure, unspecified heart failure type ([place])  Pleural effusion     Rx / DC Orders   ED Discharge Orders          Ordered    AMB referral to CHF clinic         06/28/22 1431    furosemide (LASIX) 20 MG tablet  Daily        06/28/22 1436             Note:  This document was prepared using Dragon voice recognition software and may include unintentional dictation errors.   Marquette Old, PA-C 06/28/22 1800    Merlyn Lot, MD 06/28/22 [phone]

## 2022-07-01 ENCOUNTER — Telehealth: Payer: Self-pay

## 2022-07-01 NOTE — Transitions of Care (Post Inpatient/ED Visit) (Signed)
   07/01/2022  Name: Kathy Becker MRN: [id number] DOB: [birth year]  Today's TOC FU Call Status: Today's TOC FU Call Status:: Successful TOC FU Call Competed TOC FU Call Complete Date: 07/01/22  Red on EMMI-ED Discharge Alert Date & Reason:06/29/22- "Scheduled follow-up appt? No" Confirmed with patient that she does have follow up appts in place.  Transition Care Management Follow-up Telephone Call Date of Discharge: 06/28/22 Discharge Facility[facility]) Type of Discharge: Emergency Department Reason for ED Visit: Other: ("acute on chronic HF") How have you been since you were released from the hospital?: Better (Patient voices that she is feeling better. She denies sany SOB, edema or other cardiac sxs. She voices she is taking it easy as she has not regained her full strength and endurance back yet.) Any questions or concerns?: No  Items Reviewed: Did you receive and understand the discharge instructions provided?: Yes Medications obtained and verified?: Yes (Medications Reviewed) Any new allergies since your discharge?: No Dietary orders reviewed?: Yes Type of Diet Ordered:: low salt/heart healthy Do you have support at home?: Yes People in Home: child(ren), adult Name of Support/Comfort Primary Source: Kathy Becker and Equipment/Supplies: [place] Ordered?: NA Any new equipment or medical supplies ordered?: NA  Functional Questionnaire: Do you need assistance with bathing/showering or dressing?: No Do you need assistance with meal preparation?: No Do you need assistance with eating?: No Do you have difficulty maintaining continence: No Do you need assistance with getting out of bed/getting out of a chair/moving?: No Do you have difficulty managing or taking your medications?: No  Folllow up appointments reviewed: PCP Follow-up appointment confirmed?: Yes Date of PCP follow-up appointment?: 07/04/22 Follow-up  Provider: Dr. Sarajane Becker Specialist [facility] Follow-up appointment confirmed?: Yes Date of Specialist follow-up appointment?: 07/15/22 Follow-Up Specialty Provider:: Kathy Dolin Do you need transportation to your follow-up appointment?: No Do you understand care options if your condition(s) worsen?: Yes-patient verbalized understanding  SDOH Interventions Today    Flowsheet Row Most Recent Value  SDOH Interventions   Food Insecurity Interventions Intervention Not Indicated  Transportation Interventions Intervention Not Indicated      TOC Interventions Today    Flowsheet Row Most Recent Value  TOC Interventions   TOC Interventions Discussed/Reviewed TOC Interventions Discussed, Post discharge activity limitations per provider  [HF mgmt, wgt mgmt, ordered scale to be delivered to patient home for ehr to monitor wgt]      Interventions Today    Flowsheet Row Most Recent Value  Education Interventions   Education Provided Provided Education  Provided Verbal Education On Nutrition, Medication, When to see the doctor  Nutrition Interventions   Nutrition Discussed/Reviewed Nutrition Discussed, Fluid intake, Decreasing salt  Pharmacy Interventions   Pharmacy Dicussed/Reviewed Pharmacy Topics Discussed, Medications and their functions  Safety Interventions   Safety Discussed/Reviewed Safety Discussed, Fall Risk          Hetty Blend [facility] Care Management Care Management Community Coordinator Direct Phone: [phone] Toll Free: [phone] Fax: [phone]

## 2022-07-01 NOTE — Transitions of Care (Post Inpatient/ED Visit) (Signed)
   07/01/2022  Name: Kathy Becker MRN: [id number] DOB: 01-07-1936  Today's TOC FU Call Status: Today's TOC FU Call Status:: Unsuccessul Call (1st Attempt) Unsuccessful Call (1st Attempt) Date: 07/01/22 Scarlett Presto on EMMI-ED Discharge Alert Date & Reason:  06/29/22-  "Scheduled follow-up appt? No")  Attempted to reach the patient regarding the most recent Inpatient/ED visit.  Follow Up Plan: Additional outreach attempts will be made to reach the patient to complete the Transitions of Care (Post Inpatient/ED visit) call.      Enzo Montgomery, RN,BSN,CCM [facility] Care Management Care Management Community Coordinator Direct Phone: [phone] Toll Free: [phone] Fax: [phone]

## 2022-07-03 ENCOUNTER — Telehealth: Payer: Self-pay

## 2022-07-03 NOTE — Patient Outreach (Signed)
  Care Coordination   Follow Up Visit Note   07/03/2022 Name: Kathy Becker MRN: [id number] DOB: 07-Nov-1935  Kathy Becker is a 87 y.o. year old female who sees Kathy Morale, MD for primary care. I spoke with  Kathy Becker by phone today.  Red on EMMI-ED Discharge Alert Date & Reason: 07/02/22-"Sad, hopeless, anxious or empty? Yes"  RN CM spoke with patient regarding her response. She admits that yesterday she was "feeling a little down." She was upset that she was not able to go to church Sunday which she has not missed church in several years. She voices some frustration as to not being able to physically do al the things she used to could do and it is "hard for her to just sit still." Support and reassurance given. Patient voices she is feeling better today mood wise and is hopeful she will be able to return to church this Sunday.    SDOH assessments and interventions completed:  No     Care Coordination Interventions:  Yes, provided   Follow up plan:  Patient has already been assigned to RN CM care coordinator for ongoing support and education with mgmt of chronic conditions.     Encounter Outcome:  Pt. Visit Completed   Kathy Becker [facility] Care Management Care Management Community Coordinator Direct Phone: [phone] Toll Free: [phone] Fax: [phone]

## 2022-07-04 ENCOUNTER — Ambulatory Visit (INDEPENDENT_AMBULATORY_CARE_PROVIDER_SITE_OTHER): Payer: Medicare Other | Admitting: Family Medicine

## 2022-07-04 ENCOUNTER — Encounter: Payer: Self-pay | Admitting: Family Medicine

## 2022-07-04 VITALS — BP 128/80 | HR 62 | Temp 97.6°F | Ht 62.0 in | Wt 142.2 lb

## 2022-07-04 DIAGNOSIS — R739 Hyperglycemia, unspecified: Secondary | ICD-10-CM | POA: Diagnosis not present

## 2022-07-04 DIAGNOSIS — K219 Gastro-esophageal reflux disease without esophagitis: Secondary | ICD-10-CM

## 2022-07-04 DIAGNOSIS — I5043 Acute on chronic combined systolic (congestive) and diastolic (congestive) heart failure: Secondary | ICD-10-CM | POA: Diagnosis not present

## 2022-07-04 DIAGNOSIS — I6522 Occlusion and stenosis of left carotid artery: Secondary | ICD-10-CM

## 2022-07-04 DIAGNOSIS — E782 Mixed hyperlipidemia: Secondary | ICD-10-CM | POA: Diagnosis not present

## 2022-07-04 DIAGNOSIS — I252 Old myocardial infarction: Secondary | ICD-10-CM | POA: Diagnosis not present

## 2022-07-04 DIAGNOSIS — I10 Essential (primary) hypertension: Secondary | ICD-10-CM

## 2022-07-04 DIAGNOSIS — N1831 Chronic kidney disease, stage 3a: Secondary | ICD-10-CM | POA: Diagnosis not present

## 2022-07-04 DIAGNOSIS — I2510 Atherosclerotic heart disease of native coronary artery without angina pectoris: Secondary | ICD-10-CM

## 2022-07-04 LAB — LIPID PANEL
Cholesterol: 241 mg/dL — ABNORMAL HIGH (ref 0–200)
HDL: 61.7 mg/dL (ref >39.00)
LDL Cholesterol: 147 mg/dL — ABNORMAL HIGH (ref 0–99)
NonHDL: 179.14
Total CHOL/HDL Ratio: 4
Triglycerides: 162 mg/dL — ABNORMAL HIGH (ref 0.0–149.0)
VLDL: 32.4 mg/dL (ref 0.0–40.0)

## 2022-07-04 LAB — TSH: TSH: 0.78 u[IU]/mL (ref 0.35–5.50)

## 2022-07-04 LAB — HEMOGLOBIN A1C: Hgb A1c MFr Bld: 6 % (ref 4.6–6.5)

## 2022-07-04 MED ORDER — AMLODIPINE BESYLATE 5 MG PO TABS: 5.0000 mg | ORAL_TABLET | Freq: Every day | ORAL | 11 refills | Status: DC

## 2022-07-04 MED ORDER — OMEPRAZOLE 40 MG PO CPDR: DELAYED_RELEASE_CAPSULE | ORAL | 11 refills | Status: DC

## 2022-07-04 MED ORDER — FUROSEMIDE 20 MG PO TABS: 20.0000 mg | ORAL_TABLET | Freq: Every day | ORAL | 11 refills | Status: DC

## 2022-07-04 MED ORDER — LISINOPRIL 20 MG PO TABS: ORAL_TABLET | ORAL | 11 refills | Status: DC

## 2022-07-04 MED ORDER — PRAVASTATIN SODIUM 40 MG PO TABS: 40.0000 mg | ORAL_TABLET | Freq: Every day | ORAL | 11 refills | Status: DC

## 2022-07-04 NOTE — Progress Notes (Signed)
Subjective:    Patient ID: Kathy Becker, female    DOB: 04-Aug-1935, 87 y.o.   MRN: [id number]  HPI Here to follow up an ED visit on 06-28-22 for several days of SOB. No other symptoms. Her BP has been stable. Her EKG was fine. Her exam was normal. A CXR showed small bilateral pleural effusions. Her labs were remarkable for a BNP of 762, creatinine 1.12, and GFR 48. It was felt that she had some mild CHF so she was given IV Lasix. She immediately felt better. She was sent home on Lasix 20 mg daily. Since going home she has no more SOB. She still has a tiny bit of ankle swelling, but this is much better than before. Of note her last ECHO on 06-23-18 showed an EF of 40-45% with mild diastolic dysfunction. On 06-26-22 she went to the vascular lab and her carotid study showed stable 40-59% stenosis in the right ICA and the left ICA was clear. Her aorta showed a stable AAA at 3.2 cm of diameter.    Review of Systems  Constitutional: Negative.   Respiratory: Negative.    Cardiovascular:  Positive for leg swelling. Negative for chest pain and palpitations.  Gastrointestinal: Negative.   Genitourinary: Negative.   Neurological: Negative.   Psychiatric/Behavioral: Negative.         Objective:   Physical Exam Constitutional:      General: She is not in acute distress.    Appearance: Normal appearance. She is well-developed.  HENT:     Head: Normocephalic and atraumatic.     Right Ear: External ear normal.     Left Ear: External ear normal.     Nose: Nose normal.     Mouth/Throat:     Pharynx: No oropharyngeal exudate.  Eyes:     General: No scleral icterus.    Conjunctiva/sclera: Conjunctivae normal.     Pupils: Pupils are equal, round, and reactive to light.  Neck:     Thyroid: No thyromegaly.     Vascular: No JVD.  Cardiovascular:     Rate and Rhythm: Normal rate and regular rhythm.     Pulses: Normal pulses.     Heart sounds: Normal heart sounds. No murmur heard.    No friction rub.  No gallop.  Pulmonary:     Effort: Pulmonary effort is normal. No respiratory distress.     Breath sounds: Normal breath sounds. No wheezing or rales.  Chest:     Chest wall: No tenderness.  Abdominal:     General: Bowel sounds are normal. There is no distension.     Palpations: Abdomen is soft. There is no mass.     Tenderness: There is no abdominal tenderness. There is no guarding or rebound.  Musculoskeletal:        General: No tenderness. Normal range of motion.     Cervical back: Normal range of motion and neck supple.     Comments: Trace bilateral ankle edema   Lymphadenopathy:     Cervical: No cervical adenopathy.  Skin:    General: Skin is warm and dry.     Findings: No erythema or rash.  Neurological:     General: No focal deficit present.     Mental Status: She is alert and oriented to person, place, and time.     Cranial Nerves: No cranial nerve deficit.     Motor: No abnormal muscle tone.     Coordination: Coordination normal.     Deep  Tendon Reflexes: Reflexes are normal and symmetric. Reflexes normal.  Psychiatric:        Mood and Affect: Mood normal.        Behavior: Behavior normal.        Thought Content: Thought content normal.        Judgment: Judgment normal.           Assessment & Plan:  She had a recent exacerbation of her CHF, and she has responded well to the Lasix. She is fasting today so we will check lipids, TSH, and an A1c. We will order another ECHO. She is scheduled to see Darylene Price NP at [facility] on 07-15-22. We spent a total of (35   ) minutes reviewing records and discussing these issues.  Alysia Penna, MD

## 2022-07-08 ENCOUNTER — Ambulatory Visit: Payer: Self-pay

## 2022-07-08 NOTE — Patient Outreach (Addendum)
  Care Coordination   Initial Visit Note   07/08/2022 Name: Kathy Becker MRN: [id number] DOB: 02/04/1936  Kathy Becker is a 87 y.o. year old female who sees Laurey Morale, MD for primary care. I spoke with  Gypsy Lore Jager by phone today.  What matters to the patients health and wellness today?  Staying active. Heart failure management    Goals Addressed             This Visit's Progress    Heart Failure Management       Patient Goals/Self Care Activities: -Patient/Caregiver will self-administer medications as prescribed as evidenced by self-report/primary caregiver report  -Patient/Caregiver will attend all scheduled provider appointments as evidenced by clinician review of documented attendance to scheduled appointments and patient/caregiver report -Patient/Caregiver will call provider office for new concerns or questions as evidenced by review of documented incoming telephone call notes and patient report  -Weigh daily and record (notify MD with 3 lb weight gain over night or 5 lb in a week) -Adhere to low sodium diet  Patient confirmed she did receive her scale but does not know her last weight.  Discussed importance of weight for heart failure maintenance.   Interventions Today    Flowsheet Row Most Recent Value  General Interventions   General Interventions Discussed/Reviewed Doctor Visits  Doctor Visits Discussed/Reviewed Doctor Visits Discussed  Education Interventions   Education Provided Provided Education  Provided Verbal Education On Nutrition, Medication  Nutrition Interventions   Nutrition Discussed/Reviewed Nutrition Discussed, Decreasing salt, Fluid intake  Pharmacy Interventions   Pharmacy Dicussed/Reviewed Pharmacy Topics Discussed, Medications and their functions  Safety Interventions   Safety Discussed/Reviewed Safety Discussed              SDOH assessments and interventions completed:  Yes     Care Coordination Interventions:  Yes, provided    Follow up plan: Follow up call scheduled for 07/23/22    Encounter Outcome:  Pt. Visit Completed   Jone Baseman, RN, MSN [place] [facility] Care Management Coordinator Direct Line [phone]

## 2022-07-08 NOTE — Patient Instructions (Signed)
Visit Information  Thank you for taking time to visit with me today. Please don't hesitate to contact me if I can be of assistance to you.   Following are the goals we discussed today:   Goals Addressed             This Visit's Progress    Heart Failure Management       Patient Goals/Self Care Activities: -Patient/Caregiver will self-administer medications as prescribed as evidenced by self-report/primary caregiver report  -Patient/Caregiver will attend all scheduled provider appointments as evidenced by clinician review of documented attendance to scheduled appointments and patient/caregiver report -Patient/Caregiver will call provider office for new concerns or questions as evidenced by review of documented incoming telephone call notes and patient report  -Weigh daily and record (notify MD with 3 lb weight gain over night or 5 lb in a week) -Adhere to low sodium diet   Interventions Today    Flowsheet Row Most Recent Value  General Interventions   General Interventions Discussed/Reviewed Doctor Visits  Doctor Visits Discussed/Reviewed Doctor Visits Discussed  Education Interventions   Education Provided Provided Education  Provided Verbal Education On Nutrition, Medication  Nutrition Interventions   Nutrition Discussed/Reviewed Nutrition Discussed, Decreasing salt, Fluid intake  Pharmacy Interventions   Pharmacy Dicussed/Reviewed Pharmacy Topics Discussed, Medications and their functions  Safety Interventions   Safety Discussed/Reviewed Safety Discussed              Our next appointment is by telephone on 07/23/22 at 1030 am  Please call the care guide team at [phone] if you need to cancel or reschedule your appointment.   If you are experiencing a Mental Health or [place] or need someone to talk to, please call the Suicide and Crisis Lifeline: 988   The patient verbalized understanding of instructions, educational materials, and care plan  provided today and agreed to receive a mailed copy of patient instructions, educational materials, and care plan.   The patient has been provided with contact information for the care management team and has been advised to call with any health related questions or concerns.   Jone Baseman, RN, MSN [facility] Care Management Coordinator Direct Line [phone]

## 2022-07-13 NOTE — Progress Notes (Unsigned)
   Patient ID: Kathy Becker, female    DOB: 07/08/1935, 87 y.o.   MRN: [id number]  HPI  Kathy Becker is a 87 y/o female with a history of  Echo 06/23/2018: EF 40-45% along with elevated mean left atrial pressure  Was in the ED 06/28/22 due to acute on chronic heart failure. IV lasix given w/ improvement of symptoms.   She presents today for her initial visit with a chief complaint  Review of Systems    Physical Exam  Assessment & Plan:  1: Chronic heart failure with reduced ejection fraction- - NYHA class - echo 06/23/2018: EF 40-45% along with elevated mean left atrial pressure - updated echo scheduled for  - BNP 06/28/22 was 762.8  2: HTN w/ CKD- - BP - saw PCP Sarajane Jews) 07/04/22 - BMP 06/28/22 showed sodium 141, potassium 3.6, creatinine 1.12 and GFR 48  3: CAD-  4: Carotid stenosis- - right ICA stenosis measuring 40 to 59%, left without stenosis.  - saw vascular (Rhyne) 06/26/22

## 2022-07-15 ENCOUNTER — Ambulatory Visit: Payer: Medicare Other | Attending: Family | Admitting: Family

## 2022-07-15 ENCOUNTER — Encounter: Payer: Self-pay | Admitting: Family

## 2022-07-15 VITALS — BP 138/88 | HR 85 | Wt 143.2 lb

## 2022-07-15 DIAGNOSIS — I714 Abdominal aortic aneurysm, without rupture, unspecified: Secondary | ICD-10-CM | POA: Insufficient documentation

## 2022-07-15 DIAGNOSIS — I6521 Occlusion and stenosis of right carotid artery: Secondary | ICD-10-CM | POA: Diagnosis not present

## 2022-07-15 DIAGNOSIS — I130 Hypertensive heart and chronic kidney disease with heart failure and stage 1 through stage 4 chronic kidney disease, or unspecified chronic kidney disease: Secondary | ICD-10-CM | POA: Diagnosis not present

## 2022-07-15 DIAGNOSIS — I2510 Atherosclerotic heart disease of native coronary artery without angina pectoris: Secondary | ICD-10-CM | POA: Insufficient documentation

## 2022-07-15 DIAGNOSIS — Z79899 Other long term (current) drug therapy: Secondary | ICD-10-CM | POA: Insufficient documentation

## 2022-07-15 DIAGNOSIS — I5022 Chronic systolic (congestive) heart failure: Secondary | ICD-10-CM | POA: Insufficient documentation

## 2022-07-15 DIAGNOSIS — G8929 Other chronic pain: Secondary | ICD-10-CM | POA: Insufficient documentation

## 2022-07-15 DIAGNOSIS — I252 Old myocardial infarction: Secondary | ICD-10-CM | POA: Insufficient documentation

## 2022-07-15 DIAGNOSIS — M549 Dorsalgia, unspecified: Secondary | ICD-10-CM | POA: Insufficient documentation

## 2022-07-15 DIAGNOSIS — R609 Edema, unspecified: Secondary | ICD-10-CM | POA: Insufficient documentation

## 2022-07-15 DIAGNOSIS — R140 Abdominal distension (gaseous): Secondary | ICD-10-CM | POA: Insufficient documentation

## 2022-07-15 DIAGNOSIS — R0602 Shortness of breath: Secondary | ICD-10-CM | POA: Diagnosis not present

## 2022-07-15 DIAGNOSIS — N189 Chronic kidney disease, unspecified: Secondary | ICD-10-CM | POA: Insufficient documentation

## 2022-07-15 DIAGNOSIS — E785 Hyperlipidemia, unspecified: Secondary | ICD-10-CM | POA: Insufficient documentation

## 2022-07-15 DIAGNOSIS — I10 Essential (primary) hypertension: Secondary | ICD-10-CM

## 2022-07-15 MED ORDER — LISINOPRIL 40 MG PO TABS: 40.0000 mg | ORAL_TABLET | Freq: Every day | ORAL | 5 refills | Status: DC

## 2022-07-15 NOTE — Progress Notes (Signed)
REDS VEST READING= 26 CHEST RULER=27  VEST FITTING TASKS: POSTURE=sitting HEIGHT MARKER=A CENTER STRIP=aligned  COMMENTS:

## 2022-07-15 NOTE — Patient Instructions (Addendum)
Stop taking amlodipine.    Increase your lisinopril to 2 tablets daily until this bottle is gone. When you get the new bottle, it will be the 40mg  dose so you will then resume taking 1 tablet daily.    When you go get your echocardiogram done, you will go to the [facility] entrance

## 2022-07-23 ENCOUNTER — Ambulatory Visit: Payer: Self-pay

## 2022-07-23 NOTE — Patient Outreach (Signed)
  Care Coordination   Follow Up Visit Note   07/23/2022 Name: Kathy Becker MRN: [id number] DOB: 1936/01/09  Kathy Becker is a 87 y.o. year old female who sees Kathy Morale, MD for primary care. I spoke with  Kathy Becker by phone today.  What matters to the patients health and wellness today?  none    Goals Addressed             This Visit's Progress    Heart Failure Management       Patient Goals/Self Care Activities: -Patient/Caregiver will self-administer medications as prescribed as evidenced by self-report/primary caregiver report  -Patient/Caregiver will attend all scheduled provider appointments as evidenced by clinician review of documented attendance to scheduled appointments and patient/caregiver report -Patient/Caregiver will call provider office for new concerns or questions as evidenced by review of documented incoming telephone call notes and patient report  -Weigh daily and record (notify MD with 3 lb weight gain over night or 5 lb in a week) -Adhere to low sodium diet  Patient is weighing but unable to give weight.  Discussed importance of weight for heart failure maintenance.  Denies swelling or shortness of breath.   Interventions Today    Flowsheet Row Most Recent Value  Chronic Disease   Chronic disease during today's visit Congestive Heart Failure (CHF)  General Interventions   General Interventions Discussed/Reviewed General Interventions Reviewed  Doctor Visits Discussed/Reviewed Doctor Visits Reviewed  Exercise Interventions   Exercise Discussed/Reviewed Weight Managment, Exercise Discussed  Weight Management Weight maintenance  Education Interventions   Education Provided Provided Education  [Heart Failure and weights]  Provided Verbal Education On Medication, Nutrition  Nutrition Interventions   Nutrition Discussed/Reviewed Decreasing salt, Nutrition Reviewed              SDOH assessments and interventions completed:  Yes  SDOH  Interventions Today    Flowsheet Row Most Recent Value  SDOH Interventions   Housing Interventions Intervention Not Indicated        Care Coordination Interventions:  Yes, provided   Follow up plan: Follow up call scheduled for April    Encounter Outcome:  Pt. Visit Completed   Kathy Baseman, RN, MSN [facility] [facility] Care Management Coordinator Direct Line [phone]

## 2022-07-23 NOTE — Patient Instructions (Signed)
Visit Information  Thank you for taking time to visit with me today. Please don't hesitate to contact me if I can be of assistance to you.   Following are the goals we discussed today:   Goals Addressed             This Visit's Progress    Heart Failure Management       Patient Goals/Self Care Activities: -Patient/Caregiver will self-administer medications as prescribed as evidenced by self-report/primary caregiver report  -Patient/Caregiver will attend all scheduled provider appointments as evidenced by clinician review of documented attendance to scheduled appointments and patient/caregiver report -Patient/Caregiver will call provider office for new concerns or questions as evidenced by review of documented incoming telephone call notes and patient report  -Weigh daily and record (notify MD with 3 lb weight gain over night or 5 lb in a week) -Adhere to low sodium diet  Patient is weighing but unable to give weight.  Discussed importance of weight for heart failure maintenance.  Denies swelling or shortness of breath.   Interventions Today    Flowsheet Row Most Recent Value  Chronic Disease   Chronic disease during today's visit Congestive Heart Failure (CHF)  General Interventions   General Interventions Discussed/Reviewed General Interventions Reviewed  Doctor Visits Discussed/Reviewed Doctor Visits Reviewed  Exercise Interventions   Exercise Discussed/Reviewed Weight Managment, Exercise Discussed  Weight Management Weight maintenance  Education Interventions   Education Provided Provided Education  [Heart Failure and weights]  Provided Verbal Education On Medication, Nutrition  Nutrition Interventions   Nutrition Discussed/Reviewed Decreasing salt, Nutrition Reviewed              Our next appointment is by telephone on 08/27/22 at 1030  Please call the care guide team at [phone] if you need to cancel or reschedule your appointment.   If you are experiencing a  Mental Health or [place] or need someone to talk to, please call the Suicide and Crisis Lifeline: 988   Patient verbalizes understanding of instructions and care plan provided today and agrees to view in [place]. Active MyChart status and patient understanding of how to access instructions and care plan via MyChart confirmed with patient.     The patient has been provided with contact information for the care management team and has been advised to call with any health related questions or concerns.   Jone Baseman, RN, MSN [facility] Care Management Coordinator Direct Line [phone]

## 2022-08-08 ENCOUNTER — Ambulatory Visit
Admission: RE | Admit: 2022-08-08 | Discharge: 2022-08-08 | Disposition: A | Discharge status: Home / Self Care | Payer: Medicare Other | Source: Ambulatory Visit | Attending: Family | Admitting: Family

## 2022-08-08 DIAGNOSIS — E785 Hyperlipidemia, unspecified: Secondary | ICD-10-CM | POA: Insufficient documentation

## 2022-08-08 DIAGNOSIS — I340 Nonrheumatic mitral (valve) insufficiency: Secondary | ICD-10-CM | POA: Diagnosis not present

## 2022-08-08 DIAGNOSIS — I5022 Chronic systolic (congestive) heart failure: Secondary | ICD-10-CM

## 2022-08-08 DIAGNOSIS — I110 Hypertensive heart disease with heart failure: Secondary | ICD-10-CM | POA: Insufficient documentation

## 2022-08-08 DIAGNOSIS — I252 Old myocardial infarction: Secondary | ICD-10-CM | POA: Insufficient documentation

## 2022-08-08 LAB — ECHOCARDIOGRAM COMPLETE
AR max vel: 1.81 cm2
AV Area VTI: 1.91 cm2
AV Area mean vel: 1.42 cm2
AV Mean grad: 5 mmHg
AV Peak grad: 10 mmHg
Ao pk vel: 1.58 m/s
Calc EF: 27.9 %
MV M vel: 5.75 m/s
MV Peak grad: 132.3 mmHg
MV VTI: 1.92 cm2
Radius: 0.65 cm
S' Lateral: 4 cm
Single Plane A2C EF: 29.9 %
Single Plane A4C EF: 28.5 %

## 2022-08-08 NOTE — Progress Notes (Signed)
*  PRELIMINARY RESULTS* Echocardiogram 2D Echocardiogram has been performed.  Cristela Blue 08/08/2022, 10:23 AM

## 2022-08-13 ENCOUNTER — Ambulatory Visit: Payer: Medicare Other | Attending: Family | Admitting: Family

## 2022-08-13 ENCOUNTER — Other Ambulatory Visit (HOSPITAL_COMMUNITY): Payer: Self-pay

## 2022-08-13 ENCOUNTER — Encounter: Payer: Self-pay | Admitting: Family

## 2022-08-13 VITALS — BP 122/80 | HR 82 | Resp 14 | Wt 143.1 lb

## 2022-08-13 DIAGNOSIS — I428 Other cardiomyopathies: Secondary | ICD-10-CM | POA: Diagnosis not present

## 2022-08-13 DIAGNOSIS — Z79899 Other long term (current) drug therapy: Secondary | ICD-10-CM | POA: Insufficient documentation

## 2022-08-13 DIAGNOSIS — I6521 Occlusion and stenosis of right carotid artery: Secondary | ICD-10-CM | POA: Insufficient documentation

## 2022-08-13 DIAGNOSIS — Z8249 Family history of ischemic heart disease and other diseases of the circulatory system: Secondary | ICD-10-CM | POA: Diagnosis not present

## 2022-08-13 DIAGNOSIS — I252 Old myocardial infarction: Secondary | ICD-10-CM | POA: Diagnosis not present

## 2022-08-13 DIAGNOSIS — N189 Chronic kidney disease, unspecified: Secondary | ICD-10-CM | POA: Insufficient documentation

## 2022-08-13 DIAGNOSIS — I5022 Chronic systolic (congestive) heart failure: Secondary | ICD-10-CM | POA: Diagnosis not present

## 2022-08-13 DIAGNOSIS — Z87891 Personal history of nicotine dependence: Secondary | ICD-10-CM | POA: Insufficient documentation

## 2022-08-13 DIAGNOSIS — G8929 Other chronic pain: Secondary | ICD-10-CM | POA: Insufficient documentation

## 2022-08-13 DIAGNOSIS — I10 Essential (primary) hypertension: Secondary | ICD-10-CM | POA: Diagnosis not present

## 2022-08-13 DIAGNOSIS — I130 Hypertensive heart and chronic kidney disease with heart failure and stage 1 through stage 4 chronic kidney disease, or unspecified chronic kidney disease: Secondary | ICD-10-CM | POA: Diagnosis not present

## 2022-08-13 DIAGNOSIS — I509 Heart failure, unspecified: Secondary | ICD-10-CM | POA: Insufficient documentation

## 2022-08-13 DIAGNOSIS — I714 Abdominal aortic aneurysm, without rupture, unspecified: Secondary | ICD-10-CM | POA: Insufficient documentation

## 2022-08-13 DIAGNOSIS — M549 Dorsalgia, unspecified: Secondary | ICD-10-CM | POA: Insufficient documentation

## 2022-08-13 DIAGNOSIS — E785 Hyperlipidemia, unspecified: Secondary | ICD-10-CM | POA: Insufficient documentation

## 2022-08-13 DIAGNOSIS — I2510 Atherosclerotic heart disease of native coronary artery without angina pectoris: Secondary | ICD-10-CM | POA: Diagnosis not present

## 2022-08-13 MED ORDER — EMPAGLIFLOZIN 10 MG PO TABS: 10.0000 mg | ORAL_TABLET | Freq: Every day | ORAL | 5 refills | Status: DC

## 2022-08-13 NOTE — Progress Notes (Signed)
Patient ID: Kathy Becker, female    DOB: Nov 25, 1935, 87 y.o.   MRN: [id number]  Primary cardiologist: None now, last seen "years ago" PCP: Kathy Salisbury, MD (last seen 03/24)  HPI  Kathy Becker is Becker 87 y/o female with Becker history of  CAD (MI), hyperlipidemia, HTN, CKD, tobacco use and chronic heart failure.   Echo 08/08/22: EF 25-30% along with mild LAE and moderate/ severe MR. Echo 06/23/2018: EF 40-45% along with elevated mean left atrial pressure  Was in the ED 06/28/22 due to acute on chronic heart failure. IV lasix given w/ improvement of symptoms.   She presents today for Becker f/u visit with Becker chief complaint of fatigue with moderate exertion. Chronic in nature. Has SOB, minimal pedal edema, chronic back pain and light-headedness along with this. Denies difficulty sleeping, abdominal distention, palpitations, chest pain, wheezing, cough or weight gain.   Amlodipine was stopped and lisinopril increased at her last visit & swelling has improved. Echo was done last week and she's here to review that.   Past Medical History:  Diagnosis Date   CAD (coronary artery disease)    stress test 08-15-08 showing lateral scar but no reversible ischemia    CHF (congestive heart failure) (HCC)    Hyperlipidemia    Hypertension    Myocardial infarction [facility])    Renal insufficiency    Past Surgical History:  Procedure Laterality Date   BACK SURGERY  1988   lumbar spine    CATARACT EXTRACTION, BILATERAL     per Dr. Dione Booze    COLONOSCOPY  never   she refuses to ever get one    DILATION AND CURETTAGE OF UTERUS     ENDARTERECTOMY Left 07/20/2018   Procedure: ENDARTERECTOMY CAROTID LEFT;  Surgeon: Sherren Kerns, MD;  Location: [facility] OR;  Service: Vascular;  Laterality: Left;   PATCH ANGIOPLASTY Left 07/20/2018   Procedure: PATCH ANGIOPLASTY WITH HEMASHIELD PLATINUM FINESSE CARDIOVASCULAR PATCH;  Surgeon: Sherren Kerns, MD;  Location: [facility] OR;  Service: Vascular;  Laterality: Left;   TONSILLECTOMY      Family History  Problem Relation Age of Onset   Coronary artery disease Other    Social History   Tobacco Use   Smoking status: Former    Passive exposure: Never   Smokeless tobacco: Never  Substance Use Topics   Alcohol use: No    Alcohol/week: 0.0 standard drinks of alcohol   Allergies  Allergen Reactions   Sulfonamide Derivatives     Confusion   Prior to Admission medications   Medication Sig Start Date End Date Taking? Authorizing Provider  aspirin EC 81 MG tablet Take 81 mg by mouth at bedtime.   Yes [provider]  doxycycline (PERIOSTAT) 20 MG tablet Take by mouth. 08/18/19  Yes [provider]  furosemide (LASIX) 20 MG tablet Take 1 tablet (20 mg total) by mouth daily. 07/04/22  Yes Kathy Salisbury, MD  lisinopril (ZESTRIL) 40 MG tablet Take 1 tablet (40 mg total) by mouth daily. 07/15/22  Yes Kathy Kindred A, FNP  omeprazole (PRILOSEC) 40 MG capsule TAKE 1 CAPSULE(40 MG) BY MOUTH DAILY 07/04/22  Yes Kathy Salisbury, MD  pravastatin (PRAVACHOL) 40 MG tablet Take 1 tablet (40 mg total) by mouth daily. 07/04/22  Yes Kathy Salisbury, MD   Review of Systems  Constitutional:  Positive for fatigue. Negative for appetite change.  HENT:  Negative for congestion, postnasal drip and sore throat.   Eyes: Negative.  Respiratory:  Positive for shortness of breath. Negative for cough, chest tightness and wheezing.   Cardiovascular:  Positive for leg swelling. Negative for chest pain and palpitations.  Gastrointestinal:  Negative for abdominal distention and abdominal pain.  Endocrine: Negative.   Genitourinary: Negative.   Musculoskeletal:  Positive for back pain (chronic). Negative for neck pain.  Skin: Negative.   Neurological:  Positive for light-headedness. Negative for dizziness.  Hematological:  Negative for adenopathy. Does not bruise/bleed easily.  Psychiatric/Behavioral:  Negative for dysphoric mood and sleep disturbance (sleeping on 1 pillow). The patient is  not nervous/anxious.    Vitals:   08/13/22 1058 08/13/22 1120  BP: (!) 157/80 122/80  Pulse: 82   Resp: 14   SpO2: 99%   Weight: 143 lb 2 oz (64.9 kg)    Wt Readings from Last 3 Encounters:  08/13/22 143 lb 2 oz (64.9 kg)  07/15/22 143 lb 3.2 oz (65 kg)  07/04/22 142 lb 3.2 oz (64.5 kg)   Lab Results  Component Value Date   CREATININE 1.12 (H) 06/28/2022   CREATININE 1.29 (H) 07/02/2021   CREATININE 1.37 (H) 06/29/2020   Physical Exam Vitals and nursing note reviewed.  Constitutional:      Appearance: Normal appearance.  HENT:     Head: Normocephalic and atraumatic.  Cardiovascular:     Rate and Rhythm: Normal rate and regular rhythm.  Pulmonary:     Effort: Pulmonary effort is normal. No respiratory distress.     Breath sounds: No wheezing or rales.  Abdominal:     General: There is no distension.     Palpations: Abdomen is soft.     Tenderness: There is no abdominal tenderness.  Musculoskeletal:        General: No tenderness.     Cervical back: Normal range of motion and neck supple.     Right lower leg: Edema (trace pitting) present.     Left lower leg: No edema.  Skin:    General: Skin is warm and dry.  Neurological:     General: No focal deficit present.     Mental Status: She is alert and oriented to person, place, and time.  Psychiatric:        Mood and Affect: Mood normal.        Behavior: Behavior normal.        Thought Content: Thought content normal.   Assessment & Plan:  1: NICM with reduced ejection fraction- - NYHA class II - euvolemic - weighing daily; reminded to call for overnight weight gain of > 2 pounds or Becker weekly weight gain of > 5 pounds - weight unchanged from last visit here 1 month ago - Echo 08/08/22: EF 25-30% along with mild LAE and moderate/ severe MR. Echo 06/23/2018: EF 40-45% along with elevated mean left atrial pressure - adds salt "on occasion" to foods like potatoes - continue furosemide  daily - continue lisinopril   daily - add jardiance  daily; 30 day voucher provided - may be able to use furosemide PRN - BMP next visit - discussed other GDMT that is recommended; she does mention that she likes to take medicine just once daily because she's very active - walks to her mailbox and back daily which is 1/2 mile total along with working in her yard - discussed referral to cardiologist but she would like to wait on this for now; saw Becker cardiologist at [facility] on [address] ~ 2010 - BNP 06/28/22 was 762.8  2: HTN w/ CKD- - BP initially elevated (157/80) and rechecked with manual cuff was 122/80  - saw PCP Clent Ridges) 03/24 - BMP 06/28/22 showed sodium 141, potassium 3.6, creatinine 1.12 and GFR 48  3: Carotid stenosis- - right ICA stenosis measuring 40 to 59%, left without stenosis.  - saw vascular ([facility]ne) 06/26/22 - AAA 3.2 cm   Return in 3 weeks, sooner if needed.

## 2022-08-13 NOTE — Patient Instructions (Signed)
Start taking jardiance as 1 tablet every morning.  

## 2022-08-19 ENCOUNTER — Telehealth: Payer: Self-pay

## 2022-08-19 MED ORDER — DAPAGLIFLOZIN PROPANEDIOL 10 MG PO TABS: 10.0000 mg | ORAL_TABLET | Freq: Every day | ORAL | 1 refills | Status: DC

## 2022-08-19 NOTE — Telephone Encounter (Signed)
Pt called stating she cannot tolerate the jardiance medication she was prescribed at her recent visit here, and would like to know what she can take instead. She states "I think it's just too strong. It took it out of me where I could hardly walk or move". She states she stopped the medication two days ago, but since then she has developed significant shortness of breath with minimal exertion. "I'm really short of breath after taking a few steps, I'm so winded".  She denies other symptoms such as weight gain, swelling, and chest pain. She is still taking her lasix daily. Will route to provider for advice.

## 2022-08-19 NOTE — Telephone Encounter (Addendum)
Reviewed Pricilla Riffle advice with patient. Patient would like to try new medication, farxiga.  Pt states she will have her daughter stop by clinic this afternoon to pick up 30 day free voucher. Advised pt to call us if symptoms do not improve. Reviewed worsening symptoms that should prompt pt to seek emergency treatment, such as difficulty breathing and chest pain. Prescription sent per Clarisa Kindred, NP.  Pt verbalized understanding and had no further questions at this time.

## 2022-08-19 NOTE — Addendum Note (Signed)
Addended by: Simonne Maffucci on: 08/19/2022 10:16 AM   Modules accepted: Orders

## 2022-08-20 ENCOUNTER — Other Ambulatory Visit: Payer: Self-pay

## 2022-08-27 ENCOUNTER — Ambulatory Visit: Payer: Self-pay

## 2022-08-27 NOTE — Patient Instructions (Signed)
Visit Information  Thank you for taking time to visit with me today. Please don't hesitate to contact me if I can be of assistance to you.   Following are the goals we discussed today:   Goals Addressed             This Visit's Progress    Heart Failure Management       Patient Goals/Self Care Activities: -Patient/Caregiver will self-administer medications as prescribed as evidenced by self-report/primary caregiver report  -Patient/Caregiver will attend all scheduled provider appointments as evidenced by clinician review of documented attendance to scheduled appointments and patient/caregiver report -Patient/Caregiver will call provider office for new concerns or questions as evidenced by review of documented incoming telephone call notes and patient report  -Weigh daily and record (notify MD with 3 lb weight gain over night or 5 lb in a week) -Adhere to low sodium diet  Patient weight 139.2 lbs   Discussed importance of weight for heart failure maintenance.  Denies swelling or shortness of breath.          Our next appointment is by telephone on 09/24/22 at 1030  Please call the care guide team at [phone] if you need to cancel or reschedule your appointment.   If you are experiencing a Mental Health or Behavioral Health Crisis or need someone to talk to, please call the Suicide and Crisis Lifeline: 988   Patient verbalizes understanding of instructions and care plan provided today and agrees to view in MyChart. Active MyChart status and patient understanding of how to access instructions and care plan via MyChart confirmed with patient.     The patient has been provided with contact information for the care management team and has been advised to call with any health related questions or concerns.   Bary Leriche, RN, MSN [facility] Care Management Care Management Coordinator Direct Line [phone]

## 2022-08-27 NOTE — Patient Outreach (Signed)
  Care Coordination   Follow Up Visit Note   08/27/2022 Name: Kathy Becker MRN: [id number] DOB: 07/14/1935  Kathy Becker is a 87 y.o. year old female who sees Kathy Salisbury, MD for primary care. I spoke with  Kathy Becker by phone today.  What matters to the patients health and wellness today?  Heart failure management    Goals Addressed             This Visit's Progress    Heart Failure Management       Patient Goals/Self Care Activities: -Patient/Caregiver will self-administer medications as prescribed as evidenced by self-report/primary caregiver report  -Patient/Caregiver will attend all scheduled provider appointments as evidenced by clinician review of documented attendance to scheduled appointments and patient/caregiver report -Patient/Caregiver will call provider office for new concerns or questions as evidenced by review of documented incoming telephone call notes and patient report  -Weigh daily and record (notify MD with 3 lb weight gain over night or 5 lb in a week) -Adhere to low sodium diet  Patient weight 139.2 lbs   Discussed importance of weight for heart failure maintenance.  Denies swelling or shortness of breath.          SDOH assessments and interventions completed:  Yes     Care Coordination Interventions:  Yes, provided   Follow up plan: Follow up call scheduled for May    Encounter Outcome:  Pt. Visit Completed   Kathy Leriche, RN, MSN [facility] Care Management Care Management Coordinator Direct Line [phone]

## 2022-08-30 ENCOUNTER — Other Ambulatory Visit (HOSPITAL_COMMUNITY): Payer: Self-pay

## 2022-09-11 ENCOUNTER — Encounter: Payer: Self-pay | Admitting: Family

## 2022-09-11 ENCOUNTER — Ambulatory Visit (HOSPITAL_BASED_OUTPATIENT_CLINIC_OR_DEPARTMENT_OTHER): Payer: Medicare Other | Admitting: Family

## 2022-09-11 ENCOUNTER — Other Ambulatory Visit (HOSPITAL_COMMUNITY): Payer: Self-pay

## 2022-09-11 ENCOUNTER — Other Ambulatory Visit
Admission: RE | Admit: 2022-09-11 | Discharge: 2022-09-11 | Disposition: A | Discharge status: Home / Self Care | Payer: Medicare Other | Source: Ambulatory Visit | Attending: Family | Admitting: Family

## 2022-09-11 VITALS — BP 160/90 | HR 85 | Wt 144.8 lb

## 2022-09-11 DIAGNOSIS — I6521 Occlusion and stenosis of right carotid artery: Secondary | ICD-10-CM | POA: Diagnosis not present

## 2022-09-11 DIAGNOSIS — I5022 Chronic systolic (congestive) heart failure: Secondary | ICD-10-CM | POA: Insufficient documentation

## 2022-09-11 DIAGNOSIS — I10 Essential (primary) hypertension: Secondary | ICD-10-CM

## 2022-09-11 LAB — BASIC METABOLIC PANEL
Anion gap: 8 (ref 5–15)
BUN: 14 mg/dL (ref 8–23)
CO2: 22 mmol/L (ref 22–32)
Calcium: 8.9 mg/dL (ref 8.9–10.3)
Chloride: 110 mmol/L (ref 98–111)
Creatinine, Ser: 1.27 mg/dL — ABNORMAL HIGH (ref 0.44–1.00)
GFR, Estimated: 41 mL/min — ABNORMAL LOW (ref >60)
Glucose, Bld: 124 mg/dL — ABNORMAL HIGH (ref 70–99)
Potassium: 4.2 mmol/L (ref 3.5–5.1)
Sodium: 140 mmol/L (ref 135–145)

## 2022-09-11 MED ORDER — VALSARTAN 80 MG PO TABS: 80.0000 mg | ORAL_TABLET | Freq: Every day | ORAL | 5 refills | Status: DC

## 2022-09-11 NOTE — Patient Instructions (Signed)
Resume taking your lasix.   Stop taking your lisinopril as we are changing this to valsartan. You will take the valsartan once daily as well.    The patient advocate will be calling you later this week to get information from you to see if you qualify for any assistance with farxiga.

## 2022-09-11 NOTE — Progress Notes (Signed)
PCP: Gershon Crane, MD (last seen 03/24) Primary Cardiologist: None  HPI:  Kathy Becker is a 87 y/o female with a history of  CAD (MI), hyperlipidemia, HTN, CKD, tobacco use and chronic heart failure.   Echo 08/08/22: EF 25-30% along with mild LAE and moderate/ severe MR. Echo 06/23/2018: EF 40-45% along with elevated mean left atrial pressure  Was in the ED 06/28/22 due to acute on chronic heart failure. IV lasix given w/ improvement of symptoms.   She presents today for a HF f/u visit with a chief complaint of minimal SOB with moderate exertion. Chronic in nature. Has associated pedal edema, palpitations, intermittent dizziness and fatigue along with this. Is a little agitated initially because she said that she got lost, again, coming to the appointment.   Since last visit here, she had tried jardiance and developed SOB. Stopped it and SOB resolved. Was willing to try farxiga and has tolerated farxiga without any issues. She had stopped her furosemide when she started farxiga because she thought there was lasix in the farxiga. Has noticed a slight increase in pedal edema since this time.   Did eat at [employer] 6 days ago to celebrate an early Mother's Day. Ate lasagna and the salad and knew that she was eating more salt than she should. Says that she doesn't eat there very often.   ROS: All systems negative except as listed in HPI, PMH and Problem List.  SH:  Social History   Socioeconomic History   Marital status: Legally Separated    Spouse name: Not on file   Number of children: Not on file   Years of education: Not on file   Highest education level: Not on file  Occupational History   Not on file  Tobacco Use   Smoking status: Former    Passive exposure: Never   Smokeless tobacco: Never  Vaping Use   Vaping Use: Never used  Substance and Sexual Activity   Alcohol use: No    Alcohol/week: 0.0 standard drinks of alcohol   Drug use: No   Sexual activity: Not on file  Other  Topics Concern   Not on file  Social History Narrative   Not on file   Social Determinants of Health   Financial Resource Strain: Low Risk  (06/03/2022)   Overall Financial Resource Strain (CARDIA)    Difficulty of Paying Living Expenses: Not hard at all  Food Insecurity: No Food Insecurity (07/01/2022)   Hunger Vital Sign    Worried About Running Out of Food in the Last Year: Never true    Ran Out of Food in the Last Year: Never true  Transportation Needs: No Transportation Needs (07/01/2022)   PRAPARE - Administrator, Civil Service (Medical): No    Lack of Transportation (Non-Medical): No  Physical Activity: Inactive (06/03/2022)   Exercise Vital Sign    Days of Exercise per Week: 0 days    Minutes of Exercise per Session: 0 min  Stress: No Stress Concern Present (06/03/2022)   [employer] of Occupational Health - Occupational Stress Questionnaire    Feeling of Stress : Not at all  Social Connections: Moderately Integrated (06/03/2022)   Social Connection and Isolation Panel [NHANES]    Frequency of Communication with Friends and Family: More than three times a week    Frequency of Social Gatherings with Friends and Family: More than three times a week    Attends Religious Services: More than 4 times per year  Active Member of Clubs or Organizations: Yes    Attends Banker Meetings: More than 4 times per year    Marital Status: Divorced  Intimate Partner Violence: Not At Risk (06/03/2022)   Humiliation, Afraid, Rape, and Kick questionnaire    Fear of Current or Ex-Partner: No    Emotionally Abused: No    Physically Abused: No    Sexually Abused: No    FH:  Family History  Problem Relation Age of Onset   Coronary artery disease Other     Past Medical History:  Diagnosis Date   CAD (coronary artery disease)    stress test 08-15-08 showing lateral scar but no reversible ischemia    CHF (congestive heart failure) (HCC)    Hyperlipidemia     Hypertension    Myocardial infarction [facility])    Renal insufficiency     Current Outpatient Medications  Medication Sig Dispense Refill   aspirin EC 81 MG tablet Take 81 mg by mouth at bedtime.     dapagliflozin propanediol (FARXIGA) 10 MG TABS tablet Take 1 tablet (10 mg total) by mouth daily before breakfast. 30 tablet 1   doxycycline (PERIOSTAT) 20 MG tablet Take by mouth.     furosemide (LASIX) 20 MG tablet Take 1 tablet (20 mg total) by mouth daily. 30 tablet 11   lisinopril (ZESTRIL) 40 MG tablet Take 1 tablet (40 mg total) by mouth daily. 30 tablet 5   omeprazole (PRILOSEC) 40 MG capsule TAKE 1 CAPSULE(40 MG) BY MOUTH DAILY 30 capsule 11   pravastatin (PRAVACHOL) 40 MG tablet Take 1 tablet (40 mg total) by mouth daily. 30 tablet 11   No current facility-administered medications for this visit.   Vitals:   09/11/22 0845  BP: (!) 160/90  Pulse: 85  SpO2: 98%  Weight: 144 lb 12.8 oz (65.7 kg)   Wt Readings from Last 3 Encounters:  09/11/22 144 lb 12.8 oz (65.7 kg)  08/13/22 143 lb 2 oz (64.9 kg)  07/15/22 143 lb 3.2 oz (65 kg)   Lab Results  Component Value Date   CREATININE 1.12 (H) 06/28/2022   CREATININE 1.29 (H) 07/02/2021   CREATININE 1.37 (H) 06/29/2020   ReDs: 31%   PHYSICAL EXAM:  General:  Well appearing. No resp difficulty HEENT: normal Neck: supple. JVP flat. No lymphadenopathy or thryomegaly appreciated. Cor: PMI normal. Regular rate & rhythm. No rubs, gallops or murmurs. Lungs: clear Abdomen: soft, nontender, nondistended. No hepatosplenomegaly. No bruits or masses.  Extremities: no cyanosis, clubbing, rash. Trace pitting edema bilateral lower legs Neuro: alert & orientedx3, cranial nerves grossly intact. Moves all 4 extremities w/o difficulty. Affect pleasant.   Reds: 31%   ASSESSMENT & PLAN:  1: NICM with reduced ejection fraction- - NYHA class II - euvolemic - etiology likely HTN - weighing daily; reminded to call for overnight weight gain  of > 2 pounds or a weekly weight gain of > 5 pounds - weight up 1 pound from last visit here 1 month ago - Reds clip reading today was 31% - Echo 08/08/22: EF 25-30% along with mild LAE and moderate/ severe MR.  - Echo 06/23/2018: EF 40-45% along with elevated mean left atrial pressure - adds salt "on occasion" to foods like potatoes; did recently eat at [employer] - resume furosemide 20mg  daily - stop lisinopril and begin valsartan 80mg  daily (she admits that taking BID entresto would be difficult and she would be inconsistent with the BID dosing) - continue farxiga 10mg  daily -  BMP today - discussed other GDMT that is recommended such as metoprolol succinate/ spironolactone; she is agreeable if everything is once daily - walks to her mailbox and back daily which is 1/2 mile total along with working in her yard - discussed referral to cardiologist but she would like to wait on this for now; saw a cardiologist at [facility] on [address] ~ 2010 - BNP 06/28/22 was 762.8 - PharmD reconciled meds w/ patient  2: HTN w/ CKD- - BP 164/148; rechecked with manual cuff was 160/90 - saw PCP Clent Ridges) 03/24 - BMP 06/28/22 showed sodium 141, potassium 3.6, creatinine 1.12 and GFR 48  3: Carotid stenosis- - right ICA stenosis measuring 40 to 59%, left without stenosis.  - saw vascular ([facility]) 02/24 - AAA 3.2 cm  Return in 2-3 weeks, sooner if needed.

## 2022-09-11 NOTE — Progress Notes (Signed)
   09/11/22 0918  ReDS Vest / Clip  Station Marker A  Ruler Value 23  ReDS Value Range < 36  ReDS Actual Value 31

## 2022-09-11 NOTE — Progress Notes (Signed)
[facility] HEART FAILURE CLINIC - Pharmacist Note  Kathy Becker is a 87 y.o. female with HFrEF (EF <40%) presenting to the Heart Failure Clinic for follow up. Patient reports that she started [place] since her last visit. She reports that this is not affordable for her. Patient advocate has been notified of this issue and plans to reach out to the patient later this week. She also reports that she was told that there was furosemide in her new medication (dapagliflozin), so she stopped taking furosemide when she started dapagliflozin. She would like to clarify whether she needs daily furosemide at today's visit. She reports occasional shortness of breath and hand/foot swelling. She states that she is short of breath and that her feet are swollen today. She weighs herself daily and states that her weights are usually stable within 1 lb. She has no issues with appetite and reports that she drinks 1-2 bottles of soda and one cup of coffee daily. She prefers not to drink water. She reports no adverse events with her current regimen. She prefers once daily medications and would like to minimize pill burden.  Recent ED Visit (past 6 months):  Date: 06/28/2022, CC: SOB  Guideline-Directed Medical Therapy/Evidence Based Medicine ACE/ARB/ARNI: Lisinopril 40 Beta Blocker:  none Aldosterone Antagonist:  none Diuretic:  none SGLT2i: Dapagliflozin 10 mg daily  Adherence Assessment Do you ever forget to take your medication? [] Yes [x] No  Do you ever skip doses due to side effects? [] Yes [x] No  Do you have trouble affording your medicines? [] Yes [x] No  Are you ever unable to pick up your medication due to transportation difficulties? [] Yes [x] No  Do you ever stop taking your medications because you don't believe they are helping? [] Yes [x] No  Do you check your weight daily? [x] Yes [] No  Adherence strategy: pillbox  Barriers to obtaining medications: cost of dapagliflozin  Diagnostics ECHO: Date 08/08/2022,  EF 25-30%, [facility]  Vitals    09/11/2022    8:45 AM 08/13/2022   11:20 AM 08/13/2022   10:58 AM  Vitals with BMI  Weight 144 lbs 13 oz  143 lbs 2 oz  Systolic 160 122 161  Diastolic 90 80 80  Pulse 85  82     Recent Labs    Latest Ref Rng & Units 06/28/2022   10:26 AM 07/02/2021    9:43 AM 06/29/2020    8:34 AM  BMP  Glucose 70 - 99 mg/dL 096  045  409   BUN 8 - 23 mg/dL 14  18  24    Creatinine 0.44 - 1.00 mg/dL 8.11  9.14  7.82   Sodium 135 - 145 mmol/L 141  140  141   Potassium 3.5 - 5.1 mmol/L 3.6  4.1  4.4   Chloride 98 - 111 mmol/L 109  104  108   CO2 22 - 32 mmol/L 21  25  25    Calcium 8.9 - 10.3 mg/dL 8.8  9.5  9.2     Past Medical History Past Medical History:  Diagnosis Date   CAD (coronary artery disease)    stress test 08-15-08 showing lateral scar but no reversible ischemia    CHF (congestive heart failure) (HCC)    Hyperlipidemia    Hypertension    Myocardial infarction [facility])    Renal insufficiency    Plan ReDS today 31 Continue current regimen per NP Resume furosemide 20 mg daily Switch from lisinopril 40 mg daily to valsartan 80 mg daily Consider titration of valsartan or addition of  spironolactone at next visit Consider switch to atorvastatin 80 mg daily for LDL of 147 (07/04/2022) on pravastatin 40 mg daily Follow up with patient advocate for patient assistance  Time spent: 10 minutes  Kathy Becker, PharmD PGY1 Pharmacy Resident 09/11/2022 9:43 AM

## 2022-09-16 ENCOUNTER — Telehealth (HOSPITAL_COMMUNITY): Payer: Self-pay

## 2022-09-16 NOTE — Telephone Encounter (Signed)
Advanced Heart Failure Patient Advocate Encounter  The patient was approved for a grant with [employer] that will help cover the cost of Farxiga.  Effective: 07/15/2022 - 04/29/2023.  BIN [id number] PCN [employer] [id number] ID [id number]  Patient provided with approval and processing information via mail. She is not due for a refill until next month, but she will call me if there are any concerns.  Burnell Blanks, CPhT Rx Patient Advocate Phone: [phone]

## 2022-09-24 ENCOUNTER — Ambulatory Visit: Payer: Self-pay

## 2022-09-24 NOTE — Patient Outreach (Signed)
  Care Coordination   Follow Up Visit Note   09/24/2022 Name: Kathy Becker MRN: [id number] DOB: 17-Jun-1935  Kathy Becker is a 87 y.o. year old female who sees Nelwyn Salisbury, MD for primary care. I spoke with  Kathy Becker by phone today.  What matters to the patients health and wellness today?  Heart failure management    Goals Addressed             This Visit's Progress    Heart Failure Management       Patient Goals/Self Care Activities: -Patient/Caregiver will self-administer medications as prescribed as evidenced by self-report/primary caregiver report  -Patient/Caregiver will attend all scheduled provider appointments as evidenced by clinician review of documented attendance to scheduled appointments and patient/caregiver report -Patient/Caregiver will call provider office for new concerns or questions as evidenced by review of documented incoming telephone call notes and patient report  -Weigh daily and record (notify MD with 3 lb weight gain over night or 5 lb in a week) -Adhere to low sodium diet  Patient weight 140 lbs   Discussed importance of weight for heart failure maintenance.  Denies swelling or shortness of breath.          SDOH assessments and interventions completed:  Yes     Care Coordination Interventions:  Yes, provided   Follow up plan: June   Encounter Outcome:  Pt. Visit Completed

## 2022-09-24 NOTE — Patient Instructions (Signed)
Visit Information  Thank you for taking time to visit with me today. Please don't hesitate to contact me if I can be of assistance to you.   Following are the goals we discussed today:   Goals Addressed             This Visit's Progress    Heart Failure Management       Patient Goals/Self Care Activities: -Patient/Caregiver will self-administer medications as prescribed as evidenced by self-report/primary caregiver report  -Patient/Caregiver will attend all scheduled provider appointments as evidenced by clinician review of documented attendance to scheduled appointments and patient/caregiver report -Patient/Caregiver will call provider office for new concerns or questions as evidenced by review of documented incoming telephone call notes and patient report  -Weigh daily and record (notify MD with 3 lb weight gain over night or 5 lb in a week) -Adhere to low sodium diet  Patient weight 140 lbs   Discussed importance of weight for heart failure maintenance.  Denies swelling or shortness of breath.          Our next appointment is by telephone on 10/15/22 at 1100  Please call the care guide team at [phone] if you need to cancel or reschedule your appointment.   If you are experiencing a Mental Health or Behavioral Health Crisis or need someone to talk to, please call the Suicide and Crisis Lifeline: 988   Patient verbalizes understanding of instructions and care plan provided today and agrees to view in MyChart. Active MyChart status and patient understanding of how to access instructions and care plan via MyChart confirmed with patient.     The patient has been provided with contact information for the care management team and has been advised to call with any health related questions or concerns.   Bary Leriche, RN, MSN [facility] Care Management Care Management Coordinator Direct Line [phone]

## 2022-10-01 NOTE — Progress Notes (Unsigned)
PCP: Gershon Crane, MD (last seen 03/24) Primary Cardiologist: None  HPI:  Kathy Becker is a 87 y/o female with a history of  CAD (MI), hyperlipidemia, HTN, CKD, tobacco use and chronic heart failure.   Echo 08/08/22: EF 25-30% along with mild LAE and moderate/ severe MR. Echo 06/23/2018: EF 40-45% along with elevated mean left atrial pressure  Was in the ED 06/28/22 due to acute on chronic heart failure. IV lasix given w/ improvement of symptoms.   She presents today for a HF f/u visit with a chief complaint of minimal SOB with moderate exertion. She does feel like her breathing has been improving since she was last here. She has associated intermittent palpitations and minimal fatigue (improving) along with this. Denies cough, chest pain, palpitations, pedal edema, abdominal distention, dizziness or difficulty sleeping  At last visit, valsartan was started & she doesn't notice any issues with taking it. Continues to tolerate farxiga without any SOB. She has not taken her furosemide yet today but plans to do so upon her return home.  ROS: All systems negative except as listed in HPI, PMH and Problem List.  SH:  Social History   Socioeconomic History   Marital status: Legally Separated    Spouse name: Not on file   Number of children: Not on file   Years of education: Not on file   Highest education level: Not on file  Occupational History   Not on file  Tobacco Use   Smoking status: Former    Passive exposure: Never   Smokeless tobacco: Never  Vaping Use   Vaping Use: Never used  Substance and Sexual Activity   Alcohol use: No    Alcohol/week: 0.0 standard drinks of alcohol   Drug use: No   Sexual activity: Not on file  Other Topics Concern   Not on file  Social History Narrative   Not on file   Social Determinants of Health   Financial Resource Strain: Low Risk  (06/03/2022)   Overall Financial Resource Strain (CARDIA)    Difficulty of Paying Living Expenses: Not hard at all   Food Insecurity: No Food Insecurity (07/01/2022)   Hunger Vital Sign    Worried About Running Out of Food in the Last Year: Never true    Ran Out of Food in the Last Year: Never true  Transportation Needs: No Transportation Needs (07/01/2022)   PRAPARE - Administrator, Civil Service (Medical): No    Lack of Transportation (Non-Medical): No  Physical Activity: Inactive (06/03/2022)   Exercise Vital Sign    Days of Exercise per Week: 0 days    Minutes of Exercise per Session: 0 min  Stress: No Stress Concern Present (06/03/2022)   [employer] of Occupational Health - Occupational Stress Questionnaire    Feeling of Stress : Not at all  Social Connections: Moderately Integrated (06/03/2022)   Social Connection and Isolation Panel [NHANES]    Frequency of Communication with Friends and Family: More than three times a week    Frequency of Social Gatherings with Friends and Family: More than three times a week    Attends Religious Services: More than 4 times per year    Active Member of [employer] or Organizations: Yes    Attends Banker Meetings: More than 4 times per year    Marital Status: Divorced  Intimate Partner Violence: Not At Risk (06/03/2022)   Humiliation, Afraid, Rape, and Kick questionnaire    Fear of Current or Ex-Partner:  No    Emotionally Abused: No    Physically Abused: No    Sexually Abused: No    FH:  Family History  Problem Relation Age of Onset   Coronary artery disease Other     Past Medical History:  Diagnosis Date   CAD (coronary artery disease)    stress test 08-15-08 showing lateral scar but no reversible ischemia    CHF (congestive heart failure) (HCC)    Hyperlipidemia    Hypertension    Myocardial infarction [facility])    Renal insufficiency     Current Outpatient Medications  Medication Sig Dispense Refill   aspirin EC 81 MG tablet Take 81 mg by mouth at bedtime.     Cranberry-Vitamin C-Probiotic (AZO CRANBERRY PO) Take 1 capsule by  mouth daily at 6 (six) AM.     dapagliflozin propanediol (FARXIGA) 10 MG TABS tablet Take 1 tablet (10 mg total) by mouth daily before breakfast. 30 tablet 1   doxycycline (PERIOSTAT) 20 MG tablet Take by mouth.     furosemide (LASIX) 20 MG tablet Take 1 tablet (20 mg total) by mouth daily. (Patient not taking: Reported on 09/11/2022) 30 tablet 11   omeprazole (PRILOSEC) 40 MG capsule TAKE 1 CAPSULE(40 MG) BY MOUTH DAILY 30 capsule 11   pravastatin (PRAVACHOL) 40 MG tablet Take 1 tablet (40 mg total) by mouth daily. 30 tablet 11   valsartan (DIOVAN) 80 MG tablet Take 1 tablet (80 mg total) by mouth daily. 30 tablet 5   No current facility-administered medications for this visit.   Vitals:   10/02/22 1024  BP: (!) 166/89  Pulse: 80  SpO2: 97%  Weight: 144 lb (65.3 kg)   Wt Readings from Last 3 Encounters:  10/02/22 144 lb (65.3 kg)  09/11/22 144 lb 12.8 oz (65.7 kg)  08/13/22 143 lb 2 oz (64.9 kg)   Lab Results  Component Value Date   CREATININE 1.27 (H) 09/11/2022   CREATININE 1.12 (H) 06/28/2022   CREATININE 1.29 (H) 07/02/2021   EKG: not done  PHYSICAL EXAM:  General:  Well appearing. No resp difficulty HEENT: normal Neck: supple. JVP flat. No lymphadenopathy or thryomegaly appreciated. Cor: PMI normal. Regular rate & rhythm. No rubs, gallops or murmurs. Lungs: clear Abdomen: soft, nontender, nondistended. No hepatosplenomegaly. No bruits or masses.  Extremities: no cyanosis, clubbing, rash. Trace pitting edema bilateral lower legs Neuro: alert & oriented x3, cranial nerves grossly intact. Moves all 4 extremities w/o difficulty. Affect pleasant.   ASSESSMENT & PLAN:  1: NICM with reduced ejection fraction- - etiology likely HTN - NYHA class II - euvolemic - weighing daily; reminded to call for overnight weight gain of > 2 pounds or a weekly weight gain of > 5 pounds - weight unchanged from last visit here 3 weeks ago - Echo 08/08/22: EF 25-30% along with mild LAE and  moderate/ severe MR.  - Echo 06/23/2018: EF 40-45% along with elevated mean left atrial pressure - adds salt "on occasion" to foods like potatoes; also likes to eat at [employer] and eat a sausage biscuit; is aware of high sodium content but plans to continue eating there "often" - continue furosemide 20mg  daily - increase valsartan to 160mg  daily (she says that she would be inconsistent with BID dosing of anything) - continue farxiga 10mg  daily - BMP today & then again next visit - discussed other GDMT that is recommended such as metoprolol succinate/ spironolactone; she is agreeable if everything is once daily - walks to her  mailbox and back daily which is 1/2 mile total along with working in her yard - discussed referral to cardiologist but she would like to wait on this for now; saw a cardiologist at [facility] on [address] ~ 2010 - BNP 06/28/22 was 762.8 - PharmD reconciled meds w/ patient  2: HTN w/ CKD- - BP 166/89; increasing valsartan per above - saw PCP Kathy Becker) 03/24 - BMP 09/11/22 showed sodium 140, potassium 4.2, creatinine 1.27 and GFR 47  3: Carotid stenosis- - right ICA stenosis measuring 40 to 59%, left without stenosis.  - saw vascular ([facility]) 02/24 - AAA 3.2 cm  Return in 1 month, sooner if needed.

## 2022-10-02 ENCOUNTER — Encounter: Payer: Self-pay | Admitting: Family

## 2022-10-02 ENCOUNTER — Encounter: Payer: Self-pay | Admitting: Pharmacist

## 2022-10-02 ENCOUNTER — Other Ambulatory Visit
Admission: RE | Admit: 2022-10-02 | Discharge: 2022-10-02 | Disposition: A | Discharge status: Home / Self Care | Payer: Medicare Other | Source: Ambulatory Visit | Attending: Family | Admitting: Family

## 2022-10-02 ENCOUNTER — Ambulatory Visit (HOSPITAL_BASED_OUTPATIENT_CLINIC_OR_DEPARTMENT_OTHER): Payer: Medicare Other | Admitting: Family

## 2022-10-02 VITALS — BP 166/89 | HR 80 | Wt 144.0 lb

## 2022-10-02 DIAGNOSIS — I5022 Chronic systolic (congestive) heart failure: Secondary | ICD-10-CM | POA: Diagnosis not present

## 2022-10-02 DIAGNOSIS — I10 Essential (primary) hypertension: Secondary | ICD-10-CM | POA: Diagnosis not present

## 2022-10-02 DIAGNOSIS — I6521 Occlusion and stenosis of right carotid artery: Secondary | ICD-10-CM | POA: Diagnosis not present

## 2022-10-02 LAB — BASIC METABOLIC PANEL
Anion gap: 9 (ref 5–15)
BUN: 15 mg/dL (ref 8–23)
CO2: 21 mmol/L — ABNORMAL LOW (ref 22–32)
Calcium: 8.5 mg/dL — ABNORMAL LOW (ref 8.9–10.3)
Chloride: 109 mmol/L (ref 98–111)
Creatinine, Ser: 1.32 mg/dL — ABNORMAL HIGH (ref 0.44–1.00)
GFR, Estimated: 40 mL/min — ABNORMAL LOW (ref >60)
Glucose, Bld: 119 mg/dL — ABNORMAL HIGH (ref 70–99)
Potassium: 4.3 mmol/L (ref 3.5–5.1)
Sodium: 139 mmol/L (ref 135–145)

## 2022-10-02 MED ORDER — VALSARTAN 160 MG PO TABS: 160.0000 mg | ORAL_TABLET | Freq: Every day | ORAL | 5 refills | Status: DC

## 2022-10-02 MED ORDER — DAPAGLIFLOZIN PROPANEDIOL 10 MG PO TABS: 10.0000 mg | ORAL_TABLET | Freq: Every day | ORAL | 6 refills | Status: DC

## 2022-10-02 NOTE — Patient Instructions (Addendum)
I am increasing your valsartan to 160mg  once daily. You can start this in your next pill box.    [facility] to get your lab work drawn

## 2022-10-08 NOTE — Progress Notes (Signed)
Patient ID: Kathy Becker, female   DOB: [birth year], 87 y.o.   MRN: [id number] [facility] - PHARMACIST COUNSELING NOTE  Guideline-Directed Medical Therapy/Evidence Based Medicine  ACE/ARB/ARNI: Valsartan 80 mg daily Beta Blocker:  none Aldosterone Antagonist:  none Diuretic: Furosemide 20 mg daily SGLT2i: Dapagliflozin 10 mg daily  Adherence Assessment  Do you ever forget to take your medication? [] Yes [x] No  Do you ever skip doses due to side effects? [] Yes [x] No  Do you have trouble affording your medicines? [] Yes [x] No  Are you ever unable to pick up your medication due to transportation difficulties? [] Yes [x] No  Do you ever stop taking your medications because you don't believe they are helping? [] Yes [x] No  Do you check your weight daily? [x] Yes [] No   Adherence strategy: pill box  Barriers to obtaining medications: none  Vital signs: HR 80, BP 166/89, weight (pounds) 144 lb  ECHO: Date 08/08/2022, EF 25-30% along with mild LAE and moderate/ severe MR. Echo 06/23/2018: EF 40-45% along with elevated mean left atrial pressure       Latest Ref Rng & Units 10/02/2022   11:36 AM 09/11/2022   10:20 AM 06/28/2022   10:26 AM  BMP  Glucose 70 - 99 mg/dL 409  811  914   BUN 8 - 23 mg/dL 15  14  14    Creatinine 0.44 - 1.00 mg/dL 7.82  9.56  2.13   Sodium 135 - 145 mmol/L 139  140  141   Potassium 3.5 - 5.1 mmol/L 4.3  4.2  3.6   Chloride 98 - 111 mmol/L 109  110  109   CO2 22 - 32 mmol/L 21  22  21    Calcium 8.9 - 10.3 mg/dL 8.5  8.9  8.8     Past Medical History:  Diagnosis Date   CAD (coronary artery disease)    stress test 08-15-08 showing lateral scar but no reversible ischemia    CHF (congestive heart failure) (HCC)    Hyperlipidemia    Hypertension    Myocardial infarction [facility])    Renal insufficiency     ASSESSMENT 87 year old female who presents to the HF clinic for follow up. PMH includes CAD (MI), hyperlipidemia,  HTN, CKD, tobacco use and chronic heart failure. Lat ED admission was 06/28/2022 for HF exacerbation and pleural effusion. Like medication with once daily dosing to keep regimen as simple as possible.Patient presets today in good spirit. Haven't noticed significant change in breathing. During last OV lisinopril was changed to valsartan. Patient denies issues with current medication regimen. Kennedy Bucker to cover Marcelline Deist copay was approved on 09/16/2022.  PLAN  Increase valsartan to 160 mg today Add spironolactone 25 mg po daily and metoprolol succinate 12.5 mg po daily during f/u appointment if BP, Scr and K allows Change pravastatin to atorvastatin or rosuvastatin during f/u visit  Time spent: 15 minutes  Kathy Becker PharmD, BCPS 10/08/2022 1:40 PM   Current Outpatient Medications:    aspirin EC 81 MG tablet, Take 81 mg by mouth at bedtime., Disp: , Rfl:    Cranberry-Vitamin C-Probiotic (AZO CRANBERRY PO), Take 1 capsule by mouth daily at 6 (six) AM., Disp: , Rfl:    dapagliflozin propanediol (FARXIGA) 10 MG TABS tablet, Take 1 tablet (10 mg total) by mouth daily before breakfast., Disp: 30 tablet, Rfl: 6   doxycycline (PERIOSTAT) 20 MG tablet, Take by mouth., Disp: , Rfl:    furosemide (LASIX) 20 MG tablet, Take 1  tablet (20 mg total) by mouth daily., Disp: 30 tablet, Rfl: 11   omeprazole (PRILOSEC) 40 MG capsule, TAKE 1 CAPSULE(40 MG) BY MOUTH DAILY, Disp: 30 capsule, Rfl: 11   pravastatin (PRAVACHOL) 40 MG tablet, Take 1 tablet (40 mg total) by mouth daily., Disp: 30 tablet, Rfl: 11   valsartan (DIOVAN) 160 MG tablet, Take 1 tablet (160 mg total) by mouth daily., Disp: 30 tablet, Rfl: 5   MEDICATION ADHERENCES TIPS AND STRATEGIES Taking medication as prescribed improves patient outcomes in heart failure (reduces hospitalizations, improves symptoms, increases survival) Side effects of medications can be managed by decreasing doses, switching agents, stopping drugs, or adding additional  therapy. Please let someone in the Heart Failure Clinic know if you have having bothersome side effects so we can modify your regimen. Do not alter your medication regimen without talking to [place].  Medication reminders can help patients remember to take drugs on time. If you are missing or forgetting doses you can try linking behaviors, using pill boxes, or an electronic reminder like an alarm on your phone or an app. Some people can also get automated phone calls as medication reminders.

## 2022-10-15 ENCOUNTER — Ambulatory Visit: Payer: Self-pay

## 2022-10-15 ENCOUNTER — Other Ambulatory Visit: Payer: Self-pay

## 2022-10-15 MED ORDER — DAPAGLIFLOZIN PROPANEDIOL 10 MG PO TABS: 10.0000 mg | ORAL_TABLET | Freq: Every day | ORAL | 6 refills | Status: DC

## 2022-10-15 NOTE — Patient Outreach (Signed)
  Care Coordination   Follow Up Visit Note   10/15/2022 Name: Kathy Becker MRN: [id number] DOB: 1935-12-29  Kathy Becker is a 87 y.o. year old female who sees Nelwyn Salisbury, MD for primary care. I spoke with  Kathy Becker by phone today.  What matters to the patients health and wellness today?  Heart failure management    Goals Addressed             This Visit's Progress    Heart Failure Management       Patient Goals/Self Care Activities: -Patient/Caregiver will self-administer medications as prescribed as evidenced by self-report/primary caregiver report  -Patient/Caregiver will attend all scheduled provider appointments as evidenced by clinician review of documented attendance to scheduled appointments and patient/caregiver report -Patient/Caregiver will call provider office for new concerns or questions as evidenced by review of documented incoming telephone call notes and patient report  -Weigh daily and record (notify MD with 3 lb weight gain over night or 5 lb in a week) -Adhere to low sodium diet   Patient reports doing well.Patient weight 141 to 142 lbs   Reiterated importance of weight for heart failure maintenance.  Denies swelling or shortness of breath.          SDOH assessments and interventions completed:  Yes     Care Coordination Interventions:  Yes, provided   Follow up plan: Follow up call scheduled for August    Encounter Outcome:  Pt. Visit Completed   Bary Leriche, RN, MSN [facility] Care Management Care Management Coordinator Direct Line [phone]

## 2022-10-15 NOTE — Patient Instructions (Signed)
Visit Information  Thank you for taking time to visit with me today. Please don't hesitate to contact me if I can be of assistance to you.   Following are the goals we discussed today:   Goals Addressed             This Visit's Progress    Heart Failure Management       Patient Goals/Self Care Activities: -Patient/Caregiver will self-administer medications as prescribed as evidenced by self-report/primary caregiver report  -Patient/Caregiver will attend all scheduled provider appointments as evidenced by clinician review of documented attendance to scheduled appointments and patient/caregiver report -Patient/Caregiver will call provider office for new concerns or questions as evidenced by review of documented incoming telephone call notes and patient report  -Weigh daily and record (notify MD with 3 lb weight gain over night or 5 lb in a week) -Adhere to low sodium diet   Patient reports doing well.Patient weight 141 to 142 lbs   Reiterated importance of weight for heart failure maintenance.  Denies swelling or shortness of breath.          Our next appointment is by telephone on 12/10/22 at 1100 am  Please call the care guide team at [phone] if you need to cancel or reschedule your appointment.   If you are experiencing a Mental Health or Behavioral Health Crisis or need someone to talk to, please call the Suicide and Crisis Lifeline: 988   Patient verbalizes understanding of instructions and care plan provided today and agrees to view in MyChart. Active MyChart status and patient understanding of how to access instructions and care plan via MyChart confirmed with patient.     The patient has been provided with contact information for the care management team and has been advised to call with any health related questions or concerns.   Bary Leriche, RN, MSN [facility] Care Management Care Management Coordinator Direct Line [phone]

## 2022-10-16 ENCOUNTER — Other Ambulatory Visit (HOSPITAL_COMMUNITY): Payer: Self-pay

## 2022-10-16 ENCOUNTER — Other Ambulatory Visit: Payer: Self-pay

## 2022-10-17 ENCOUNTER — Other Ambulatory Visit (HOSPITAL_COMMUNITY): Payer: Self-pay

## 2022-10-28 ENCOUNTER — Inpatient Hospital Stay
Admission: EM | Admit: 2022-10-28 | Discharge: 2022-10-30 | DRG: 291 | Disposition: A | Discharge status: Home / Self Care | Payer: Medicare Other | Attending: Hospitalist | Admitting: Hospitalist

## 2022-10-28 ENCOUNTER — Emergency Department: Payer: Medicare Other

## 2022-10-28 ENCOUNTER — Encounter: Payer: Self-pay | Admitting: Radiology

## 2022-10-28 ENCOUNTER — Other Ambulatory Visit: Payer: Self-pay

## 2022-10-28 DIAGNOSIS — I2510 Atherosclerotic heart disease of native coronary artery without angina pectoris: Secondary | ICD-10-CM | POA: Diagnosis not present

## 2022-10-28 DIAGNOSIS — Z7982 Long term (current) use of aspirin: Secondary | ICD-10-CM

## 2022-10-28 DIAGNOSIS — I5022 Chronic systolic (congestive) heart failure: Secondary | ICD-10-CM | POA: Diagnosis present

## 2022-10-28 DIAGNOSIS — D72829 Elevated white blood cell count, unspecified: Secondary | ICD-10-CM | POA: Diagnosis present

## 2022-10-28 DIAGNOSIS — E785 Hyperlipidemia, unspecified: Secondary | ICD-10-CM | POA: Diagnosis present

## 2022-10-28 DIAGNOSIS — I517 Cardiomegaly: Secondary | ICD-10-CM | POA: Diagnosis not present

## 2022-10-28 DIAGNOSIS — Z87891 Personal history of nicotine dependence: Secondary | ICD-10-CM | POA: Diagnosis not present

## 2022-10-28 DIAGNOSIS — I714 Abdominal aortic aneurysm, without rupture, unspecified: Secondary | ICD-10-CM | POA: Diagnosis present

## 2022-10-28 DIAGNOSIS — R0602 Shortness of breath: Secondary | ICD-10-CM | POA: Diagnosis not present

## 2022-10-28 DIAGNOSIS — Z888 Allergy status to other drugs, medicaments and biological substances status: Secondary | ICD-10-CM | POA: Diagnosis not present

## 2022-10-28 DIAGNOSIS — I509 Heart failure, unspecified: Secondary | ICD-10-CM

## 2022-10-28 DIAGNOSIS — I5023 Acute on chronic systolic (congestive) heart failure: Secondary | ICD-10-CM | POA: Diagnosis present

## 2022-10-28 DIAGNOSIS — I252 Old myocardial infarction: Secondary | ICD-10-CM

## 2022-10-28 DIAGNOSIS — Z8249 Family history of ischemic heart disease and other diseases of the circulatory system: Secondary | ICD-10-CM

## 2022-10-28 DIAGNOSIS — Z79899 Other long term (current) drug therapy: Secondary | ICD-10-CM

## 2022-10-28 DIAGNOSIS — Z882 Allergy status to sulfonamides status: Secondary | ICD-10-CM | POA: Diagnosis not present

## 2022-10-28 DIAGNOSIS — I130 Hypertensive heart and chronic kidney disease with heart failure and stage 1 through stage 4 chronic kidney disease, or unspecified chronic kidney disease: Secondary | ICD-10-CM | POA: Diagnosis present

## 2022-10-28 DIAGNOSIS — N1832 Chronic kidney disease, stage 3b: Secondary | ICD-10-CM | POA: Diagnosis present

## 2022-10-28 DIAGNOSIS — Z9841 Cataract extraction status, right eye: Secondary | ICD-10-CM

## 2022-10-28 DIAGNOSIS — I10 Essential (primary) hypertension: Secondary | ICD-10-CM | POA: Diagnosis not present

## 2022-10-28 DIAGNOSIS — K219 Gastro-esophageal reflux disease without esophagitis: Secondary | ICD-10-CM | POA: Diagnosis not present

## 2022-10-28 DIAGNOSIS — I110 Hypertensive heart disease with heart failure: Secondary | ICD-10-CM | POA: Diagnosis not present

## 2022-10-28 DIAGNOSIS — N1831 Chronic kidney disease, stage 3a: Secondary | ICD-10-CM | POA: Diagnosis not present

## 2022-10-28 DIAGNOSIS — N183 Chronic kidney disease, stage 3 unspecified: Secondary | ICD-10-CM | POA: Diagnosis present

## 2022-10-28 DIAGNOSIS — R918 Other nonspecific abnormal finding of lung field: Secondary | ICD-10-CM | POA: Diagnosis not present

## 2022-10-28 DIAGNOSIS — R0989 Other specified symptoms and signs involving the circulatory and respiratory systems: Secondary | ICD-10-CM | POA: Diagnosis not present

## 2022-10-28 DIAGNOSIS — R079 Chest pain, unspecified: Secondary | ICD-10-CM | POA: Diagnosis not present

## 2022-10-28 LAB — CBC WITH DIFFERENTIAL/PLATELET
Abs Immature Granulocytes: 0.03 10*3/uL (ref 0.00–0.07)
Basophils Absolute: 0.2 10*3/uL — ABNORMAL HIGH (ref 0.0–0.1)
Basophils Relative: 1 %
Eosinophils Absolute: 0.1 10*3/uL (ref 0.0–0.5)
Eosinophils Relative: 1 %
HCT: 46.8 % — ABNORMAL HIGH (ref 36.0–46.0)
Hemoglobin: 14.3 g/dL (ref 12.0–15.0)
Immature Granulocytes: 0 %
Lymphocytes Relative: 48 %
Lymphs Abs: 6 10*3/uL — ABNORMAL HIGH (ref 0.7–4.0)
MCH: 28.1 pg (ref 26.0–34.0)
MCHC: 30.6 g/dL (ref 30.0–36.0)
MCV: 92.1 fL (ref 80.0–100.0)
Monocytes Absolute: 0.7 10*3/uL (ref 0.1–1.0)
Monocytes Relative: 5 %
Neutro Abs: 5.7 10*3/uL (ref 1.7–7.7)
Neutrophils Relative %: 45 %
Platelets: 172 10*3/uL (ref 150–400)
RBC: 5.08 MIL/uL (ref 3.87–5.11)
RDW: 13.9 % (ref 11.5–15.5)
Smear Review: NORMAL
WBC: 12.7 10*3/uL — ABNORMAL HIGH (ref 4.0–10.5)
nRBC: 0 % (ref 0.0–0.2)

## 2022-10-28 LAB — BASIC METABOLIC PANEL
Anion gap: 12 (ref 5–15)
BUN: 23 mg/dL (ref 8–23)
CO2: 17 mmol/L — ABNORMAL LOW (ref 22–32)
Calcium: 8.7 mg/dL — ABNORMAL LOW (ref 8.9–10.3)
Chloride: 111 mmol/L (ref 98–111)
Creatinine, Ser: 1.3 mg/dL — ABNORMAL HIGH (ref 0.44–1.00)
GFR, Estimated: 40 mL/min — ABNORMAL LOW (ref >60)
Glucose, Bld: 172 mg/dL — ABNORMAL HIGH (ref 70–99)
Potassium: 4 mmol/L (ref 3.5–5.1)
Sodium: 140 mmol/L (ref 135–145)

## 2022-10-28 LAB — BLOOD GAS, VENOUS
Acid-base deficit: 7.5 mmol/L — ABNORMAL HIGH (ref 0.0–2.0)
Bicarbonate: 18.8 mmol/L — ABNORMAL LOW (ref 20.0–28.0)
O2 Saturation: 85.6 %
Patient temperature: 37
pCO2, Ven: 40 mmHg — ABNORMAL LOW (ref 44–60)
pH, Ven: 7.28 (ref 7.25–7.43)
pO2, Ven: 54 mmHg — ABNORMAL HIGH (ref 32–45)

## 2022-10-28 LAB — BRAIN NATRIURETIC PEPTIDE: B Natriuretic Peptide: 926.5 pg/mL — ABNORMAL HIGH (ref 0.0–100.0)

## 2022-10-28 LAB — TROPONIN I (HIGH SENSITIVITY): Troponin I (High Sensitivity): 22 ng/L — ABNORMAL HIGH (ref <18)

## 2022-10-28 MED ORDER — ASPIRIN 81 MG PO TBEC
81.0000 mg | DELAYED_RELEASE_TABLET | Freq: Every day | ORAL | Status: DC
  Administered 2022-10-28 – 2022-10-29 (×2): 81 mg via ORAL
  Filled 2022-10-28 (×2): qty 1

## 2022-10-28 MED ORDER — IPRATROPIUM-ALBUTEROL 0.5-2.5 (3) MG/3ML IN SOLN: 3.0000 mL | Freq: Once | RESPIRATORY_TRACT | Status: DC

## 2022-10-28 MED ORDER — FUROSEMIDE 10 MG/ML IJ SOLN
80.0000 mg | Freq: Once | INTRAMUSCULAR | Status: AC
  Administered 2022-10-28: 80 mg via INTRAVENOUS
  Filled 2022-10-28: qty 8

## 2022-10-28 MED ORDER — MAGNESIUM HYDROXIDE 400 MG/5ML PO SUSP: 30.0000 mL | Freq: Every day | ORAL | Status: DC | PRN

## 2022-10-28 MED ORDER — DAPAGLIFLOZIN PROPANEDIOL 10 MG PO TABS
10.0000 mg | ORAL_TABLET | Freq: Every day | ORAL | Status: DC
  Administered 2022-10-29 – 2022-10-30 (×2): 10 mg via ORAL
  Filled 2022-10-28 (×2): qty 1

## 2022-10-28 MED ORDER — ACETAMINOPHEN 325 MG PO TABS: 650.0000 mg | ORAL_TABLET | Freq: Four times a day (QID) | ORAL | Status: DC | PRN

## 2022-10-28 MED ORDER — FUROSEMIDE 10 MG/ML IJ SOLN
40.0000 mg | Freq: Two times a day (BID) | INTRAMUSCULAR | Status: DC
  Administered 2022-10-29 – 2022-10-30 (×3): 40 mg via INTRAVENOUS
  Filled 2022-10-28 (×3): qty 4

## 2022-10-28 MED ORDER — NITROGLYCERIN 0.4 MG SL SUBL
0.4000 mg | SUBLINGUAL_TABLET | Freq: Once | SUBLINGUAL | Status: AC
  Administered 2022-10-28: 0.4 mg via SUBLINGUAL
  Filled 2022-10-28: qty 1

## 2022-10-28 MED ORDER — ACETAMINOPHEN 650 MG RE SUPP: 650.0000 mg | Freq: Four times a day (QID) | RECTAL | Status: DC | PRN

## 2022-10-28 MED ORDER — PANTOPRAZOLE SODIUM 40 MG PO TBEC
40.0000 mg | DELAYED_RELEASE_TABLET | Freq: Every day | ORAL | Status: DC
  Administered 2022-10-29 – 2022-10-30 (×2): 40 mg via ORAL
  Filled 2022-10-28 (×2): qty 1

## 2022-10-28 MED ORDER — PRAVASTATIN SODIUM 40 MG PO TABS
40.0000 mg | ORAL_TABLET | Freq: Every day | ORAL | Status: DC
  Administered 2022-10-29 – 2022-10-30 (×2): 40 mg via ORAL
  Filled 2022-10-28: qty 1
  Filled 2022-10-28: qty 2

## 2022-10-28 MED ORDER — ALBUTEROL SULFATE (2.5 MG/3ML) 0.083% IN NEBU: 3.0000 mL | INHALATION_SOLUTION | RESPIRATORY_TRACT | Status: DC | PRN

## 2022-10-28 MED ORDER — ONDANSETRON HCL 4 MG PO TABS: 4.0000 mg | ORAL_TABLET | Freq: Four times a day (QID) | ORAL | Status: DC | PRN

## 2022-10-28 MED ORDER — ONDANSETRON HCL 4 MG/2ML IJ SOLN: 4.0000 mg | Freq: Four times a day (QID) | INTRAMUSCULAR | Status: DC | PRN

## 2022-10-28 MED ORDER — IRBESARTAN 150 MG PO TABS
150.0000 mg | ORAL_TABLET | Freq: Every day | ORAL | Status: DC
  Administered 2022-10-29 – 2022-10-30 (×2): 150 mg via ORAL
  Filled 2022-10-28 (×2): qty 1

## 2022-10-28 MED ORDER — ENOXAPARIN SODIUM 30 MG/0.3ML IJ SOSY
30.0000 mg | PREFILLED_SYRINGE | INTRAMUSCULAR | Status: DC
  Administered 2022-10-28 – 2022-10-29 (×2): 30 mg via SUBCUTANEOUS
  Filled 2022-10-28 (×2): qty 0.3

## 2022-10-28 MED ORDER — TRAZODONE HCL 50 MG PO TABS: 25.0000 mg | ORAL_TABLET | Freq: Every evening | ORAL | Status: DC | PRN

## 2022-10-28 NOTE — ED Triage Notes (Signed)
Pt endorses shortness of breath that started about 20 minutes prior to arrival. No history of lung problems, endorses heart issues unable to elaborate.

## 2022-10-28 NOTE — Assessment & Plan Note (Signed)
-   We will continue statin therapy. 

## 2022-10-28 NOTE — Progress Notes (Signed)
Anticoagulation monitoring(Lovenox):  87 yo female ordered Lovenox 40 mg Q24h    Filed Weights   10/28/22 2117  Weight: 65.3 kg (144 lb)   BMI 26.3   Lab Results  Component Value Date   CREATININE 1.30 (H) 10/28/2022   CREATININE 1.32 (H) 10/02/2022   CREATININE 1.27 (H) 09/11/2022   Estimated Creatinine Clearance: 28.1 mL/min (A) (by C-G formula based on SCr of 1.3 mg/dL (H)). Hemoglobin & Hematocrit     Component Value Date/Time   HGB 14.3 10/28/2022 2122   HCT 46.8 (H) 10/28/2022 2122     Per Protocol for Patient with estCrcl < 30 ml/min and BMI < 30, will transition to Lovenox 30 mg Q24h.

## 2022-10-28 NOTE — Assessment & Plan Note (Addendum)
-  The patient will be admitted to a cardiac telemetry bed. - We will continue diuresis with IV Lasix. - We will continue Farxiga, as well as ARB therapy. - We Will follow serial troponins. - We will follow I's and O's and daily weights. -She had a recent 2D echo as mentioned above revealing an EF of 20 to 25%.

## 2022-10-28 NOTE — ED Provider Notes (Signed)
[facility] Provider Note    Event Date/Time   First MD Initiated Contact with Patient 10/28/22 2125     (approximate)   History   Chief Complaint Shortness of Breath   HPI  Kathy Becker is a 87 y.o. female with past medical history of hypertension, hyperlipidemia, CAD, CHF (EF 20 to 25%), CKD, and AAA who presents to the ED complaining of shortness of breath.  Patient reports that she became acutely short of breath about 30 minutes prior to arrival, denies any associated chest pain.  She states that she was feeling well earlier in the day with no recent fevers or cough.  She has not noticed any pain or swelling in her legs.     Physical Exam   Triage Vital Signs: ED Triage Vitals  Enc Vitals Group     BP 10/28/22 2119 (!) 152/118     Pulse Rate 10/28/22 2119 (!) 124     Resp 10/28/22 2119 (!) 21     Temp 10/28/22 2119 98.2 F (36.8 C)     Temp Source 10/28/22 2119 Oral     SpO2 10/28/22 2119 97 %     Weight 10/28/22 2117 144 lb (65.3 kg)     Height 10/28/22 2117 5\' 2"  (1.575 m)     Head Circumference --      Peak Flow --      Pain Score --      Pain Loc --      Pain Edu? --      Excl. in GC? --     Most recent vital signs: Vitals:   10/28/22 2155 10/28/22 2240  BP: (!) 149/133 (!) 116/105  Pulse: (!) 108 (!) 103  Resp: (!) 35 (!) 26  Temp:    SpO2: 95% 95%    Constitutional: Alert and oriented. Eyes: Conjunctivae are normal. Head: Atraumatic. Nose: No congestion/rhinnorhea. Mouth/Throat: Mucous membranes are moist.  Cardiovascular: Tachycardic, regular rhythm. Grossly normal heart sounds.  2+ radial pulses bilaterally. Respiratory: Tachypneic with increased respiratory effort.  No retractions. Lungs with wheezing and rales throughout. Gastrointestinal: Soft and nontender. No distention. Musculoskeletal: No lower extremity tenderness nor edema.  Neurologic:  Normal speech and language. No gross focal neurologic deficits are  appreciated.    ED Results / Procedures / Treatments   Labs (all labs ordered are listed, but only abnormal results are displayed) Labs Reviewed  BRAIN NATRIURETIC PEPTIDE - Abnormal; Notable for the following components:      Result Value   B Natriuretic Peptide 926.5 (*)    All other components within normal limits  CBC WITH DIFFERENTIAL/PLATELET - Abnormal; Notable for the following components:   WBC 12.7 (*)    HCT 46.8 (*)    Lymphs Abs 6.0 (*)    Basophils Absolute 0.2 (*)    All other components within normal limits  BASIC METABOLIC PANEL - Abnormal; Notable for the following components:   CO2 17 (*)    Glucose, Bld 172 (*)    Creatinine, Ser 1.30 (*)    Calcium 8.7 (*)    GFR, Estimated 40 (*)    All other components within normal limits  BLOOD GAS, VENOUS - Abnormal; Notable for the following components:   pCO2, Ven 40 (*)    pO2, Ven 54 (*)    Bicarbonate 18.8 (*)    Acid-base deficit 7.5 (*)    All other components within normal limits  TROPONIN I (HIGH SENSITIVITY) - Abnormal; Notable  for the following components:   Troponin I (High Sensitivity) 22 (*)    All other components within normal limits     EKG  ED ECG REPORT I, Chesley Noon, the attending physician, personally viewed and interpreted this ECG.   Date: 10/28/2022  EKG Time: 21:19  Rate: 126  Rhythm: sinus tachycardia  Axis: Normal  Intervals:none  ST&T Change: None  RADIOLOGY Chest x-ray reviewed and interpreted by me with pulmonary edema, no focal infiltrate or effusion noted.  PROCEDURES:  Critical Care performed: No  Procedures   MEDICATIONS ORDERED IN ED: Medications  nitroGLYCERIN (NITROSTAT) SL tablet 0.4 mg (0.4 mg Sublingual Given 10/28/22 2157)  furosemide (LASIX) injection 80 mg (80 mg Intravenous Given 10/28/22 2200)     IMPRESSION / MDM / ASSESSMENT AND PLAN / ED COURSE  I reviewed the triage vital signs and the nursing notes.                              87 y.o.  female with past medical history of hypertension, hyperlipidemia, CAD, CHF, CKD, and AAA who presents to the ED complaining of increasing difficulty breathing over the past 30 minutes.  Patient's presentation is most consistent with acute presentation with potential threat to life or bodily function.  Differential diagnosis includes, but is not limited to, ACS, PE, CHF, COPD, pneumonia, anemia, electrolyte abnormality, AKI.  Patient ill-appearing and in mild to moderate respiratory distress on arrival, tachycardic and tachypneic with increased work of breathing.  EKG shows sinus tachycardia with no ischemic changes, lungs with wheezing and rales on exam.  Chest x-ray concerning for pulmonary edema, suspect wheezing and rales are related to this as patient has no history of COPD or asthma.  She was given sublingual nitro along with IV Lasix, now reports significant improvement in symptoms and work of breathing has improved.  Mild leukocytosis noted but no evidence of pneumonia, no significant anemia, electrolyte abnormality, or AKI noted.  Troponin mildly elevated but low suspicion for ACS or PE at this time, will continue to trend.  BNP is elevated consistent with CHF.  Case discussed with hospitalist for admission.      FINAL CLINICAL IMPRESSION(S) / ED DIAGNOSES   Final diagnoses:  SOB (shortness of breath)  Acute on chronic congestive heart failure, unspecified heart failure type (HCC)     Rx / DC Orders   ED Discharge Orders     None        Note:  This document was prepared using Dragon voice recognition software and may include unintentional dictation errors.   Chesley Noon, MD 10/28/22 [phone]

## 2022-10-28 NOTE — H&P (Signed)
[place]   PATIENT NAME: Kathy Becker    MR#:  [id number]  DATE OF BIRTH:  [birth year]  DATE OF ADMISSION:  10/28/2022  PRIMARY CARE PHYSICIAN: Nelwyn Salisbury, MD   Patient is coming from: Home  REQUESTING/REFERRING PHYSICIAN: Chesley Noon, MD  CHIEF COMPLAINT:   Chief Complaint  Patient presents with   Shortness of Breath    HISTORY OF PRESENT ILLNESS:  Kathy Becker is a 87 y.o. Caucasian female with medical history significant for coronary artery disease, systolic CHF, hypertension and dyslipidemia, who presented to the emergency room with acute onset of worsening dyspnea that started earlier today.  She admits to dyspnea on exertion as well as lower extremity edema without cough or wheezing or orthopnea or paroxysmal nocturnal dyspnea.  She denies any fever or chills.  No nausea or vomiting or abdominal pain.  No chest pain or palpitations. No fever or chills.  No nausea or vomiting or abdominal pain.  No dysuria, oliguria or hematuria, urgency or frequency or flank pain.  No chest pain or palpitations.  No headache or dizziness or blurred vision. Most recent 2D echo revealed an EF of 20 to 25% with indeterminate diastolic function with moderate to severe mitral valve regurgitation on 08/08/2022.   ED Course:  Upon presentation to the emergency room, BP was 152/118 with heart rate of 124 respiratory to 21, pulse symmetry 97% on room air and temperature 98.2.  Labs revealed VBG with pH 7.28 and HCO3 of 18.8 and BMP revealed a blood glucose of 172 with a CO2 of 17, creatinine 1.3 close to baseline and calcium 8.7.  BNP was 926.5 and high sensitive troponin I was 22.  CBC showed leukocytosis of 12.6 with mild hemoconcentration.  EKG as reviewed by me : EKG showed sinus tachycardia with rate 126 with Q waves anteriorly. Imaging: Portable chest x-ray showed small left and possible trace right pleural effusions, mild cardiomegaly and mild vascular congestion with possible  edema.  The patient was given 80 mg of IV Lasix and 1 sublingual nitroglycerin.  She will be admitted to a cardiac telemetry bed for further evaluation and management. PAST MEDICAL HISTORY:   Past Medical History:  Diagnosis Date   CAD (coronary artery disease)    stress test 08-15-08 showing lateral scar but no reversible ischemia    CHF (congestive heart failure) (HCC)    Hyperlipidemia    Hypertension    Myocardial infarction [facility])    Renal insufficiency     PAST SURGICAL HISTORY:   Past Surgical History:  Procedure Laterality Date   BACK SURGERY  1988   lumbar spine    CATARACT EXTRACTION, BILATERAL     per Dr. Dione Booze    COLONOSCOPY  never   she refuses to ever get one    DILATION AND CURETTAGE OF UTERUS     ENDARTERECTOMY Left 07/20/2018   Procedure: ENDARTERECTOMY CAROTID LEFT;  Surgeon: Sherren Kerns, MD;  Location: [facility] OR;  Service: Vascular;  Laterality: Left;   PATCH ANGIOPLASTY Left 07/20/2018   Procedure: PATCH ANGIOPLASTY WITH HEMASHIELD PLATINUM FINESSE CARDIOVASCULAR PATCH;  Surgeon: Sherren Kerns, MD;  Location: [facility] OR;  Service: Vascular;  Laterality: Left;   TONSILLECTOMY      SOCIAL HISTORY:   Social History   Tobacco Use   Smoking status: Former    Passive exposure: Never   Smokeless tobacco: Never  Substance Use Topics   Alcohol use: No    Alcohol/week: 0.0  standard drinks of alcohol    FAMILY HISTORY:   Family History  Problem Relation Age of Onset   Coronary artery disease Other     DRUG ALLERGIES:   Allergies  Allergen Reactions   Jardiance [Empagliflozin] Shortness Of Breath   Sulfonamide Derivatives     Confusion    REVIEW OF SYSTEMS:   ROS As per history of present illness. All pertinent systems were reviewed above. Constitutional, HEENT, cardiovascular, respiratory, GI, GU, musculoskeletal, neuro, psychiatric, endocrine, integumentary and hematologic systems were reviewed and are otherwise negative/unremarkable except for  positive findings mentioned above in the HPI.   MEDICATIONS AT HOME:   Prior to Admission medications   Medication Sig Start Date End Date Taking? Authorizing Provider  aspirin EC 81 MG tablet Take 81 mg by mouth at bedtime.    [provider]  Cranberry-Vitamin C-Probiotic (AZO CRANBERRY PO) Take 1 capsule by mouth daily at 6 (six) AM.    [provider]  dapagliflozin propanediol (FARXIGA) 10 MG TABS tablet Take 1 tablet (10 mg total) by mouth daily before breakfast. 10/15/22   Delma Freeze, FNP  doxycycline (PERIOSTAT) 20 MG tablet Take by mouth. 08/18/19   [provider]  furosemide (LASIX) 20 MG tablet Take 1 tablet (20 mg total) by mouth daily. 07/04/22   Nelwyn Salisbury, MD  omeprazole (PRILOSEC) 40 MG capsule TAKE 1 CAPSULE(40 MG) BY MOUTH DAILY 07/04/22   Nelwyn Salisbury, MD  pravastatin (PRAVACHOL) 40 MG tablet Take 1 tablet (40 mg total) by mouth daily. 07/04/22   Nelwyn Salisbury, MD  valsartan (DIOVAN) 160 MG tablet Take 1 tablet (160 mg total) by mouth daily. 10/02/22   Delma Freeze, FNP      VITAL SIGNS:  Blood pressure 102/80, pulse 93, temperature 98.2 F (36.8 C), temperature source Oral, resp. rate 20, height 5\' 2"  (1.575 m), weight 65.3 kg, SpO2 95 %.  PHYSICAL EXAMINATION:  Physical Exam  GENERAL:  87 y.o.-year-old patient lying in the bed with no acute distress.  EYES: Pupils equal, round, reactive to light and accommodation. No scleral icterus. Extraocular muscles intact.  HEENT: Head atraumatic, normocephalic. Oropharynx and nasopharynx clear.  NECK:  Supple, no jugular venous distention. No thyroid enlargement, no tenderness.  LUNGS: Diminished bibasal breath breath sounds with bibasilar rales.  No use of accessory muscles of respiration.  CARDIOVASCULAR: Regular rate and rhythm, S1, S2 normal. No murmurs, rubs, or gallops.  ABDOMEN: Soft, nondistended, nontender. Bowel sounds present. No organomegaly or mass.  EXTREMITIES: Trace to 1+  bilateral lower extremity pitting edema with no cyanosis, or clubbing.  NEUROLOGIC: Cranial nerves II through XII are intact. Muscle strength 5/5 in all extremities. Sensation intact. Gait not checked.  PSYCHIATRIC: The patient is alert and oriented x 3.  Normal affect and good eye contact. SKIN: No obvious rash, lesion, or ulcer.   LABORATORY PANEL:   CBC Recent Labs  Lab 10/28/22 2122  WBC 12.7*  HGB 14.3  HCT 46.8*  PLT 172   ------------------------------------------------------------------------------------------------------------------  Chemistries  Recent Labs  Lab 10/28/22 2122  NA 140  K 4.0  CL 111  CO2 17*  GLUCOSE 172*  BUN 23  CREATININE 1.30*  CALCIUM 8.7*   ------------------------------------------------------------------------------------------------------------------  Cardiac Enzymes No results for input(s): "TROPONINI" in the last 168 hours. ------------------------------------------------------------------------------------------------------------------  RADIOLOGY:  DG Chest Port 1 View  Result Date: 10/28/2022 CLINICAL DATA:  Chest pain. EXAM: PORTABLE CHEST 1 VIEW COMPARISON:  Chest radiograph dated 06/28/2022. FINDINGS: Small left  and possible trace right pleural effusions. There is mild cardiomegaly with mild vascular congestion and possibly edema. Left lung base atelectasis or infiltrate. No pneumothorax. Atherosclerotic calcification of the aorta. No acute osseous pathology. IMPRESSION: 1. Small left and possible trace right pleural effusions. 2. Mild cardiomegaly with mild vascular congestion and possibly edema. Electronically Signed   By: Elgie Collard M.D.   On: 10/28/2022 21:44      IMPRESSION AND PLAN:  Assessment and Plan: * Acute on chronic systolic CHF (congestive heart failure) (HCC)  -The patient will be admitted to a cardiac telemetry bed. - We will continue diuresis with IV Lasix. - We will continue Farxiga, as well as ARB  therapy. - We Will follow serial troponins. - We will follow I's and O's and daily weights. -She had a recent 2D echo as mentioned above revealing an EF of 20 to 25%.   Essential hypertension - We will continue her antihypertensives.  Dyslipidemia - We will continue statin therapy.  GERD without esophagitis - We will continue PPI therapy.   DVT prophylaxis: Lovenox.  Advanced Care Planning:  Code Status: full code.  Family Communication:  The plan of care was discussed in details with the patient (and family). I answered all questions. The patient agreed to proceed with the above mentioned plan. Further management will depend upon hospital course. Disposition Plan: Back to previous home environment Consults called: none.  All the records are reviewed and case discussed with ED provider.  Status is: Inpatient   At the time of the admission, it appears that the appropriate admission status for this patient is inpatient.  This is judged to be reasonable and necessary in order to provide the required intensity of service to ensure the patient's safety given the presenting symptoms, physical exam findings and initial radiographic and laboratory data in the context of comorbid conditions.  The patient requires inpatient status due to high intensity of service, high risk of further deterioration and high frequency of surveillance required.  I certify that at the time of admission, it is my clinical judgment that the patient will require inpatient hospital care extending more than 2 midnights.                            Dispo: The patient is from: Home              Anticipated d/c is to: Home              Patient currently is not medically stable to d/c.              Difficult to place patient: No  Hannah Beat M.D on 10/28/2022 at 11:45 PM  [facility]   From 7 PM-7 AM, contact night-coverage [email or web address]  CC: Primary care physician; Nelwyn Salisbury, MD

## 2022-10-28 NOTE — Assessment & Plan Note (Signed)
-   We will continue her antihypertensives. 

## 2022-10-28 NOTE — Assessment & Plan Note (Signed)
-   We will continue PPI therapy 

## 2022-10-29 DIAGNOSIS — N1831 Chronic kidney disease, stage 3a: Secondary | ICD-10-CM

## 2022-10-29 LAB — BASIC METABOLIC PANEL
Anion gap: 9 (ref 5–15)
BUN: 25 mg/dL — ABNORMAL HIGH (ref 8–23)
CO2: 21 mmol/L — ABNORMAL LOW (ref 22–32)
Calcium: 8.5 mg/dL — ABNORMAL LOW (ref 8.9–10.3)
Chloride: 108 mmol/L (ref 98–111)
Creatinine, Ser: 1.27 mg/dL — ABNORMAL HIGH (ref 0.44–1.00)
GFR, Estimated: 41 mL/min — ABNORMAL LOW (ref >60)
Glucose, Bld: 106 mg/dL — ABNORMAL HIGH (ref 70–99)
Potassium: 3.9 mmol/L (ref 3.5–5.1)
Sodium: 138 mmol/L (ref 135–145)

## 2022-10-29 LAB — MAGNESIUM: Magnesium: 2.1 mg/dL (ref 1.7–2.4)

## 2022-10-29 LAB — CBC
HCT: 41.1 % (ref 36.0–46.0)
Hemoglobin: 12.9 g/dL (ref 12.0–15.0)
MCH: 28.3 pg (ref 26.0–34.0)
MCHC: 31.4 g/dL (ref 30.0–36.0)
MCV: 90.1 fL (ref 80.0–100.0)
Platelets: 133 10*3/uL — ABNORMAL LOW (ref 150–400)
RBC: 4.56 MIL/uL (ref 3.87–5.11)
RDW: 13.6 % (ref 11.5–15.5)
WBC: 7.5 10*3/uL (ref 4.0–10.5)
nRBC: 0 % (ref 0.0–0.2)

## 2022-10-29 MED ORDER — AMLODIPINE BESYLATE 5 MG PO TABS
5.0000 mg | ORAL_TABLET | Freq: Every day | ORAL | Status: DC
  Administered 2022-10-30: 5 mg via ORAL
  Filled 2022-10-29 (×2): qty 1

## 2022-10-29 NOTE — ED Notes (Signed)
Report received, pt brought to ed rm 35 at this time, this RN now assuming care.

## 2022-10-29 NOTE — ED Notes (Addendum)
This RN to bedside. Per previous RN - pt refused to change out of her clothing that was soiled with urine. Pt changing at this time, gown and mesh underwear provided. Personal care supplies provided to pt. Pt ambulatory to and from bathroom independently and without difficulty. Pt back to bed and reconnected to monitoring.

## 2022-10-29 NOTE — ED Notes (Signed)
Pt ambulatory to bathroom independently. Hat placed in toilet to collect urine, pt instructed to pee in hat for urine measurement. Pt peed on bathroom floor, unable to obtain accurate measurement other than that went into hat. Pt back into bed.

## 2022-10-29 NOTE — Progress Notes (Addendum)
  Progress Note   Patient: Kathy Becker ZOX:[id number] DOB: 03-08-1936 DOA: 10/28/2022     1 DOS: the patient was seen and examined on 10/29/2022   Brief hospital course: Kathy Becker is a 87 y.o. Caucasian female with medical history significant for coronary artery disease, systolic CHF, hypertension and dyslipidemia, who presented to the emergency room with acute onset of worsening dyspnea that started earlier today.  She admits to dyspnea on exertion as well as lower extremity edema. Most recent 2D echo revealed an EF of 20 to 25% with indeterminate diastolic function with moderate to severe mitral valve regurgitation on 08/08/2022.  Patient is admitted for Systolic CHF flare up management, started IV Lasix, GDMT.   Assessment and Plan: * Acute on chronic systolic CHF (congestive heart failure) (HCC) EF 20-25%, states she is complaint with medications. Continue to monitor vitals, telemetry Continue IV Lasix 40 mg twice daily for today. Continue Farxiga, as well as ARB therapy. Elevated troponin secondary to CHF. Monitor strict I's and O's and daily weights.  Essential hypertension BP stable, continue Irbesartan, started Norvasc home dose.  CKD stage 3 Stable. Continue to monitor while on IV diuresis.  GERD without esophagitis on PPI therapy.  PT/ OT evaluation. Nursing supportive care. DVT prophylaxis- Lovenox CODE STATUS- Full code        Subjective: Patient is seen and examined today morning. She is lying in bed, has mild shortness of breath while exertion. No chest pain, cough. Eating fair.  Physical Exam: Vitals:   10/29/22 0719 10/29/22 0800 10/29/22 0822 10/29/22 0845  BP: (!) 116/97 (!) 132/93    Pulse: 81 80  97  Resp: 15 19  17   Temp:   97.9 F (36.6 C)   TempSrc:   Oral   SpO2: 97% 96%  95%  Weight:      Height:       General - Elderly Caucasian female, no apparent distress HEENT - PERRLA, EOMI, atraumatic head, non tender sinuses. Lung - Clear, diffuse  rales. Heart - S1, S2 heard, no murmurs, rubs, trace pedal edema Neuro - Alert, awake and oriented x 3, non focal exam. Skin - Warm and dry. Data Reviewed:  CBC, BMP  Family Communication: Patient understands and agrees with above care plan. She lives with her daughter.   Disposition: Status is: Inpatient Remains inpatient appropriate because: IV diuresis  Planned Discharge Destination: Home    Time spent: 45 minutes  Author: Marcelino Duster, MD 10/29/2022 12:26 PM  For on call review [email or web address].

## 2022-10-29 NOTE — ED Notes (Signed)
This RN entered patients room to get her morning labs and noted pt on was on her side and her shorts appeared wet. Pt refused this RN's offer to take off her wet shorts and give her some mesh underwear. Pt stated she just wanted to go back to sleep. This RN reminded her where the call bell was and to let this RN know if she changes her mind.

## 2022-10-30 NOTE — TOC Transition Note (Signed)
Transition of Care [facility]) - CM/SW Discharge Note   Patient Details  Name: Kathy Becker MRN: [id number] Date of Birth: March 03, 1936  [facility] CM/SW Contact:  Margarito Liner, LCSW Phone Number: 10/30/2022, 3:34 PM   Clinical Narrative:   Patient has orders to discharge home today. No further concerns. CSW signing off.  Final next level of care: Home/Self Care Barriers to Discharge: Barriers Resolved   Patient Goals and CMS Choice      Discharge Placement                  Patient to be transferred to facility by: One of her daughters   Patient and family notified of of transfer: 10/30/22  Discharge Plan and Services Additional resources added to the After Visit Summary for       Post Acute Care Choice: NA                               Social Determinants of Health (SDOH) Interventions SDOH Screenings   Food Insecurity: No Food Insecurity (10/29/2022)  Housing: Low Risk  (10/29/2022)  Transportation Needs: No Transportation Needs (10/29/2022)  Utilities: Not At Risk (10/29/2022)  Alcohol Screen: Low Risk  (06/03/2022)  Depression (PHQ2-9): Low Risk  (10/15/2022)  Financial Resource Strain: Low Risk  (06/03/2022)  Physical Activity: Inactive (06/03/2022)  Social Connections: Moderately Integrated (06/03/2022)  Stress: No Stress Concern Present (06/03/2022)  Tobacco Use: Medium Risk (10/28/2022)     Readmission Risk Interventions     No data to display

## 2022-10-30 NOTE — Discharge Instructions (Signed)

## 2022-10-30 NOTE — TOC Initial Note (Signed)
Transition of Care [facility]) - Initial/Assessment Note    Patient Details  Name: Kathy Becker MRN: [id number] Date of Birth: 12-03-1935  Transition of Care [facility]) CM/SW Contact:    Margarito Liner, LCSW Phone Number: 10/30/2022, 12:06 PM  Clinical Narrative:  CSW met with patient. No supports at bedside. CSW introduced role and explained that discharge planning would be discussed. PCP is Gershon Crane, MD. Patient drives herself to appointments. Pharmacy is [facility] on the [address] of [address]. [employer] [address] [employer]. No issues obtaining medications. Patient lives home with her daughter. No home health or DME use prior to admission. No further concerns. CSW encouraged patient to contact CSW as needed. CSW will continue to follow patient for support and facilitate return home once stable. One of her two daughters will transport her home at discharge.                Expected Discharge Plan: Home/Self Care Barriers to Discharge: Continued Medical Work up   Patient Goals and CMS Choice            Expected Discharge Plan and Services     Post Acute Care Choice: NA Living arrangements for the past 2 months: Single Family Home                                      Prior Living Arrangements/Services Living arrangements for the past 2 months: Single Family Home Lives with:: Adult Children Patient language and need for interpreter reviewed:: Yes Do you feel safe going back to the place where you live?: Yes      Need for Family Participation in Patient Care: Yes (Comment) Care giver support system in place?: Yes (comment)   Criminal Activity/Legal Involvement Pertinent to Current Situation/Hospitalization: No - Comment as needed  Activities of Daily Living Home Assistive Devices/Equipment: None ADL Screening (condition at time of admission) Patient's cognitive ability adequate to safely complete daily activities?: Yes Is the patient deaf or have difficulty hearing?:  No Does the patient have difficulty seeing, even when wearing glasses/contacts?: No Does the patient have difficulty concentrating, remembering, or making decisions?: No Patient able to express need for assistance with ADLs?: Yes Does the patient have difficulty dressing or bathing?: No Independently performs ADLs?: Yes (appropriate for developmental age) Does the patient have difficulty walking or climbing stairs?: No Weakness of Legs: None Weakness of Arms/Hands: None  Permission Sought/Granted                  Emotional Assessment Appearance:: Appears stated age Attitude/Demeanor/Rapport: Engaged, Gracious Affect (typically observed): Accepting, Appropriate, Calm, Pleasant Orientation: : Oriented to Place, Oriented to Self, Oriented to  Time, Oriented to Situation Alcohol / Substance Use: Not Applicable Psych Involvement: No (comment)  Admission diagnosis:  SOB (shortness of breath) [R06.02] Acute on chronic systolic CHF (congestive heart failure) (HCC) [I50.23] Acute on chronic congestive heart failure, unspecified heart failure type (HCC) [I50.9] Patient Active Problem List   Diagnosis Date Noted   Acute on chronic systolic CHF (congestive heart failure) (HCC) 10/28/2022   Dyslipidemia 10/28/2022   GERD without esophagitis 10/28/2022   AAA (abdominal aortic aneurysm) (HCC) 06/29/2020   GERD (gastroesophageal reflux disease) 06/29/2020   Carotid artery stenosis, symptomatic, left 07/20/2018   CKD (chronic kidney disease) stage 3, GFR 30-59 ml/min (HCC) 06/22/2018   Hyperlipidemia 06/17/2007   Essential hypertension 06/17/2007   MYOCARDIAL INFARCTION,  HX OF 06/17/2007   Coronary atherosclerosis 06/17/2007   PCP:  Nelwyn Salisbury, MD Pharmacy:   [id number] - [place], [place] - [address] AT [facility] [address] [facility] [address] [place] [address] Phone: [phone] Fax: [phone]     Social Determinants of Health  (SDOH) Social History: SDOH Screenings   Food Insecurity: No Food Insecurity (10/29/2022)  Housing: Low Risk  (10/29/2022)  Transportation Needs: No Transportation Needs (10/29/2022)  Utilities: Not At Risk (10/29/2022)  Alcohol Screen: Low Risk  (06/03/2022)  Depression (PHQ2-9): Low Risk  (10/15/2022)  Financial Resource Strain: Low Risk  (06/03/2022)  Physical Activity: Inactive (06/03/2022)  Social Connections: Moderately Integrated (06/03/2022)  Stress: No Stress Concern Present (06/03/2022)  Tobacco Use: Medium Risk (10/28/2022)   SDOH Interventions:     Readmission Risk Interventions     No data to display

## 2022-10-30 NOTE — Progress Notes (Signed)
Mobility Specialist - Progress Note   10/30/22 1030  Mobility  Activity Ambulated with assistance in hallway;Stood at bedside;Dangled on edge of bed  Level of Assistance Standby assist, set-up cues, supervision of patient - no hands on  Assistive Device None  Distance Ambulated (ft) 300 ft  Activity Response Tolerated well  Mobility Referral Yes  $Mobility charge 1 Mobility  Mobility Specialist Start Time (ACUTE ONLY) 1013  Mobility Specialist Stop Time (ACUTE ONLY) 1024  Mobility Specialist Time Calculation (min) (ACUTE ONLY) 11 min   Pt sitting in recliner on RA upon arrival. Pt STS and ambulates in hallway SBA with no LOB noted. Pt returns to recliner with needs in reach.   Terrilyn Saver  Mobility Specialist  10/30/22 10:31 AM

## 2022-10-30 NOTE — Discharge Summary (Signed)
Physician Discharge Summary   Kathy Becker  female DOB: 1935-12-13  ZOX:[id number]  PCP: Nelwyn Salisbury, MD  Admit date: 10/28/2022 Discharge date: 10/30/2022  Admitted From: home Disposition:  home CODE STATUS: Full code   Hospital Course:  For full details, please see H&P, progress notes, consult notes and ancillary notes.  Briefly,  Kathy Becker is a 87 y.o. Caucasian female with medical history significant for coronary artery disease, systolic CHF, hypertension, who presented to the emergency room with acute onset of worsening dyspnea that started earlier in the day as well as lower extremity edema.   * Acute on chronic systolic CHF (congestive heart failure) (HCC) Last LVEF 25 to 30% in April 2024.  Stated she is complaint with medications.  Per home med list, pt takes oral Lasix 20 mg daily prescribed by her PCP. --CXR on presentation showed small pleural effusion and mild vascular congestion and possibly edema. --pt received IV Lasix 40 mg x4 with improvement in her symptoms. --Pt was discharged back on home oral lasix 20 mg daily, and will f/u with her PCP in a week for adjustment if needed.  Essential hypertension --Not on Lisinopril and amlodipine PTA.  --continue home valsartan.   CKD stage 3b Stable.    GERD without esophagitis cont PPI therapy.   Discharge Diagnoses:  Principal Problem:   Acute on chronic systolic CHF (congestive heart failure) (HCC) Active Problems:   Essential hypertension   Dyslipidemia   CKD (chronic kidney disease) stage 3, GFR 30-59 ml/min (HCC)   GERD without esophagitis   30 Day Unplanned Readmission Risk Score    Flowsheet Row ED to Hosp-Admission (Current) from 10/28/2022 in [facility]  30 Day Unplanned Readmission Risk Score (%) 14.92 Filed at 10/30/2022 1200       This score is the patient's risk of an unplanned readmission within 30 days of being discharged (0 -100%). The score is based on  dignosis, age, lab data, medications, orders, and past utilization.   Low:  0-14.9   Medium: 15-21.9   High: 22-29.9   Extreme: 30 and above         Discharge Instructions:  Allergies as of 10/30/2022       Reactions   Jardiance [empagliflozin] Shortness Of Breath   Sulfonamide Derivatives    Confusion        Medication List     STOP taking these medications    amLODipine 5 MG tablet Commonly known as: NORVASC   doxycycline 20 MG tablet Commonly known as: PERIOSTAT   lisinopril 20 MG tablet Commonly known as: ZESTRIL       TAKE these medications    aspirin EC 81 MG tablet Take 81 mg by mouth at bedtime.   AZO CRANBERRY PO Take 1 capsule by mouth daily at 6 (six) AM.   dapagliflozin propanediol 10 MG Tabs tablet Commonly known as: Farxiga Take 1 tablet (10 mg total) by mouth daily before breakfast.   furosemide 20 MG tablet Commonly known as: Lasix Take 1 tablet (20 mg total) by mouth daily.   omeprazole 40 MG capsule Commonly known as: PRILOSEC TAKE 1 CAPSULE(40 MG) BY MOUTH DAILY   pravastatin 40 MG tablet Commonly known as: PRAVACHOL Take 1 tablet (40 mg total) by mouth daily.   valsartan 160 MG tablet Commonly known as: Diovan Take 1 tablet (160 mg total) by mouth daily.         Follow-up Information  Clarisa Kindred A, FNP Follow up in 1 week(s).   Specialty: Family Medicine Contact information: [address] [place] [address] [phone]                 Allergies  Allergen Reactions   Jardiance [Empagliflozin] Shortness Of Breath   Sulfonamide Derivatives     Confusion     The results of significant diagnostics from this hospitalization (including imaging, microbiology, ancillary and laboratory) are listed below for reference.   Consultations:   Procedures/Studies: DG Chest Port 1 View  Result Date: 10/28/2022 CLINICAL DATA:  Chest pain. EXAM: PORTABLE CHEST 1 VIEW COMPARISON:  Chest  radiograph dated 06/28/2022. FINDINGS: Small left and possible trace right pleural effusions. There is mild cardiomegaly with mild vascular congestion and possibly edema. Left lung base atelectasis or infiltrate. No pneumothorax. Atherosclerotic calcification of the aorta. No acute osseous pathology. IMPRESSION: 1. Small left and possible trace right pleural effusions. 2. Mild cardiomegaly with mild vascular congestion and possibly edema. Electronically Signed   By: Elgie Collard M.D.   On: 10/28/2022 21:44      Labs: BNP (last 3 results) Recent Labs    06/28/22 1026 10/28/22 2122  BNP 762.8* 926.5*   Basic Metabolic Panel: Recent Labs  Lab 10/28/22 2122 10/29/22 0500  NA 140 138  K 4.0 3.9  CL 111 108  CO2 17* 21*  GLUCOSE 172* 106*  BUN 23 25*  CREATININE 1.30* 1.27*  CALCIUM 8.7* 8.5*  MG  --  2.1   Liver Function Tests: No results for input(s): "AST", "ALT", "ALKPHOS", "BILITOT", "PROT", "ALBUMIN" in the last 168 hours. No results for input(s): "LIPASE", "AMYLASE" in the last 168 hours. No results for input(s): "AMMONIA" in the last 168 hours. CBC: Recent Labs  Lab 10/28/22 2122 10/29/22 0500  WBC 12.7* 7.5  NEUTROABS 5.7  --   HGB 14.3 12.9  HCT 46.8* 41.1  MCV 92.1 90.1  PLT 172 133*   Cardiac Enzymes: No results for input(s): "CKTOTAL", "CKMB", "CKMBINDEX", "TROPONINI" in the last 168 hours. BNP: Invalid input(s): "POCBNP" CBG: No results for input(s): "GLUCAP" in the last 168 hours. D-Dimer No results for input(s): "DDIMER" in the last 72 hours. Hgb A1c No results for input(s): "HGBA1C" in the last 72 hours. Lipid Profile No results for input(s): "CHOL", "HDL", "LDLCALC", "TRIG", "CHOLHDL", "LDLDIRECT" in the last 72 hours. Thyroid function studies No results for input(s): "TSH", "T4TOTAL", "T3FREE", "THYROIDAB" in the last 72 hours.  Invalid input(s): "FREET3" Anemia work up No results for input(s): "VITAMINB12", "FOLATE", "FERRITIN", "TIBC",  "IRON", "RETICCTPCT" in the last 72 hours. Urinalysis    Component Value Date/Time   COLORURINE YELLOW 01/05/2021 0908   APPEARANCEUR CLEAR 01/05/2021 0908   LABSPEC 1.010 01/05/2021 0908   PHURINE 6.0 01/05/2021 0908   GLUCOSEU NEGATIVE 01/05/2021 0908   HGBUR NEGATIVE 01/05/2021 0908   HGBUR small 07/08/2007 0829   BILIRUBINUR NEGATIVE 01/05/2021 0908   BILIRUBINUR neg 01/04/2021 1347   KETONESUR NEGATIVE 01/05/2021 0908   PROTEINUR Positive (A) 01/04/2021 1347   PROTEINUR NEGATIVE 07/10/2018 1321   UROBILINOGEN 0.2 01/05/2021 0908   NITRITE NEGATIVE 01/05/2021 0908   LEUKOCYTESUR SMALL (A) 01/05/2021 0908   Sepsis Labs Recent Labs  Lab 10/28/22 2122 10/29/22 0500  WBC 12.7* 7.5   Microbiology No results found for this or any previous visit (from the past 240 hour(s)).   Total time spend on discharging this patient, including the last patient exam, discussing the hospital stay, instructions  for ongoing care as it relates to all pertinent caregivers, as well as preparing the medical discharge records, prescriptions, and/or referrals as applicable, is 40 minutes.    Darlin Priestly, MD  [facility] 10/30/2022, 3:29 PM

## 2022-10-30 NOTE — Progress Notes (Signed)
Kathy Becker  A and O x 4. VSS. Pt tolerating diet well. No complaints of pain or nausea. IV removed intact, prescriptions given. Pt voiced understanding of discharge instructions with no further questions. Pt discharged via wheelchair with axillary.    Allergies as of 10/30/2022       Reactions   Jardiance [empagliflozin] Shortness Of Breath   Sulfonamide Derivatives    Confusion        Medication List     STOP taking these medications    amLODipine 5 MG tablet Commonly known as: NORVASC   doxycycline 20 MG tablet Commonly known as: PERIOSTAT   lisinopril 20 MG tablet Commonly known as: ZESTRIL       TAKE these medications    aspirin EC 81 MG tablet Take 81 mg by mouth at bedtime.   AZO CRANBERRY PO Take 1 capsule by mouth daily at 6 (six) AM.   dapagliflozin propanediol 10 MG Tabs tablet Commonly known as: Farxiga Take 1 tablet (10 mg total) by mouth daily before breakfast.   furosemide 20 MG tablet Commonly known as: Lasix Take 1 tablet (20 mg total) by mouth daily.   omeprazole 40 MG capsule Commonly known as: PRILOSEC TAKE 1 CAPSULE(40 MG) BY MOUTH DAILY   pravastatin 40 MG tablet Commonly known as: PRAVACHOL Take 1 tablet (40 mg total) by mouth daily.   valsartan 160 MG tablet Commonly known as: Diovan Take 1 tablet (160 mg total) by mouth daily.        Vitals:   10/30/22 0749 10/30/22 1148  BP: 135/73 100/89  Pulse: 72 83  Resp: 18 18  Temp: (!) 97.5 F (36.4 C) (!) 97.5 F (36.4 C)  SpO2: 95% 96%    Suzzanne Cloud

## 2022-11-01 ENCOUNTER — Telehealth: Payer: Self-pay

## 2022-11-01 NOTE — Transitions of Care (Post Inpatient/ED Visit) (Signed)
11/01/2022  Name: Kathy Becker MRN: [id number] DOB: 04-21-1936  Today's TOC FU Call Status: Today's TOC FU Call Status:: Successful TOC FU Call Competed TOC FU Call Complete Date: 11/01/22  Transition Care Management Follow-up Telephone Call Date of Discharge: 10/30/22 Discharge Facility: [facility]) Type of Discharge: Inpatient Admission Primary Inpatient Discharge Diagnosis:: "acute on chronic HF" How have you been since you were released from the hospital?: Better (Pt reports overall she is doing well-just still al little weak but knows its to be expected."She rested well last night. Appetite is good. LBM today. She has a slight headache today but hasn't had to take anything.) Any questions or concerns?: No  Items Reviewed: Did you receive and understand the discharge instructions provided?: Yes Medications obtained,verified, and reconciled?: Yes (Medications Reviewed) Any new allergies since your discharge?: No Dietary orders reviewed?: Yes Type of Diet Ordered:: low salt/heart healthy Do you have support at home?: Yes People in Home: child(ren), adult Name of Support/Comfort Primary Source: [name]  Medications Reviewed Today: Medications Reviewed Today     Reviewed by Charlyn Minerva, RN (Registered Nurse) on 11/01/22 at 1034  Med List Status: <None>   Medication Order Taking? Sig Documenting Provider Last Dose Status Informant  aspirin EC 81 MG tablet [id number] Yes Take 81 mg by mouth at bedtime. [provider] Taking Active Self  Cranberry-Vitamin C-Probiotic (AZO CRANBERRY PO) [id number] Yes Take 1 capsule by mouth daily at 6 (six) AM. [provider] Taking Active Self  dapagliflozin propanediol (FARXIGA) 10 MG TABS tablet [id number] Yes Take 1 tablet (10 mg total) by mouth daily before breakfast. Delma Freeze, FNP Taking Active Self  furosemide (LASIX) 20 MG tablet [id number] Yes Take 1 tablet (20 mg  total) by mouth daily. Nelwyn Salisbury, MD Taking Active Self  omeprazole (PRILOSEC) 40 MG capsule [id number] Yes TAKE 1 CAPSULE(40 MG) BY MOUTH DAILY Nelwyn Salisbury, MD Taking Active Self  pravastatin (PRAVACHOL) 40 MG tablet [id number] Yes Take 1 tablet (40 mg total) by mouth daily. Nelwyn Salisbury, MD Taking Active Self  valsartan (DIOVAN) 160 MG tablet [id number] Yes Take 1 tablet (160 mg total) by mouth daily. Delma Freeze, FNP Taking Active Self            Home Care and Equipment/Supplies: Were Home Health Services Ordered?: NA Any new equipment or medical supplies ordered?: NA  Functional Questionnaire: Do you need assistance with bathing/showering or dressing?: No Do you need assistance with meal preparation?: No Do you need assistance with eating?: No Do you have difficulty maintaining continence: No Do you need assistance with getting out of bed/getting out of a chair/moving?: No Do you have difficulty managing or taking your medications?: No (pt voices that she fills her own pill box weekly)  Follow up appointments reviewed: PCP Follow-up appointment confirmed?: Yes Date of PCP follow-up appointment?: 11/07/22 Follow-up Provider: Dr. Clent Ridges Specialist [facility] Follow-up appointment confirmed?: Yes Date of Specialist follow-up appointment?: 11/11/22 (Pt states she had on her calendar appt with cardiology 7/10-was not aware of 11/11/22-appt-wanting to know why 7/10 appt cancelled-RN CM unable to provide info-pt given office number to call and discuss with staff as she voices unable to attend 7/15 appt) Follow-Up Specialty Provider:: Tomasita Morrow Do you need transportation to your follow-up appointment?: No Do you understand care options if your condition(s) worsen?: Yes-patient verbalized understanding  SDOH Interventions Today    Flowsheet Row Most Recent Value  SDOH Interventions   Food Insecurity  Interventions Intervention Not Indicated  Transportation Interventions  Intervention Not Indicated      TOC Interventions Today    Flowsheet Row Most Recent Value  TOC Interventions   TOC Interventions Discussed/Reviewed TOC Interventions Discussed, Arranged PCP follow up within 7 days/Care Guide scheduled      Interventions Today    Flowsheet Row Most Recent Value  Chronic Disease   Chronic disease during today's visit Congestive Heart Failure (CHF)  General Interventions   General Interventions Discussed/Reviewed General Interventions Discussed, Doctor Visits, Durable Medical Equipment (DME), Communication with  Doctor Visits Discussed/Reviewed Doctor Visits Discussed, PCP, Specialist  Durable Medical Equipment (DME) Other  [discussed importance of monitoring wgt-proper technique, keeping wgt log and taking to MD appts]  PCP/Specialist Visits Compliance with follow-up visit  Communication with RN  [note routed to assigned RN care coordinator-pt already has follow up appt scheduled with nurse]  Education Interventions   Education Provided Provided Education  Provided Verbal Education On Medication, When to see the doctor, Nutrition  Nutrition Interventions   Nutrition Discussed/Reviewed Nutrition Discussed, Adding fruits and vegetables, Increasing proteins, Decreasing fats, Fluid intake, Decreasing salt  Pharmacy Interventions   Pharmacy Dicussed/Reviewed Pharmacy Topics Discussed, Medications and their functions  Safety Interventions   Safety Discussed/Reviewed Safety Discussed, Home Safety  Home Safety Assistive Devices  [pt states she has a cane-keeps it in trunk of car and has never had to use it]       Antionette Fairy, RN,BSN,CCM [facility] Care Management Care Management Community Coordinator Direct Phone: [phone] Toll Free: [phone] Fax: [phone]

## 2022-11-06 ENCOUNTER — Encounter: Payer: Medicare Other | Admitting: Family

## 2022-11-07 ENCOUNTER — Encounter: Payer: Self-pay | Admitting: Family Medicine

## 2022-11-07 ENCOUNTER — Ambulatory Visit (INDEPENDENT_AMBULATORY_CARE_PROVIDER_SITE_OTHER): Payer: Medicare Other | Admitting: Family Medicine

## 2022-11-07 VITALS — BP 110/76 | HR 72 | Temp 97.9°F | Wt 139.2 lb

## 2022-11-07 DIAGNOSIS — I10 Essential (primary) hypertension: Secondary | ICD-10-CM | POA: Diagnosis not present

## 2022-11-07 DIAGNOSIS — I5023 Acute on chronic systolic (congestive) heart failure: Secondary | ICD-10-CM | POA: Diagnosis not present

## 2022-11-07 DIAGNOSIS — N1832 Chronic kidney disease, stage 3b: Secondary | ICD-10-CM

## 2022-11-07 NOTE — Progress Notes (Signed)
   Subjective:    Patient ID: Kathy Becker, female    DOB: 1936-04-29, 87 y.o.   MRN: [id number]  HPI Here for a transitional care follow up after a hospital stay from 10-28-22 to 10-30-22 for an acute exacerbation of CHF. She presented with SOB and increasing LE edema. Of note, her last ECHO on 08-08-22 showed an EF of 25-30% with global hypokinesis. Diastolic parameters could not be measured. On presentation to the ED her creatinine was 1.30 and GFR was 41 (at her baseline). Her BNP was quite elevated at 926. A CXR showed small bilateral pleural effusions and vascular congestion. She was treated with IV Lasix with immediate improvement in her symptoms. At DC her Lisinopril and Amlodipine were stopped, and she was started on Farxiga 10 mg daily and Valsartan 160 mg daily. Her Lasix was maintained at 20 mg daily. Since going home she has felt great. No SOB and no swelling in the feet.    Review of Systems  Constitutional: Negative.   Respiratory: Negative.    Cardiovascular: Negative.   Gastrointestinal: Negative.   Genitourinary: Negative.   Neurological: Negative.        Objective:   Physical Exam Constitutional:      Appearance: Normal appearance.  Cardiovascular:     Rate and Rhythm: Normal rate and regular rhythm.     Pulses: Normal pulses.     Heart sounds: Normal heart sounds.  Pulmonary:     Effort: Pulmonary effort is normal.     Breath sounds: Normal breath sounds.  Musculoskeletal:     Right lower leg: No edema.     Left lower leg: No edema.  Neurological:     Mental Status: She is alert and oriented to person, place, and time. Mental status is at baseline.           Assessment & Plan:  She is doing well after treatment for CHF. We will keep her on the current medications. She knows to contact us if she notices any fluid accumulation in her feet. She has a Cardiology follow up with Clarisa Kindred FNP on 11-12-22. We spent a total of (35   ) minutes reviewing records and  discussing these issues.  Gershon Crane, MD

## 2022-11-11 ENCOUNTER — Encounter: Payer: Medicare Other | Admitting: Family

## 2022-11-12 ENCOUNTER — Ambulatory Visit: Payer: Medicare Other | Attending: Family | Admitting: Family

## 2022-11-12 ENCOUNTER — Encounter: Payer: Self-pay | Admitting: Family

## 2022-11-12 VITALS — BP 156/95 | HR 87 | Wt 139.8 lb

## 2022-11-12 DIAGNOSIS — I509 Heart failure, unspecified: Secondary | ICD-10-CM | POA: Diagnosis not present

## 2022-11-12 DIAGNOSIS — E785 Hyperlipidemia, unspecified: Secondary | ICD-10-CM | POA: Insufficient documentation

## 2022-11-12 DIAGNOSIS — Z79899 Other long term (current) drug therapy: Secondary | ICD-10-CM | POA: Diagnosis not present

## 2022-11-12 DIAGNOSIS — I428 Other cardiomyopathies: Secondary | ICD-10-CM | POA: Diagnosis not present

## 2022-11-12 DIAGNOSIS — I252 Old myocardial infarction: Secondary | ICD-10-CM | POA: Insufficient documentation

## 2022-11-12 DIAGNOSIS — I2510 Atherosclerotic heart disease of native coronary artery without angina pectoris: Secondary | ICD-10-CM | POA: Insufficient documentation

## 2022-11-12 DIAGNOSIS — N189 Chronic kidney disease, unspecified: Secondary | ICD-10-CM | POA: Diagnosis not present

## 2022-11-12 DIAGNOSIS — I6521 Occlusion and stenosis of right carotid artery: Secondary | ICD-10-CM | POA: Insufficient documentation

## 2022-11-12 DIAGNOSIS — I714 Abdominal aortic aneurysm, without rupture, unspecified: Secondary | ICD-10-CM | POA: Insufficient documentation

## 2022-11-12 DIAGNOSIS — I10 Essential (primary) hypertension: Secondary | ICD-10-CM | POA: Diagnosis not present

## 2022-11-12 DIAGNOSIS — I130 Hypertensive heart and chronic kidney disease with heart failure and stage 1 through stage 4 chronic kidney disease, or unspecified chronic kidney disease: Secondary | ICD-10-CM | POA: Diagnosis not present

## 2022-11-12 DIAGNOSIS — I5022 Chronic systolic (congestive) heart failure: Secondary | ICD-10-CM | POA: Diagnosis not present

## 2022-11-12 MED ORDER — METOPROLOL SUCCINATE ER 25 MG PO TB24: 25.0000 mg | ORAL_TABLET | Freq: Every day | ORAL | 5 refills | Status: DC

## 2022-11-12 NOTE — Progress Notes (Signed)
PCP: Gershon Crane, MD (last seen 07/24) Primary Cardiologist: None  HPI:  Kathy Becker is a 87 y/o female with a history of  CAD (MI), hyperlipidemia, HTN, CKD, tobacco use and chronic heart failure.   Echo 06/23/2018: EF 40-45% along with elevated mean left atrial pressure Echo 08/08/22: EF 25-30% along with mild LAE and moderate/ severe MR.   Was in the ED 06/28/22 due to acute on chronic heart failure. IV lasix given w/ improvement of symptoms. Admitted 10/28/22 due to acute onset of worsening dyspnea as well as lower extremity edema. IV diuresed and transitioned to oral diuretics with improvement of symptoms.   She presents today for a HF f/u visit with a chief complaint of minimal fatigue with moderate exertion. Chronic although intermittent in nature. No other symptoms and she specifically denies SOB, chest pain, palpitations, cough, edema, dizziness, weight gain or difficulty sleeping. She says that prior to her admission, she was feeling very well and was talking on the phone and "all of a sudden", she because so SOB that it scared him.    Active at home in her garden and walking to the mailbox daily. She says that the walk up and back to the mail box is ~ 1/2 mile and she's not having any issues with doing this.   ROS: All systems negative except as listed in HPI, PMH and Problem List.  SH:  Social History   Socioeconomic History   Marital status: Legally Separated    Spouse name: Not on file   Number of children: Not on file   Years of education: Not on file   Highest education level: Not on file  Occupational History   Not on file  Tobacco Use   Smoking status: Former    Passive exposure: Never   Smokeless tobacco: Never  Vaping Use   Vaping status: Never Used  Substance and Sexual Activity   Alcohol use: No    Alcohol/week: 0.0 standard drinks of alcohol   Drug use: No   Sexual activity: Not on file  Other Topics Concern   Not on file  Social History Narrative   Not on  file   Social Determinants of Health   Financial Resource Strain: Low Risk  (06/03/2022)   Overall Financial Resource Strain (CARDIA)    Difficulty of Paying Living Expenses: Not hard at all  Food Insecurity: No Food Insecurity (11/01/2022)   Hunger Vital Sign    Worried About Running Out of Food in the Last Year: Never true    Ran Out of Food in the Last Year: Never true  Transportation Needs: No Transportation Needs (11/01/2022)   PRAPARE - Administrator, Civil Service (Medical): No    Lack of Transportation (Non-Medical): No  Physical Activity: Inactive (06/03/2022)   Exercise Vital Sign    Days of Exercise per Week: 0 days    Minutes of Exercise per Session: 0 min  Stress: No Stress Concern Present (06/03/2022)   [employer] of Occupational Health - Occupational Stress Questionnaire    Feeling of Stress : Not at all  Social Connections: Moderately Integrated (06/03/2022)   Social Connection and Isolation Panel [NHANES]    Frequency of Communication with Friends and Family: More than three times a week    Frequency of Social Gatherings with Friends and Family: More than three times a week    Attends Religious Services: More than 4 times per year    Active Member of [employer] or Organizations: Yes  Attends Banker Meetings: More than 4 times per year    Marital Status: Divorced  Intimate Partner Violence: Not At Risk (10/29/2022)   Humiliation, Afraid, Rape, and Kick questionnaire    Fear of Current or Ex-Partner: No    Emotionally Abused: No    Physically Abused: No    Sexually Abused: No    FH:  Family History  Problem Relation Age of Onset   Coronary artery disease Other     Past Medical History:  Diagnosis Date   CAD (coronary artery disease)    stress test 08-15-08 showing lateral scar but no reversible ischemia    CHF (congestive heart failure) (HCC)    Hyperlipidemia    Hypertension    Myocardial infarction [facility])    Renal insufficiency      Current Outpatient Medications  Medication Sig Dispense Refill   aspirin EC 81 MG tablet Take 81 mg by mouth at bedtime.     Cranberry-Vitamin C-Probiotic (AZO CRANBERRY PO) Take 1 capsule by mouth daily at 6 (six) AM.     dapagliflozin propanediol (FARXIGA) 10 MG TABS tablet Take 1 tablet (10 mg total) by mouth daily before breakfast. 30 tablet 6   furosemide (LASIX) 20 MG tablet Take 1 tablet (20 mg total) by mouth daily. 30 tablet 11   omeprazole (PRILOSEC) 40 MG capsule TAKE 1 CAPSULE(40 MG) BY MOUTH DAILY 30 capsule 11   pravastatin (PRAVACHOL) 40 MG tablet Take 1 tablet (40 mg total) by mouth daily. 30 tablet 11   valsartan (DIOVAN) 160 MG tablet Take 1 tablet (160 mg total) by mouth daily. 30 tablet 5   No current facility-administered medications for this visit.   Vitals:   11/12/22 1030  BP: (!) 156/95  Pulse: 87  SpO2: 100%  Weight: 139 lb 12.8 oz (63.4 kg)   Wt Readings from Last 3 Encounters:  11/12/22 139 lb 12.8 oz (63.4 kg)  11/07/22 139 lb 3.2 oz (63.1 kg)  10/28/22 144 lb (65.3 kg)   Lab Results  Component Value Date   CREATININE 1.27 (H) 10/29/2022   CREATININE 1.30 (H) 10/28/2022   CREATININE 1.32 (H) 10/02/2022   EKG: not done  PHYSICAL EXAM:  General:  Well appearing. No resp difficulty HEENT: normal Neck: supple. JVP flat. No lymphadenopathy or thryomegaly appreciated. Cor: PMI normal. Regular rate & rhythm. No rubs, gallops or murmurs. Lungs: clear Abdomen: soft, nontender, nondistended. No hepatosplenomegaly. No bruits or masses.  Extremities: no cyanosis, clubbing, rash. Trace pitting edema bilateral lower legs Neuro: alert & oriented x3, cranial nerves grossly intact. Moves all 4 extremities w/o difficulty. Affect pleasant.   ASSESSMENT & PLAN:  1: NICM with reduced ejection fraction- - etiology likely HTN - NYHA class I - euvolemic - weighing daily; reminded to call for overnight weight gain of > 2 pounds or a weekly weight gain of >  5 pounds - weight down 5 pounds from last visit here 6 weeks ago - Echo 06/23/2018: EF 40-45% along with elevated mean left atrial pressure - Echo 08/08/22: EF 25-30% along with mild LAE and moderate/ severe MR.  - adds salt "on occasion" to foods like potatoes; also likes to eat at [employer] and eat a sausage biscuit; is aware of high sodium content but plans to continue eating there "often" - continue furosemide 20mg  daily - continue valsartan to 160mg  daily  - continue farxiga 10mg  daily - begin metoprolol succinate 25mg  daily (she reminds me that she would be inconsistent with any  meds that would be BID) - discussed adding spironolactone at next visit if BP tolerates - walks to her mailbox and back daily which is 1/2 mile total along with working in her yard - discussed referral to cardiologist but she would like to wait on this for now; saw a cardiologist at [facility] on [address] ~ 2010 - BNP 10/28/22 was 926.5  2: HTN w/ CKD- - BP 156/95 - saw PCP Clent Ridges) 07/24 - BMP 10/29/22 showed sodium 138, potassium 3.9, creatinine 1.27 and GFR 41  3: Carotid stenosis- - right ICA stenosis measuring 40 to 59%, left without stenosis.  - saw vascular ([place]) 02/24 - AAA 3.2 cm  Return in 1 month, sooner if needed.

## 2022-11-12 NOTE — Patient Instructions (Signed)
Start metoprolol as 1 tablet every morning.

## 2022-12-10 ENCOUNTER — Ambulatory Visit: Payer: Self-pay

## 2022-12-10 NOTE — Patient Outreach (Signed)
  Care Coordination   Follow Up Visit Note   12/10/2022 Name: Kathy Becker MRN: [id number] DOB: 13-Oct-1935  Kathy Becker is a 87 y.o. year old female who sees Kathy Salisbury, MD for primary care. I spoke with  Kathy Becker by phone today.  What matters to the patients health and wellness today?  Heart failure maintenance    Goals Addressed             This Visit's Progress    Heart Failure Management       Patient Goals/Self Care Activities: -Patient/Caregiver will self-administer medications as prescribed as evidenced by self-report/primary caregiver report  -Patient/Caregiver will attend all scheduled provider appointments as evidenced by clinician review of documented attendance to scheduled appointments and patient/caregiver report -Patient/Caregiver will call provider office for new concerns or questions as evidenced by review of documented incoming telephone call notes and patient report  -Weigh daily and record (notify MD with 3 lb weight gain over night or 5 lb in a week) -Adhere to low sodium diet   Patient reports doing okay. Patient weight 140 to 141 lbs   Reviewed heart failure and importance of limiting salt intake.  Cardiology appointment coming up.  No concerns.         SDOH assessments and interventions completed:  Yes     Care Coordination Interventions:  Yes, provided   Follow up plan: Follow up call scheduled for October    Encounter Outcome:  Pt. Visit Completed   Kathy Leriche, RN, MSN [facility] Care Management Care Management Coordinator Direct Line [phone]

## 2022-12-10 NOTE — Patient Instructions (Signed)
Visit Information  Thank you for taking time to visit with me today. Please don't hesitate to contact me if I can be of assistance to you.   Following are the goals we discussed today:   Goals Addressed             This Visit's Progress    Heart Failure Management       Patient Goals/Self Care Activities: -Patient/Caregiver will self-administer medications as prescribed as evidenced by self-report/primary caregiver report  -Patient/Caregiver will attend all scheduled provider appointments as evidenced by clinician review of documented attendance to scheduled appointments and patient/caregiver report -Patient/Caregiver will call provider office for new concerns or questions as evidenced by review of documented incoming telephone call notes and patient report  -Weigh daily and record (notify MD with 3 lb weight gain over night or 5 lb in a week) -Adhere to low sodium diet   Patient reports doing okay. Patient weight 140 to 141 lbs   Reviewed heart failure and importance of limiting salt intake.  Cardiology appointment coming up.  No concerns.         Our next appointment is by telephone on 02/04/23 at 1100 am  Please call the care guide team at [phone] if you need to cancel or reschedule your appointment.   If you are experiencing a Mental Health or Behavioral Health Crisis or need someone to talk to, please call the Suicide and Crisis Lifeline: 988   Patient verbalizes understanding of instructions and care plan provided today and agrees to view in MyChart. Active MyChart status and patient understanding of how to access instructions and care plan via MyChart confirmed with patient.     The patient has been provided with contact information for the care management team and has been advised to call with any health related questions or concerns.   Bary Leriche, RN, MSN [facility] Care Management Care Management Coordinator Direct Line [phone]

## 2022-12-16 NOTE — Progress Notes (Unsigned)
PCP: Gershon Crane, MD (last seen 07/24) Primary Cardiologist: None  HPI:  Ms Laramore is a 87 y/o female with a history of  CAD (MI), hyperlipidemia, HTN, CKD, tobacco use and chronic heart failure.   Was in the ED 06/28/22 due to acute on chronic heart failure. IV lasix given w/ improvement of symptoms. Admitted 10/28/22 due to acute onset of worsening dyspnea as well as lower extremity edema. IV diuresed and transitioned to oral diuretics with improvement of symptoms.   Echo 06/23/2018: EF 40-45% along with elevated mean left atrial pressure Echo 08/08/22: EF 25-30% along with mild LAE and moderate/ severe MR.   She presents today for a HF f/u visit with a chief complaint of minimal SOB with moderate exertion. Chronic in nature. Has associated minimal fatigue along with this. Denies chest pain, cough, palpitations, abdominal distention, pedal edema, dizziness or difficulty sleeping.    Goes to [employer] daily and roundtrip is 1/2 mile.   At last visit, metoprolol succinate 25mg  was started & she denies having any issues with it that she's aware.   ROS: All systems negative except as listed in HPI, PMH and Problem List.  SH:  Social History   Socioeconomic History   Marital status: Legally Separated    Spouse name: Not on file   Number of children: Not on file   Years of education: Not on file   Highest education level: Not on file  Occupational History   Not on file  Tobacco Use   Smoking status: Former    Passive exposure: Never   Smokeless tobacco: Never  Vaping Use   Vaping status: Never Used  Substance and Sexual Activity   Alcohol use: No    Alcohol/week: 0.0 standard drinks of alcohol   Drug use: No   Sexual activity: Not on file  Other Topics Concern   Not on file  Social History Narrative   Not on file   Social Determinants of Health   Financial Resource Strain: Low Risk  (06/03/2022)   Overall Financial Resource Strain (CARDIA)    Difficulty of Paying Living  Expenses: Not hard at all  Food Insecurity: No Food Insecurity (11/01/2022)   Hunger Vital Sign    Worried About Running Out of Food in the Last Year: Never true    Ran Out of Food in the Last Year: Never true  Transportation Needs: No Transportation Needs (11/01/2022)   PRAPARE - Administrator, Civil Service (Medical): No    Lack of Transportation (Non-Medical): No  Physical Activity: Inactive (06/03/2022)   Exercise Vital Sign    Days of Exercise per Week: 0 days    Minutes of Exercise per Session: 0 min  Stress: No Stress Concern Present (06/03/2022)   [employer] of Occupational Health - Occupational Stress Questionnaire    Feeling of Stress : Not at all  Social Connections: Moderately Integrated (06/03/2022)   Social Connection and Isolation Panel [NHANES]    Frequency of Communication with Friends and Family: More than three times a week    Frequency of Social Gatherings with Friends and Family: More than three times a week    Attends Religious Services: More than 4 times per year    Active Member of [employer] or Organizations: Yes    Attends Banker Meetings: More than 4 times per year    Marital Status: Divorced  Intimate Partner Violence: Not At Risk (10/29/2022)   Humiliation, Afraid, Rape, and Kick questionnaire  Fear of Current or Ex-Partner: No    Emotionally Abused: No    Physically Abused: No    Sexually Abused: No    FH:  Family History  Problem Relation Age of Onset   Coronary artery disease Other     Past Medical History:  Diagnosis Date   CAD (coronary artery disease)    stress test 08-15-08 showing lateral scar but no reversible ischemia    CHF (congestive heart failure) (HCC)    Hyperlipidemia    Hypertension    Myocardial infarction [facility])    Renal insufficiency     Current Outpatient Medications  Medication Sig Dispense Refill   aspirin EC 81 MG tablet Take 81 mg by mouth at bedtime.     Cranberry-Vitamin C-Probiotic (AZO  CRANBERRY PO) Take 1 capsule by mouth daily at 6 (six) AM.     dapagliflozin propanediol (FARXIGA) 10 MG TABS tablet Take 1 tablet (10 mg total) by mouth daily before breakfast. 30 tablet 6   furosemide (LASIX) 20 MG tablet Take 1 tablet (20 mg total) by mouth daily. 30 tablet 11   metoprolol succinate (TOPROL-XL) 25 MG 24 hr tablet Take 1 tablet (25 mg total) by mouth daily. Take with or immediately following a meal. 30 tablet 5   omeprazole (PRILOSEC) 40 MG capsule TAKE 1 CAPSULE(40 MG) BY MOUTH DAILY 30 capsule 11   pravastatin (PRAVACHOL) 40 MG tablet Take 1 tablet (40 mg total) by mouth daily. 30 tablet 11   valsartan (DIOVAN) 160 MG tablet Take 1 tablet (160 mg total) by mouth daily. 30 tablet 5   No current facility-administered medications for this visit.   Vitals:   12/17/22 1037  BP: (!) 159/66  Pulse: 67  Resp: 14  SpO2: 97%  Weight: 142 lb (64.4 kg)   Wt Readings from Last 3 Encounters:  12/17/22 142 lb (64.4 kg)  11/12/22 139 lb 12.8 oz (63.4 kg)  11/07/22 139 lb 3.2 oz (63.1 kg)   Lab Results  Component Value Date   CREATININE 1.27 (H) 10/29/2022   CREATININE 1.30 (H) 10/28/2022   CREATININE 1.32 (H) 10/02/2022   EKG: not done  PHYSICAL EXAM:  General:  Well appearing. No resp difficulty HEENT: normal Neck: supple. JVP flat. No lymphadenopathy or thryomegaly appreciated. Cor: PMI normal. Regular rate & rhythm. No rubs, gallops or murmurs. Lungs: clear Abdomen: soft, nontender, nondistended. No hepatosplenomegaly. No bruits or masses.  Extremities: no cyanosis, clubbing, rash or edema Neuro: alert & oriented x3, cranial nerves grossly intact. Moves all 4 extremities w/o difficulty. Affect pleasant.   ASSESSMENT & PLAN:  1: NICM with reduced ejection fraction- - etiology likely HTN - NYHA class II - euvolemic - weighing daily; reminded to call for overnight weight gain of > 2 pounds or a weekly weight gain of > 5 pounds - weight up 3 pounds from last  visit here 1 month ago - Echo 06/23/2018: EF 40-45% along with elevated mean left atrial pressure - Echo 08/08/22: EF 25-30% along with mild LAE and moderate/ severe MR.  - plan to repeat echo after GDMT is optimized - adds salt "on occasion" to foods like potatoes; also likes to eat at [employer] and eat a sausage biscuit; is aware of high sodium content but plans to continue eating there "often" - continue furosemide 20mg  daily - continue valsartan 160mg  daily  - continue farxiga 10mg  daily - continue metoprolol succinate 25mg  daily  - begin spironolactone 25mg  daily - BMP today, 10 days and  then again at next OV - walks to her mailbox and back daily which is 1/2 mile total along with working in her yard - discussed referral to cardiologist but she would like to wait on this for now; saw a cardiologist at [facility] on [address] ~ 2010 - BNP 10/28/22 was 926.5  2: HTN w/ CKD- - BP 159/66; adding spiro 25mg  per above - saw PCP Clent Ridges) 07/24 - BMP 10/29/22 showed sodium 138, potassium 3.9, creatinine 1.27 and GFR 41  3: Carotid stenosis- - right ICA stenosis measuring 40 to 59%, left without stenosis.  - saw vascular (Rhyne) 02/24 - AAA 3.2 cm  Return in 1 month, sooner if needed.

## 2022-12-17 ENCOUNTER — Encounter: Payer: Self-pay | Admitting: Family

## 2022-12-17 ENCOUNTER — Ambulatory Visit: Payer: Medicare Other | Attending: Family | Admitting: Family

## 2022-12-17 ENCOUNTER — Other Ambulatory Visit: Payer: Self-pay

## 2022-12-17 VITALS — BP 159/66 | HR 67 | Resp 14 | Wt 142.0 lb

## 2022-12-17 DIAGNOSIS — I2510 Atherosclerotic heart disease of native coronary artery without angina pectoris: Secondary | ICD-10-CM | POA: Insufficient documentation

## 2022-12-17 DIAGNOSIS — I5022 Chronic systolic (congestive) heart failure: Secondary | ICD-10-CM

## 2022-12-17 DIAGNOSIS — I714 Abdominal aortic aneurysm, without rupture, unspecified: Secondary | ICD-10-CM | POA: Insufficient documentation

## 2022-12-17 DIAGNOSIS — Z79899 Other long term (current) drug therapy: Secondary | ICD-10-CM | POA: Insufficient documentation

## 2022-12-17 DIAGNOSIS — I130 Hypertensive heart and chronic kidney disease with heart failure and stage 1 through stage 4 chronic kidney disease, or unspecified chronic kidney disease: Secondary | ICD-10-CM | POA: Insufficient documentation

## 2022-12-17 DIAGNOSIS — I10 Essential (primary) hypertension: Secondary | ICD-10-CM | POA: Diagnosis not present

## 2022-12-17 DIAGNOSIS — R0602 Shortness of breath: Secondary | ICD-10-CM | POA: Insufficient documentation

## 2022-12-17 DIAGNOSIS — I428 Other cardiomyopathies: Secondary | ICD-10-CM | POA: Insufficient documentation

## 2022-12-17 DIAGNOSIS — Z7984 Long term (current) use of oral hypoglycemic drugs: Secondary | ICD-10-CM | POA: Insufficient documentation

## 2022-12-17 DIAGNOSIS — Z87891 Personal history of nicotine dependence: Secondary | ICD-10-CM | POA: Insufficient documentation

## 2022-12-17 DIAGNOSIS — E785 Hyperlipidemia, unspecified: Secondary | ICD-10-CM | POA: Insufficient documentation

## 2022-12-17 DIAGNOSIS — I252 Old myocardial infarction: Secondary | ICD-10-CM | POA: Insufficient documentation

## 2022-12-17 DIAGNOSIS — I6521 Occlusion and stenosis of right carotid artery: Secondary | ICD-10-CM | POA: Diagnosis not present

## 2022-12-17 DIAGNOSIS — N189 Chronic kidney disease, unspecified: Secondary | ICD-10-CM | POA: Diagnosis not present

## 2022-12-17 MED ORDER — SPIRONOLACTONE 25 MG PO TABS: 25.0000 mg | ORAL_TABLET | Freq: Every day | ORAL | 3 refills | Status: DC

## 2022-12-17 NOTE — Patient Instructions (Signed)
Medication Changes:  Start Spironolactone 25 mg (1 tablet) daily.   Lab Work:  Labs done today, your results will be available in MyChart, we will contact you for abnormal readings.  Please come in 10 days to the medical mall entrance to have you labs completed.   Special Instructions // Education:  Do the following things EVERYDAY: Weigh yourself in the morning before breakfast. Write it down and keep it in a log. Take your medicines as prescribed Eat low salt foods--Limit salt (sodium) to 2000 mg per day.  Stay as active as you can everyday Limit all fluids for the day to less than 2 liters   Follow-Up in: follow up in 1 month.    If you have any questions or concerns before your next appointment please send [place] a message through [place] or call our office at [phone] Monday-Friday 8 am-5 pm.   If you have an urgent need after hours on the weekend please call your Primary Cardiologist or the Advanced [facility] Failure Clinic in [place] at [phone].

## 2022-12-18 ENCOUNTER — Telehealth: Payer: Self-pay

## 2022-12-18 DIAGNOSIS — I5022 Chronic systolic (congestive) heart failure: Secondary | ICD-10-CM

## 2022-12-18 LAB — BASIC METABOLIC PANEL
BUN/Creatinine Ratio: 16 (ref 12–28)
BUN: 25 mg/dL (ref 8–27)
CO2: 17 mmol/L — ABNORMAL LOW (ref 20–29)
Calcium: 9 mg/dL (ref 8.7–10.3)
Chloride: 108 mmol/L — ABNORMAL HIGH (ref 96–106)
Creatinine, Ser: 1.61 mg/dL — ABNORMAL HIGH (ref 0.57–1.00)
Glucose: 117 mg/dL — ABNORMAL HIGH (ref 70–99)
Potassium: 4.7 mmol/L (ref 3.5–5.2)
Sodium: 143 mmol/L (ref 134–144)
eGFR: 31 mL/min/{1.73_m2} — ABNORMAL LOW (ref >59)

## 2022-12-18 NOTE — Telephone Encounter (Signed)
-----   Message from Delma Freeze sent at 12/18/2022  8:23 AM EDT ----- Change of plans: due to labs looking like possibly a little dehydrated, do NOT take anymore lasix. Do NOT start spironolactone yet and recheck BMP on Monday. Will decide on spiro after lab results obtained Monday. Make sure drinking 60-64 oz fluids daily.

## 2022-12-24 ENCOUNTER — Other Ambulatory Visit
Admission: RE | Admit: 2022-12-24 | Discharge: 2022-12-24 | Disposition: A | Discharge status: Home / Self Care | Payer: Medicare Other | Attending: Family | Admitting: Family

## 2022-12-24 DIAGNOSIS — I5022 Chronic systolic (congestive) heart failure: Secondary | ICD-10-CM | POA: Insufficient documentation

## 2022-12-24 LAB — BASIC METABOLIC PANEL
Anion gap: 6 (ref 5–15)
BUN: 22 mg/dL (ref 8–23)
CO2: 17 mmol/L — ABNORMAL LOW (ref 22–32)
Calcium: 8.4 mg/dL — ABNORMAL LOW (ref 8.9–10.3)
Chloride: 116 mmol/L — ABNORMAL HIGH (ref 98–111)
Creatinine, Ser: 1.29 mg/dL — ABNORMAL HIGH (ref 0.44–1.00)
GFR, Estimated: 41 mL/min — ABNORMAL LOW (ref >60)
Glucose, Bld: 119 mg/dL — ABNORMAL HIGH (ref 70–99)
Potassium: 4.5 mmol/L (ref 3.5–5.1)
Sodium: 139 mmol/L (ref 135–145)

## 2022-12-25 ENCOUNTER — Telehealth: Payer: Self-pay

## 2022-12-25 DIAGNOSIS — I5022 Chronic systolic (congestive) heart failure: Secondary | ICD-10-CM

## 2022-12-25 MED ORDER — SPIRONOLACTONE 25 MG PO TABS: 12.5000 mg | ORAL_TABLET | Freq: Every day | ORAL | 3 refills | Status: DC

## 2022-12-25 NOTE — Telephone Encounter (Addendum)
  Spoke with pt regarding [employer] recommendations.  Pt aware, agreeable, and verbalized understanding   Pt to come on 9/4 for repeat lab work.  ----- Message from [employer] ----- Lab work is much better. Begin spironolactone but at 1/2 tablet daily. Recheck BMET in 1 week.

## 2022-12-31 ENCOUNTER — Other Ambulatory Visit
Admission: RE | Admit: 2022-12-31 | Discharge: 2022-12-31 | Disposition: A | Discharge status: Home / Self Care | Payer: Medicare Other | Source: Ambulatory Visit | Attending: Family | Admitting: Family

## 2022-12-31 DIAGNOSIS — I5022 Chronic systolic (congestive) heart failure: Secondary | ICD-10-CM | POA: Insufficient documentation

## 2022-12-31 LAB — BASIC METABOLIC PANEL
Anion gap: 7 (ref 5–15)
BUN: 19 mg/dL (ref 8–23)
CO2: 22 mmol/L (ref 22–32)
Calcium: 8.6 mg/dL — ABNORMAL LOW (ref 8.9–10.3)
Chloride: 112 mmol/L — ABNORMAL HIGH (ref 98–111)
Creatinine, Ser: 1.31 mg/dL — ABNORMAL HIGH (ref 0.44–1.00)
GFR, Estimated: 40 mL/min — ABNORMAL LOW (ref >60)
Glucose, Bld: 118 mg/dL — ABNORMAL HIGH (ref 70–99)
Potassium: 4.2 mmol/L (ref 3.5–5.1)
Sodium: 141 mmol/L (ref 135–145)

## 2023-01-14 ENCOUNTER — Ambulatory Visit: Payer: Medicare Other | Attending: Family | Admitting: Family

## 2023-01-14 ENCOUNTER — Encounter: Payer: Self-pay | Admitting: Family

## 2023-01-14 VITALS — BP 118/70 | HR 75 | Wt 145.0 lb

## 2023-01-14 DIAGNOSIS — I428 Other cardiomyopathies: Secondary | ICD-10-CM | POA: Diagnosis not present

## 2023-01-14 DIAGNOSIS — I5022 Chronic systolic (congestive) heart failure: Secondary | ICD-10-CM | POA: Insufficient documentation

## 2023-01-14 DIAGNOSIS — I6521 Occlusion and stenosis of right carotid artery: Secondary | ICD-10-CM | POA: Diagnosis not present

## 2023-01-14 DIAGNOSIS — I10 Essential (primary) hypertension: Secondary | ICD-10-CM | POA: Diagnosis present

## 2023-01-14 DIAGNOSIS — N189 Chronic kidney disease, unspecified: Secondary | ICD-10-CM | POA: Insufficient documentation

## 2023-01-14 DIAGNOSIS — I2510 Atherosclerotic heart disease of native coronary artery without angina pectoris: Secondary | ICD-10-CM | POA: Diagnosis present

## 2023-01-14 DIAGNOSIS — I252 Old myocardial infarction: Secondary | ICD-10-CM | POA: Diagnosis present

## 2023-01-14 DIAGNOSIS — I130 Hypertensive heart and chronic kidney disease with heart failure and stage 1 through stage 4 chronic kidney disease, or unspecified chronic kidney disease: Secondary | ICD-10-CM | POA: Insufficient documentation

## 2023-01-14 NOTE — Progress Notes (Signed)
PCP: Gershon Crane, MD (last seen 07/24) Primary Cardiologist: None  HPI:  Ms Oum is a 87 y/o female with a history of  CAD (MI), hyperlipidemia, HTN, CKD, tobacco use and chronic heart failure.   Was in the ED 06/28/22 due to acute on chronic heart failure. IV lasix given w/ improvement of symptoms. Admitted 10/28/22 due to acute onset of worsening dyspnea as well as lower extremity edema. IV diuresed and transitioned to oral diuretics with improvement of symptoms.   Echo 06/23/2018: EF 40-45% along with elevated mean left atrial pressure Echo 08/08/22: EF 25-30% along with mild LAE and moderate/ severe MR.   She presents today for a HF f/u visit with a chief complaint of minimal SOB with moderate exertion. Chronic in nature. Has associated pedal edema along with this. Denies fatigue, chest pain, cough, palpitations, abdominal distention, pedal edema, weight gain or difficulty sleeping. She says that when she only took 12.5mg  spironolactone, she said that she "started to feel bad" and when she went up to the 25mg  daily, she started feeling great again. She stopped taking furosemide because she was urinating "all the time"  Goes to mailbox daily and roundtrip is 1/2 mile.   ROS: All systems negative except as listed in HPI, PMH and Problem List.  SH:  Social History   Socioeconomic History   Marital status: Legally Separated    Spouse name: Not on file   Number of children: Not on file   Years of education: Not on file   Highest education level: Not on file  Occupational History   Not on file  Tobacco Use   Smoking status: Former    Passive exposure: Never   Smokeless tobacco: Never  Vaping Use   Vaping status: Never Used  Substance and Sexual Activity   Alcohol use: No    Alcohol/week: 0.0 standard drinks of alcohol   Drug use: No   Sexual activity: Not on file  Other Topics Concern   Not on file  Social History Narrative   Not on file   Social Determinants of Health    Financial Resource Strain: Low Risk  (06/03/2022)   Overall Financial Resource Strain (CARDIA)    Difficulty of Paying Living Expenses: Not hard at all  Food Insecurity: No Food Insecurity (11/01/2022)   Hunger Vital Sign    Worried About Running Out of Food in the Last Year: Never true    Ran Out of Food in the Last Year: Never true  Transportation Needs: No Transportation Needs (11/01/2022)   PRAPARE - Administrator, Civil Service (Medical): No    Lack of Transportation (Non-Medical): No  Physical Activity: Inactive (06/03/2022)   Exercise Vital Sign    Days of Exercise per Week: 0 days    Minutes of Exercise per Session: 0 min  Stress: No Stress Concern Present (06/03/2022)   [employer] of Occupational Health - Occupational Stress Questionnaire    Feeling of Stress : Not at all  Social Connections: Moderately Integrated (06/03/2022)   Social Connection and Isolation Panel [NHANES]    Frequency of Communication with Friends and Family: More than three times a week    Frequency of Social Gatherings with Friends and Family: More than three times a week    Attends Religious Services: More than 4 times per year    Active Member of [employer] or Organizations: Yes    Attends Banker Meetings: More than 4 times per year    Marital  Status: Divorced  Catering manager Violence: Not At Risk (10/29/2022)   Humiliation, Afraid, Rape, and Kick questionnaire    Fear of Current or Ex-Partner: No    Emotionally Abused: No    Physically Abused: No    Sexually Abused: No    FH:  Family History  Problem Relation Age of Onset   Coronary artery disease Other     Past Medical History:  Diagnosis Date   CAD (coronary artery disease)    stress test 08-15-08 showing lateral scar but no reversible ischemia    CHF (congestive heart failure) (HCC)    Hyperlipidemia    Hypertension    Myocardial infarction [facility])    Renal insufficiency     Current Outpatient Medications   Medication Sig Dispense Refill   aspirin EC 81 MG tablet Take 81 mg by mouth at bedtime.     Cranberry-Vitamin C-Probiotic (AZO CRANBERRY PO) Take 1 capsule by mouth daily at 6 (six) AM.     dapagliflozin propanediol (FARXIGA) 10 MG TABS tablet Take 1 tablet (10 mg total) by mouth daily before breakfast. 30 tablet 6   furosemide (LASIX) 20 MG tablet Take 1 tablet (20 mg total) by mouth daily. 30 tablet 11   metoprolol succinate (TOPROL-XL) 25 MG 24 hr tablet Take 1 tablet (25 mg total) by mouth daily. Take with or immediately following a meal. 30 tablet 5   omeprazole (PRILOSEC) 40 MG capsule TAKE 1 CAPSULE(40 MG) BY MOUTH DAILY 30 capsule 11   pravastatin (PRAVACHOL) 40 MG tablet Take 1 tablet (40 mg total) by mouth daily. 30 tablet 11   spironolactone (ALDACTONE) 25 MG tablet Take 0.5 tablets (12.5 mg total) by mouth daily. 45 tablet 3   valsartan (DIOVAN) 160 MG tablet Take 1 tablet (160 mg total) by mouth daily. 30 tablet 5   No current facility-administered medications for this visit.   Vitals:   01/14/23 1021  BP: 118/70  Pulse: 75  SpO2: 98%  Weight: 145 lb (65.8 kg)   Wt Readings from Last 3 Encounters:  01/14/23 145 lb (65.8 kg)  12/17/22 142 lb (64.4 kg)  11/12/22 139 lb 12.8 oz (63.4 kg)   Lab Results  Component Value Date   CREATININE 1.31 (H) 12/31/2022   CREATININE 1.29 (H) 12/24/2022   CREATININE 1.61 (H) 12/17/2022    EKG: not done  PHYSICAL EXAM:  General:  Well appearing. No resp difficulty HEENT: normal Neck: supple. JVP flat. No lymphadenopathy or thryomegaly appreciated. Cor: PMI normal. Regular rate & rhythm. No rubs, gallops or murmurs. Lungs: clear Abdomen: soft, nontender, nondistended. No hepatosplenomegaly. No bruits or masses.  Extremities: no cyanosis, clubbing, rash or edema Neuro: alert & oriented x3, cranial nerves grossly intact. Moves all 4 extremities w/o difficulty. Affect pleasant.   ASSESSMENT & PLAN:  1: NICM with reduced  ejection fraction- - etiology likely HTN - NYHA class II - euvolemic - weighing daily; reminded to call for overnight weight gain of > 2 pounds or a weekly weight gain of > 5 pounds - weight up 3 pounds from last visit here 1 month ago - Echo 06/23/2018: EF 40-45% along with elevated mean left atrial pressure - Echo 08/08/22: EF 25-30% along with mild LAE and moderate/ severe MR.  - plan to repeat echo after GDMT is optimized - adds salt "on occasion" to foods like potatoes; also likes to eat at [employer] and eat a sausage biscuit; is aware of high sodium content but plans to continue eating there "  often" - continue furosemide 20mg  daily PRN - continue valsartan 160mg  daily  - continue farxiga 10mg  daily - continue metoprolol succinate 25mg  daily  - continue spironolactone 25mg  daily; she says that when she reduced it to 12.5mg , she started feeling bad and now since taking 25mg  daily, she feels fine - BMP today - walks to her mailbox and back daily which is 1/2 mile total along with working in her yard - discussed referral to cardiologist but she would like to wait on this for now; saw a cardiologist at [facility] on [address] ~ 2010 - BNP 10/28/22 was 926.5  2: HTN w/ CKD- - BP 118/70 - saw PCP Clent Ridges) 07/24 - BMP 12/31/22 showed sodium 141, potassium 4.2, creatinine 1.31 and GFR 40 - BMP today  3: Carotid stenosis- - right ICA stenosis measuring 40 to 59%, left without stenosis.  - saw vascular ([facility]) 02/24 - AAA 3.2 cm  Return in 2 months, sooner if needed.

## 2023-01-15 ENCOUNTER — Telehealth: Payer: Self-pay

## 2023-01-15 DIAGNOSIS — I5022 Chronic systolic (congestive) heart failure: Secondary | ICD-10-CM

## 2023-01-15 NOTE — Telephone Encounter (Addendum)
Pt aware, agreeable, and verbalized understanding Lab orders placed.   ----- Message from Delma Freeze  ----- Potassium is normal. Kidney function a little higher than 2 weeks ago but within your range. Make sure daily fluid intake is 60-64 oz of whatever fluids you are willing to drink. Recheck BMET in 1 month.

## 2023-01-17 ENCOUNTER — Encounter: Payer: Medicare Other | Admitting: Family

## 2023-01-17 DIAGNOSIS — Z23 Encounter for immunization: Secondary | ICD-10-CM | POA: Diagnosis not present

## 2023-02-04 ENCOUNTER — Ambulatory Visit: Payer: Self-pay

## 2023-02-04 NOTE — Patient Instructions (Signed)
Visit Information  Thank you for taking time to visit with me today. Please don't hesitate to contact me if I can be of assistance to you.   Following are the goals we discussed today:   Goals Addressed             This Visit's Progress    Heart Failure Management       Patient Goals/Self Care Activities: -Patient/Caregiver will self-administer medications as prescribed as evidenced by self-report/primary caregiver report  -Patient/Caregiver will attend all scheduled provider appointments as evidenced by clinician review of documented attendance to scheduled appointments and patient/caregiver report -Patient/Caregiver will call provider office for new concerns or questions as evidenced by review of documented incoming telephone call notes and patient report  -Weigh daily and record (notify MD with 3 lb weight gain over night or 5 lb in a week) -Adhere to low sodium diet   Patient reports doing good. Patient weight 140 to 141 lbs   Reiterated heart failure management and importance of limiting salt intake.  Cardiology saw patient and follow up in 2 months.  No concerns.         Our next appointment is by telephone on 04/08/23 at 1100 am  Please call the care guide team at [phone] if you need to cancel or reschedule your appointment.   If you are experiencing a Mental Health or Behavioral Health Crisis or need someone to talk to, please call the Suicide and Crisis Lifeline: 988   Patient verbalizes understanding of instructions and care plan provided today and agrees to view in MyChart. Active MyChart status and patient understanding of how to access instructions and care plan via MyChart confirmed with patient.     The patient has been provided with contact information for the care management team and has been advised to call with any health related questions or concerns.   Bary Leriche, RN, MSN [facility], [facility] Management  Community Coordinator Direct Dial: [phone]  Fax: [phone] Website: [email or web address]

## 2023-02-04 NOTE — Patient Outreach (Signed)
Care Coordination   Follow Up Visit Note   02/04/2023 Name: Kathy Becker MRN: [id number] DOB: 1935/09/06  Kathy Becker is a 87 y.o. year old female who sees Nelwyn Salisbury, MD for primary care. I spoke with  Kathy Becker by phone today.  What matters to the patients health and wellness today?  Managing heart failure    Goals Addressed             This Visit's Progress    Heart Failure Management       Patient Goals/Self Care Activities: -Patient/Caregiver will self-administer medications as prescribed as evidenced by self-report/primary caregiver report  -Patient/Caregiver will attend all scheduled provider appointments as evidenced by clinician review of documented attendance to scheduled appointments and patient/caregiver report -Patient/Caregiver will call provider office for new concerns or questions as evidenced by review of documented incoming telephone call notes and patient report  -Weigh daily and record (notify MD with 3 lb weight gain over night or 5 lb in a week) -Adhere to low sodium diet   Patient reports doing good. Patient weight 140 to 141 lbs   Reiterated heart failure management and importance of limiting salt intake.  Cardiology saw patient and follow up in 2 months.  No concerns.         SDOH assessments and interventions completed:  Yes  SDOH Interventions Today    Flowsheet Row Most Recent Value  SDOH Interventions   Physical Activity Interventions Intervention Not Indicated  Health Literacy Interventions Intervention Not Indicated        Care Coordination Interventions:  Yes, provided   Follow up plan: Follow up call scheduled for December    Encounter Outcome:  Patient Visit Completed   Bary Leriche, RN, MSN [place]  [facility], [facility] Management Community Coordinator Direct Dial: [phone]  Fax: [phone] Website: [email or web address] '

## 2023-02-13 ENCOUNTER — Other Ambulatory Visit
Admission: RE | Admit: 2023-02-13 | Discharge: 2023-02-13 | Disposition: A | Discharge status: Home / Self Care | Payer: Medicare Other | Attending: Family | Admitting: Family

## 2023-02-13 ENCOUNTER — Telehealth: Payer: Self-pay

## 2023-02-13 DIAGNOSIS — I5022 Chronic systolic (congestive) heart failure: Secondary | ICD-10-CM | POA: Diagnosis not present

## 2023-02-13 LAB — BASIC METABOLIC PANEL
Anion gap: 7 (ref 5–15)
BUN: 17 mg/dL (ref 8–23)
CO2: 23 mmol/L (ref 22–32)
Calcium: 8.8 mg/dL — ABNORMAL LOW (ref 8.9–10.3)
Chloride: 109 mmol/L (ref 98–111)
Creatinine, Ser: 1.4 mg/dL — ABNORMAL HIGH (ref 0.44–1.00)
GFR, Estimated: 37 mL/min — ABNORMAL LOW (ref >60)
Glucose, Bld: 137 mg/dL — ABNORMAL HIGH (ref 70–99)
Potassium: 4 mmol/L (ref 3.5–5.1)
Sodium: 139 mmol/L (ref 135–145)

## 2023-02-13 NOTE — Telephone Encounter (Signed)
-----   Message from Delma Freeze sent at 02/13/2023 11:11 AM EDT ----- Kidney function has improved from last month. Continue drinking ~ 60 ounces daily and continue all your medications at this time.

## 2023-02-19 DIAGNOSIS — Z23 Encounter for immunization: Secondary | ICD-10-CM | POA: Diagnosis not present

## 2023-02-28 DIAGNOSIS — J069 Acute upper respiratory infection, unspecified: Secondary | ICD-10-CM | POA: Diagnosis not present

## 2023-03-11 ENCOUNTER — Ambulatory Visit (INDEPENDENT_AMBULATORY_CARE_PROVIDER_SITE_OTHER): Payer: Medicare Other | Admitting: Family Medicine

## 2023-03-11 ENCOUNTER — Encounter: Payer: Self-pay | Admitting: Family Medicine

## 2023-03-11 VITALS — BP 126/80 | HR 63 | Temp 98.0°F | Wt 140.6 lb

## 2023-03-11 DIAGNOSIS — J40 Bronchitis, not specified as acute or chronic: Secondary | ICD-10-CM | POA: Diagnosis not present

## 2023-03-11 MED ORDER — AZITHROMYCIN 250 MG PO TABS: ORAL_TABLET | ORAL | 0 refills | Status: DC

## 2023-03-11 NOTE — Progress Notes (Signed)
   Subjective:    Patient ID: Kathy Becker, female    DOB: December 04, 1935, 87 y.o.   MRN: [id number]  HPI Here for 2 weeks of PND, chest congestion, and coughing up yellow sputum. No fever or SOB. Taking Mucinex. She saw an urgent care last weekend, and she tested negative for Covid and flu.    Review of Systems  Constitutional: Negative.   HENT:  Positive for congestion and postnasal drip. Negative for ear pain, sinus pressure and sore throat.   Eyes: Negative.   Respiratory:  Positive for cough. Negative for shortness of breath and wheezing.        Objective:   Physical Exam Constitutional:      Appearance: Normal appearance.  HENT:     Right Ear: Tympanic membrane, ear canal and external ear normal.     Left Ear: Tympanic membrane, ear canal and external ear normal.     Nose: Nose normal.     Mouth/Throat:     Pharynx: Oropharynx is clear.  Eyes:     Conjunctiva/sclera: Conjunctivae normal.  Pulmonary:     Effort: Pulmonary effort is normal.     Breath sounds: Normal breath sounds.  Lymphadenopathy:     Cervical: No cervical adenopathy.  Neurological:     Mental Status: She is alert.           Assessment & Plan:  Bronchitis, treat with a Zpack. Gershon Crane, MD

## 2023-03-18 ENCOUNTER — Encounter: Payer: Self-pay | Admitting: Family

## 2023-03-18 ENCOUNTER — Ambulatory Visit: Payer: Medicare Other | Attending: Family | Admitting: Family

## 2023-03-18 VITALS — BP 112/84 | HR 89 | Wt 143.0 lb

## 2023-03-18 DIAGNOSIS — I10 Essential (primary) hypertension: Secondary | ICD-10-CM

## 2023-03-18 DIAGNOSIS — I5022 Chronic systolic (congestive) heart failure: Secondary | ICD-10-CM

## 2023-03-18 DIAGNOSIS — Z87891 Personal history of nicotine dependence: Secondary | ICD-10-CM | POA: Diagnosis not present

## 2023-03-18 DIAGNOSIS — I6521 Occlusion and stenosis of right carotid artery: Secondary | ICD-10-CM | POA: Diagnosis not present

## 2023-03-18 DIAGNOSIS — I2510 Atherosclerotic heart disease of native coronary artery without angina pectoris: Secondary | ICD-10-CM | POA: Insufficient documentation

## 2023-03-18 DIAGNOSIS — I5032 Chronic diastolic (congestive) heart failure: Secondary | ICD-10-CM | POA: Diagnosis not present

## 2023-03-18 DIAGNOSIS — I252 Old myocardial infarction: Secondary | ICD-10-CM | POA: Diagnosis not present

## 2023-03-18 DIAGNOSIS — I428 Other cardiomyopathies: Secondary | ICD-10-CM | POA: Insufficient documentation

## 2023-03-18 DIAGNOSIS — N189 Chronic kidney disease, unspecified: Secondary | ICD-10-CM | POA: Insufficient documentation

## 2023-03-18 DIAGNOSIS — I130 Hypertensive heart and chronic kidney disease with heart failure and stage 1 through stage 4 chronic kidney disease, or unspecified chronic kidney disease: Secondary | ICD-10-CM | POA: Insufficient documentation

## 2023-03-18 MED ORDER — METOPROLOL SUCCINATE ER 25 MG PO TB24: 25.0000 mg | ORAL_TABLET | Freq: Every day | ORAL | 5 refills | Status: DC

## 2023-03-18 NOTE — Progress Notes (Signed)
PCP: Gershon Crane, MD (last seen 11/24) Primary Cardiologist: None  HPI:  [name] is a 87 y/o female with a history of  CAD (MI), hyperlipidemia, HTN, CKD, tobacco use and chronic heart failure.   Was in the ED 06/28/22 due to acute on chronic heart failure. IV lasix given w/ improvement of symptoms. Admitted 10/28/22 due to acute onset of worsening dyspnea as well as lower extremity edema. IV diuresed and transitioned to oral diuretics with improvement of symptoms.   Echo 06/23/2018: EF 40-45% along with elevated mean left atrial pressure Echo 08/08/22: EF 25-30% along with mild LAE and moderate/ severe MR.   She presents today for a HF f/u visit with a chief complaint of minimal shortness of breath with moderate exertion. Chronic in nature. Has associated fatigue (improving), cough and feeling unsteady on her feet at times. Denies chest pain, palpitations, abdominal distention, pedal edema, dizziness, weight gain or difficulty sleeping. Sleeping  well on 1 pillow. Has been out of metoprolol for ~ 1 week as she needs a new RX sent in. Feels like she has recovered from her bronchitis.   Goes to [employer] daily and roundtrip is 1/2 mile.   ROS: All systems negative except as listed in HPI, PMH and Problem List.  SH:  Social History   Socioeconomic History   Marital status: Legally Separated    Spouse name: Not on file   Number of children: Not on file   Years of education: Not on file   Highest education level: 12th grade  Occupational History   Not on file  Tobacco Use   Smoking status: Former    Passive exposure: Never   Smokeless tobacco: Never  Vaping Use   Vaping status: Never Used  Substance and Sexual Activity   Alcohol use: No    Alcohol/week: 0.0 standard drinks of alcohol   Drug use: No   Sexual activity: Not on file  Other Topics Concern   Not on file  Social History Narrative   Not on file   Social Determinants of Health   Financial Resource Strain: Medium Risk  (03/10/2023)   Overall Financial Resource Strain (CARDIA)    Difficulty of Paying Living Expenses: Somewhat hard  Food Insecurity: Food Insecurity Present (03/10/2023)   Hunger Vital Sign    Worried About Running Out of Food in the Last Year: Never true    Ran Out of Food in the Last Year: Sometimes true  Transportation Needs: No Transportation Needs (03/10/2023)   PRAPARE - Administrator, Civil Service (Medical): No    Lack of Transportation (Non-Medical): No  Physical Activity: Insufficiently Active (03/10/2023)   Exercise Vital Sign    Days of Exercise per Week: 5 days    Minutes of Exercise per Session: 10 min  Stress: No Stress Concern Present (03/10/2023)   [employer] of Occupational Health - Occupational Stress Questionnaire    Feeling of Stress : Not at all  Social Connections: Moderately Integrated (03/10/2023)   Social Connection and Isolation Panel [NHANES]    Frequency of Communication with Friends and Family: Three times a week    Frequency of Social Gatherings with Friends and Family: More than three times a week    Attends Religious Services: More than 4 times per year    Active Member of Clubs or Organizations: Yes    Attends Banker Meetings: More than 4 times per year    Marital Status: Widowed  Intimate Partner Violence: Not At  Risk (10/29/2022)   Humiliation, Afraid, Rape, and Kick questionnaire    Fear of Current or Ex-Partner: No    Emotionally Abused: No    Physically Abused: No    Sexually Abused: No    FH:  Family History  Problem Relation Age of Onset   Coronary artery disease Other     Past Medical History:  Diagnosis Date   CAD (coronary artery disease)    stress test 08-15-08 showing lateral scar but no reversible ischemia    CHF (congestive heart failure) (HCC)    Hyperlipidemia    Hypertension    Myocardial infarction [facility])    Renal insufficiency     Current Outpatient Medications  Medication Sig Dispense  Refill   aspirin EC 81 MG tablet Take 81 mg by mouth at bedtime.     dapagliflozin propanediol (FARXIGA) 10 MG TABS tablet Take 1 tablet (10 mg total) by mouth daily before breakfast. 30 tablet 6   omeprazole (PRILOSEC) 40 MG capsule TAKE 1 CAPSULE(40 MG) BY MOUTH DAILY 30 capsule 11   pravastatin (PRAVACHOL) 40 MG tablet Take 1 tablet (40 mg total) by mouth daily. 30 tablet 11   spironolactone (ALDACTONE) 25 MG tablet Take 0.5 tablets (12.5 mg total) by mouth daily. 45 tablet 3   valsartan (DIOVAN) 160 MG tablet Take 1 tablet (160 mg total) by mouth daily. 30 tablet 5   azithromycin (ZITHROMAX Z-PAK) 250 MG tablet As directed (Patient not taking: Reported on 03/18/2023) 6 each 0   Cranberry-Vitamin C-Probiotic (AZO CRANBERRY PO) Take 1 capsule by mouth daily at 6 (six) AM. (Patient not taking: Reported on 03/18/2023)     furosemide (LASIX) 20 MG tablet Take 1 tablet (20 mg total) by mouth daily. (Patient not taking: Reported on 03/18/2023) 30 tablet 11   metoprolol succinate (TOPROL-XL) 25 MG 24 hr tablet Take 1 tablet (25 mg total) by mouth daily. Take with or immediately following a meal. 30 tablet 5   No current facility-administered medications for this visit.   Vitals:   03/18/23 1014  BP: 112/84  Pulse: 89  SpO2: 98%  Weight: 143 lb (64.9 kg)   Wt Readings from Last 3 Encounters:  03/18/23 143 lb (64.9 kg)  03/11/23 140 lb 9.6 oz (63.8 kg)  01/14/23 145 lb (65.8 kg)   Lab Results  Component Value Date   CREATININE 1.40 (H) 02/13/2023   CREATININE 1.51 (H) 01/14/2023   CREATININE 1.31 (H) 12/31/2022   EKG: not done  PHYSICAL EXAM:  General:  Well appearing. No resp difficulty HEENT: normal Neck: supple. JVP flat. No lymphadenopathy or thryomegaly appreciated. Cor: PMI normal. Regular rate & rhythm. No rubs, gallops or murmurs. Lungs: clear Abdomen: soft, nontender, nondistended. No hepatosplenomegaly. No bruits or masses.  Extremities: no cyanosis, clubbing, rash or  edema Neuro: alert & oriented x3, cranial nerves grossly intact. Moves all 4 extremities w/o difficulty. Affect pleasant.   ASSESSMENT & PLAN:  1: NICM with reduced ejection fraction- - etiology likely HTN - NYHA class II - euvolemic - weighing daily; reminded to call for overnight weight gain of > 2 pounds or a weekly weight gain of > 5 pounds - weight down 2 pounds from last visit here 2 months ago - Echo 06/23/2018: EF 40-45% along with elevated mean left atrial pressure - Echo 08/08/22: EF 25-30% along with mild LAE and moderate/ severe MR.  - updated echo currently scheduled on 04/18/23 - depending on results, may need cMRI - adds salt "on occasion"  to foods like potatoes; also likes to eat at [employer] and eat a sausage biscuit; is aware of high sodium content but plans to continue eating there "often" - continue furosemide 20mg  daily PRN - continue valsartan 160mg  daily  - continue farxiga 10mg  daily - continue metoprolol succinate 25mg  daily although she's been out of this for ~ 1 week; will not titrate as she continues to feel unsteady on her feet.  - continue spironolactone 25mg  daily - walks to her mailbox and back daily which is 1/2 mile total along with working in her yard - discussed referral to cardiologist but she would like to wait on this for now; saw a cardiologist at [facility] on [address] ~ 2010 - BNP 10/28/22 was 926.5  2: HTN w/ CKD- - BP 112/84 - saw PCP Clent [facility]ges) 11/24 - BMP 02/13/23 showed sodium 139, potassium 4.0, creatinine 1.4 and GFR 37  3: Carotid stenosis- - right ICA stenosis measuring 40 to 59%, left without stenosis.  - saw vascular ([facility]) 02/24 - AAA 3.2 cm  Return 1 week after echo, sooner if needed.

## 2023-03-18 NOTE — Patient Instructions (Addendum)
Please have your echo completed. You will check in for this at the [facility]. You have to arrive 15 mins early preparation, otherwise you will have tor reschedule.

## 2023-04-08 ENCOUNTER — Ambulatory Visit: Payer: Self-pay

## 2023-04-08 NOTE — Patient Outreach (Signed)
  Care Coordination   Follow Up Visit Note   04/08/2023 Name: Kathy Becker MRN: [id number] DOB: 12/18/1935  Kathy Becker is a 87 y.o. year old female who sees Nelwyn Salisbury, MD for primary care. I spoke with  Kathy Becker by phone today.  What matters to the patients health and wellness today?  Heart failure management    Goals Addressed             This Visit's Progress    Heart Failure Management       Patient Goals/Self Care Activities: -Patient/Caregiver will self-administer medications as prescribed as evidenced by self-report/primary caregiver report  -Patient/Caregiver will attend all scheduled provider appointments as evidenced by clinician review of documented attendance to scheduled appointments and patient/caregiver report -Patient/Caregiver will call provider office for new concerns or questions as evidenced by review of documented incoming telephone call notes and patient report  -Weigh daily and record (notify MD with 3 lb weight gain over night or 5 lb in a week) -Adhere to low sodium diet   Patient reports doing good. Patient weight 140 to 141 lbs   Encouraged heart failure management and importance of limiting salt intake.  Echocardiogram scheduled 12-20.  No RN CM concerns.         SDOH assessments and interventions completed:  Yes  SDOH Interventions Today    Flowsheet Row Most Recent Value  SDOH Interventions   Food Insecurity Interventions Intervention Not Indicated  Housing Interventions Intervention Not Indicated  Utilities Interventions Intervention Not Indicated        Care Coordination Interventions:  Yes, provided   Follow up plan: Follow up call scheduled for February    Encounter Outcome:  Patient Visit Completed   Bary Leriche, RN, MSN RN Care Manager [facility], Population Health Direct Dial: [phone]  Fax: [phone] Website: [email or web address]

## 2023-04-08 NOTE — Patient Instructions (Signed)
Visit Information  Thank you for taking time to visit with me today. Please don't hesitate to contact me if I can be of assistance to you.   Following are the goals we discussed today:   Goals Addressed             This Visit's Progress    Heart Failure Management       Patient Goals/Self Care Activities: -Patient/Caregiver will self-administer medications as prescribed as evidenced by self-report/primary caregiver report  -Patient/Caregiver will attend all scheduled provider appointments as evidenced by clinician review of documented attendance to scheduled appointments and patient/caregiver report -Patient/Caregiver will call provider office for new concerns or questions as evidenced by review of documented incoming telephone call notes and patient report  -Weigh daily and record (notify MD with 3 lb weight gain over night or 5 lb in a week) -Adhere to low sodium diet   Patient reports doing good. Patient weight 140 to 141 lbs   Encouraged heart failure management and importance of limiting salt intake.  Echocardiogram scheduled 12-20.  No RN CM concerns.         Our next appointment is by telephone on 06/10/23 at 1100 am  Please call the care guide team at [phone] if you need to cancel or reschedule your appointment.   If you are experiencing a Mental Health or Behavioral Health Crisis or need someone to talk to, please call the Suicide and Crisis Lifeline: 988   Patient verbalizes understanding of instructions and care plan provided today and agrees to view in MyChart. Active MyChart status and patient understanding of how to access instructions and care plan via MyChart confirmed with patient.     The patient has been provided with contact information for the care management team and has been advised to call with any health related questions or concerns.   Bary Leriche, RN, MSN RN Care Manager [facility], Population Health Direct Dial:  [phone]  Fax: [phone] Website: [email or web address]

## 2023-04-12 ENCOUNTER — Other Ambulatory Visit: Payer: Self-pay | Admitting: Family

## 2023-04-18 ENCOUNTER — Ambulatory Visit
Admission: RE | Admit: 2023-04-18 | Discharge: 2023-04-18 | Disposition: A | Discharge status: Home / Self Care | Payer: Medicare Other | Source: Ambulatory Visit | Attending: Family | Admitting: Family

## 2023-04-18 DIAGNOSIS — I5022 Chronic systolic (congestive) heart failure: Secondary | ICD-10-CM | POA: Insufficient documentation

## 2023-04-18 DIAGNOSIS — I252 Old myocardial infarction: Secondary | ICD-10-CM | POA: Insufficient documentation

## 2023-04-18 DIAGNOSIS — E785 Hyperlipidemia, unspecified: Secondary | ICD-10-CM | POA: Diagnosis not present

## 2023-04-18 DIAGNOSIS — I110 Hypertensive heart disease with heart failure: Secondary | ICD-10-CM | POA: Insufficient documentation

## 2023-04-18 DIAGNOSIS — I2510 Atherosclerotic heart disease of native coronary artery without angina pectoris: Secondary | ICD-10-CM | POA: Insufficient documentation

## 2023-04-18 LAB — ECHOCARDIOGRAM COMPLETE
AR max vel: 1.96 cm2
AV Area VTI: 2.14 cm2
AV Area mean vel: 1.73 cm2
AV Mean grad: 3.5 mm[Hg]
AV Peak grad: 5.8 mm[Hg]
Ao pk vel: 1.21 m/s
Area-P 1/2: 3.28 cm2
Calc EF: 47.6 %
S' Lateral: 4 cm
Single Plane A2C EF: 45.9 %
Single Plane A4C EF: 45 %

## 2023-04-29 ENCOUNTER — Encounter: Payer: Self-pay | Admitting: Family

## 2023-04-29 ENCOUNTER — Ambulatory Visit: Payer: Medicare Other | Attending: Family | Admitting: Family

## 2023-04-29 VITALS — BP 153/77 | HR 73 | Wt 142.0 lb

## 2023-04-29 DIAGNOSIS — Z79899 Other long term (current) drug therapy: Secondary | ICD-10-CM | POA: Insufficient documentation

## 2023-04-29 DIAGNOSIS — N189 Chronic kidney disease, unspecified: Secondary | ICD-10-CM | POA: Diagnosis not present

## 2023-04-29 DIAGNOSIS — I428 Other cardiomyopathies: Secondary | ICD-10-CM | POA: Insufficient documentation

## 2023-04-29 DIAGNOSIS — I5022 Chronic systolic (congestive) heart failure: Secondary | ICD-10-CM | POA: Diagnosis not present

## 2023-04-29 DIAGNOSIS — I714 Abdominal aortic aneurysm, without rupture, unspecified: Secondary | ICD-10-CM | POA: Diagnosis not present

## 2023-04-29 DIAGNOSIS — I130 Hypertensive heart and chronic kidney disease with heart failure and stage 1 through stage 4 chronic kidney disease, or unspecified chronic kidney disease: Secondary | ICD-10-CM | POA: Insufficient documentation

## 2023-04-29 DIAGNOSIS — I2510 Atherosclerotic heart disease of native coronary artery without angina pectoris: Secondary | ICD-10-CM | POA: Diagnosis not present

## 2023-04-29 DIAGNOSIS — E785 Hyperlipidemia, unspecified: Secondary | ICD-10-CM | POA: Insufficient documentation

## 2023-04-29 DIAGNOSIS — Z7984 Long term (current) use of oral hypoglycemic drugs: Secondary | ICD-10-CM | POA: Insufficient documentation

## 2023-04-29 DIAGNOSIS — I6521 Occlusion and stenosis of right carotid artery: Secondary | ICD-10-CM | POA: Diagnosis not present

## 2023-04-29 DIAGNOSIS — I509 Heart failure, unspecified: Secondary | ICD-10-CM | POA: Insufficient documentation

## 2023-04-29 DIAGNOSIS — I10 Essential (primary) hypertension: Secondary | ICD-10-CM | POA: Diagnosis not present

## 2023-04-29 MED ORDER — METOPROLOL SUCCINATE ER 50 MG PO TB24: 50.0000 mg | ORAL_TABLET | Freq: Every day | ORAL | 3 refills | Status: DC

## 2023-04-29 NOTE — Progress Notes (Signed)
 PCP: Johnny Senior, MD (last seen 11/24) Primary Cardiologist: None  Chief Complaint: Fatigue   HPI:  Kathy Becker is a 87 y/o female with a history of  CAD (MI), hyperlipidemia, HTN, CKD, tobacco use and chronic heart failure.   Was in the ED 06/28/22 due to acute on chronic heart failure. IV lasix  given w/ improvement of symptoms. Admitted 10/28/22 due to acute onset of worsening dyspnea as well as lower extremity edema. IV diuresed and transitioned to oral diuretics with improvement of symptoms.   Echo 06/23/2018: EF 40-45% along with elevated mean left atrial pressure Echo 08/08/22: EF 25-30% along with mild LAE and moderate/ severe MR.  Echo 04/18/23: EF 35-40% with Grade I DD  She presents today for a HF f/u visit with a chief complaint of minimal fatigue with moderate exertion. Chronic in nature. Does notice being unsteady or wobbly when she first gets up in the morning. Denies shortness of breath, chest pain, cough, palpitations, abdominal distention, pedal edema, dizziness, weight gain or difficulty sleeping. Reports sleeping well on 1 pillow.   Goes [place] [employer] daily and roundtrip is 1/2 mile. Makes this trip 1-2 times/ day without any difficulties.   Reviewed echo w/ patient today.   ROS: All systems negative except as listed in HPI, PMH and Problem List.  SH:  Social History   Socioeconomic History   Marital status: Legally Separated    Spouse name: Not on file   Number of children: Not on file   Years of education: Not on file   Highest education level: 12th grade  Occupational History   Not on file  Tobacco Use   Smoking status: Former    Passive exposure: Never   Smokeless tobacco: Never  Vaping Use   Vaping status: Never Used  Substance and Sexual Activity   Alcohol use: No    Alcohol/week: 0.0 standard drinks of alcohol   Drug use: No   Sexual activity: Not on file  Other Topics Concern   Not on file  Social History Narrative   Not on file   Social Drivers  of Health   Financial Resource Strain: Medium Risk (03/10/2023)   Overall Financial Resource Strain (CARDIA)    Difficulty of Paying Living Expenses: Somewhat hard  Food Insecurity: No Food Insecurity (04/08/2023)   Hunger Vital Sign    Worried About Running Out of Food in the Last Year: Never true    Ran Out of Food in the Last Year: Never true  Recent Concern: Food Insecurity - Food Insecurity Present (03/10/2023)   Hunger Vital Sign    Worried About Running Out of Food in the Last Year: Never true    Ran Out of Food in the Last Year: Sometimes true  Transportation Needs: No Transportation Needs (03/10/2023)   PRAPARE - Administrator, Civil Service (Medical): No    Lack of Transportation (Non-Medical): No  Physical Activity: Insufficiently Active (03/10/2023)   Exercise Vital Sign    Days of Exercise per Week: 5 days    Minutes of Exercise per Session: 10 min  Stress: No Stress Concern Present (03/10/2023)   [employer] of Occupational Health - Occupational Stress Questionnaire    Feeling of Stress : Not at all  Social Connections: Moderately Integrated (03/10/2023)   Social Connection and Isolation Panel [NHANES]    Frequency of Communication with Friends and Family: Three times a week    Frequency of Social Gatherings with Friends and Family: More than three times  a week    Attends Religious Services: More than 4 times per year    Active Member of Clubs or Organizations: Yes    Attends Banker Meetings: More than 4 times per year    Marital Status: Widowed  Intimate Partner Violence: Not At Risk (04/08/2023)   Humiliation, Afraid, Rape, and Kick questionnaire    Fear of Current or Ex-Partner: No    Emotionally Abused: No    Physically Abused: No    Sexually Abused: No    FH:  Family History  Problem Relation Age of Onset   Coronary artery disease Other     Past Medical History:  Diagnosis Date   CAD (coronary artery disease)     stress test 08-15-08 showing lateral scar but no reversible ischemia    CHF (congestive heart failure) (HCC)    Hyperlipidemia    Hypertension    Myocardial infarction [facility])    Renal insufficiency     Current Outpatient Medications  Medication Sig Dispense Refill   aspirin  EC 81 MG tablet Take 81 mg by mouth at bedtime.     dapagliflozin  propanediol (FARXIGA ) 10 MG TABS tablet Take 1 tablet (10 mg total) by mouth daily before breakfast. 30 tablet 6   furosemide  (LASIX ) 20 MG tablet Take 1 tablet (20 mg total) by mouth daily. (Patient taking differently: Take 20 mg by mouth as needed.) 30 tablet 11   metoprolol  succinate (TOPROL -XL) 25 MG 24 hr tablet Take 1 tablet (25 mg total) by mouth daily. Take with or immediately following a meal. 30 tablet 5   omeprazole  (PRILOSEC) 40 MG capsule TAKE 1 CAPSULE(40 MG) BY MOUTH DAILY 30 capsule 11   pravastatin  (PRAVACHOL ) 40 MG tablet Take 1 tablet (40 mg total) by mouth daily. 30 tablet 11   spironolactone  (ALDACTONE ) 25 MG tablet Take 0.5 tablets (12.5 mg total) by mouth daily. 45 tablet 3   valsartan  (DIOVAN ) 160 MG tablet TAKE 1 TABLET(160 MG) BY MOUTH DAILY 30 tablet 5   No current facility-administered medications for this visit.   Vitals:   04/29/23 1020  BP: (!) 153/77  Pulse: 73  SpO2: 97%  Weight: 142 lb (64.4 kg)   Wt Readings from Last 3 Encounters:  04/29/23 142 lb (64.4 kg)  03/18/23 143 lb (64.9 kg)  03/11/23 140 lb 9.6 oz (63.8 kg)   Lab Results  Component Value Date   CREATININE 1.40 (H) 02/13/2023   CREATININE 1.51 (H) 01/14/2023   CREATININE 1.31 (H) 12/31/2022     EKG: not done   PHYSICAL EXAM:  General:  Well appearing. No resp difficulty HEENT: normal Neck: supple. JVP flat. No lymphadenopathy or thryomegaly appreciated. Cor: PMI normal. Regular rate & rhythm. No rubs, gallops or murmurs. Lungs: clear Abdomen: soft, nontender, nondistended. No hepatosplenomegaly. No bruits or masses.  Extremities: no  cyanosis, clubbing, rash or edema Neuro: alert & oriented x3, cranial nerves grossly intact. Moves all 4 extremities w/o difficulty. Affect pleasant.   ASSESSMENT & PLAN:  1: NICM with reduced ejection fraction- - etiology likely HTN - NYHA class II - euvolemic - weighing daily; reminded to call for overnight weight gain of > 2 pounds or a weekly weight gain of > 5 pounds - weight stable from last visit here 6 weeks ago - Echo 06/23/2018: EF 40-45% along with elevated mean left atrial pressure - Echo 08/08/22: EF 25-30% along with mild LAE and moderate/ severe MR.  - Echo 04/18/23: EF 35-40% with Grade I DD -  adds salt on occasion to foods like potatoes; also likes to eat at [employer] and eat a sausage biscuit; is aware of high sodium content but plans to continue eating there often - continue furosemide  20mg  daily PRN; has not taken this in quite awhile - continue valsartan  160mg  daily  - continue farxiga  10mg  daily - increase metoprolol  succinate to 50mg  daily  - continue spironolactone  25mg  daily - walks to her mailbox and back daily which is 1/2 mile total along with working in her yard - discussed referral to cardiologist but she would like to wait on this for now; saw a cardiologist at [facility] on [address] ~ 2010 - BNP 10/28/22 was 926.5  2: HTN w/ CKD- - BP 153/77; increasing metoprolol  per above - if needs additional agent, will need once daily dosing as she says that BID dosing will not work for her - saw PCP [facility]) 11/24 - BMP 02/13/23 showed sodium 139, potassium 4.0, creatinine 1.4 and GFR 37  3: Carotid stenosis- - right ICA stenosis measuring 40 to 59%, left without stenosis.  - saw vascular ([facility]) 02/24 - AAA 3.2 cm  Return in 4 months, sooner if needed.

## 2023-04-29 NOTE — Patient Instructions (Addendum)
Increase your Metoprolol to 50 MG  You can do this by finishing your current prescription by taking 2 pills daily until you run out  Then, start your new prescription by taking 1 tablet daily

## 2023-05-01 ENCOUNTER — Telehealth: Payer: Self-pay | Admitting: Family Medicine

## 2023-05-01 NOTE — Telephone Encounter (Signed)
 Copied from CRM [id number]. Topic: Appointments - Appointment Info/Confirmation >> May 01, 2023 11:08 AM Rosina BIRCH wrote: Patient/patient representative is calling for information regarding an appointment. Pt called wanting to know about an appointment on 2/14 about a wellness visit. Pt stated she did not make that appointment and she does not appreciate someone making that appointment for her because she has bible study on Fridays. Pt stated she does not want anyone to make an appointment for her, she will call and make an appointment. Pt stated she was pissed

## 2023-05-02 NOTE — Telephone Encounter (Signed)
 Spoke with patient regarding her 06/13/23 appt  I let patient know appt was made last year 2024 when she spoke with beverly.  Also let patient know this was schedule as phone appt.   Patient stated when she call no one told her is was a phone appt.   Patient was fine with appt

## 2023-05-21 ENCOUNTER — Telehealth: Payer: Self-pay

## 2023-05-21 ENCOUNTER — Other Ambulatory Visit (HOSPITAL_COMMUNITY): Payer: Self-pay

## 2023-05-21 ENCOUNTER — Other Ambulatory Visit: Payer: Self-pay | Admitting: Family

## 2023-05-21 ENCOUNTER — Telehealth: Payer: Self-pay | Admitting: Family

## 2023-05-21 ENCOUNTER — Other Ambulatory Visit: Payer: Self-pay

## 2023-05-21 MED ORDER — SPIRONOLACTONE 25 MG PO TABS: 25.0000 mg | ORAL_TABLET | Freq: Every day | ORAL | 3 refills | Status: DC

## 2023-05-21 NOTE — Telephone Encounter (Signed)
Advanced Heart Failure Patient Advocate Encounter  Returned call from pharmacy, they are having rejection errors when trying to bill [employer]. Unable to resolve issues, I have obtained a [employer] grant for the patient instead.  The patient was approved for a [employer] grant that will help cover the cost of Farxiga, Metoprolol, Spironolactone, Valsartan.  Total amount awarded, $10,000.  Effective: 04/21/2023 - 04/19/2024.  BIN [id number] PCN [id number] [id number] ID [id number]  Pharmacy provided with approval and processing information. Patient informed via phone.  Burnell Blanks, CPhT Rx Patient Advocate Phone: [phone]

## 2023-06-10 ENCOUNTER — Ambulatory Visit: Payer: Self-pay

## 2023-06-10 NOTE — Patient Instructions (Signed)
Visit Information  Thank you for taking time to visit with me today. Please don't hesitate to contact me if I can be of assistance to you.   Following are the goals we discussed today:   Goals Addressed             This Visit's Progress    Heart Failure Management       Patient Goals/Self Care Activities: -Patient/Caregiver will self-administer medications as prescribed as evidenced by self-report/primary caregiver report  -Patient/Caregiver will attend all scheduled provider appointments as evidenced by clinician review of documented attendance to scheduled appointments and patient/caregiver report -Patient/Caregiver will call provider office for new concerns or questions as evidenced by review of documented incoming telephone call notes and patient report  -Weigh daily and record (notify MD with 3 lb weight gain over night or 5 lb in a week) -Adhere to low sodium diet   Patient reports doing good. Patient weight 140 to 143 lbs   Reiterated heart failure management and importance of limiting salt intake.  No RN CM concerns.         Our next appointment is by telephone on 08/05/23 at 1030 am  Please call the care guide team at [phone] if you need to cancel or reschedule your appointment.   If you are experiencing a Mental Health or Behavioral Health Crisis or need someone to talk to, please call the Suicide and Crisis Lifeline: 988   Patient verbalizes understanding of instructions and care plan provided today and agrees to view in MyChart. Active MyChart status and patient understanding of how to access instructions and care plan via MyChart confirmed with patient.     The patient has been provided with contact information for the care management team and has been advised to call with any health related questions or concerns.   Bary Leriche RN, MSN [facility] RN Care Manager Direct Dial: [phone]  Fax:  [phone] Website: [email or web address]

## 2023-06-10 NOTE — Patient Outreach (Signed)
  Care Coordination   Follow Up Visit Note   06/10/2023 Name: Kathy Becker MRN: [id number] DOB: 1935-07-01  Kathy Becker is a 88 y.o. year old female who sees Nelwyn Salisbury, MD for primary care. I spoke with  Kathyrn Sheriff Sager by phone today.  What matters to the patients health and wellness today?  Heart failure management    Goals Addressed             This Visit's Progress    Heart Failure Management       Patient Goals/Self Care Activities: -Patient/Caregiver will self-administer medications as prescribed as evidenced by self-report/primary caregiver report  -Patient/Caregiver will attend all scheduled provider appointments as evidenced by clinician review of documented attendance to scheduled appointments and patient/caregiver report -Patient/Caregiver will call provider office for new concerns or questions as evidenced by review of documented incoming telephone call notes and patient report  -Weigh daily and record (notify MD with 3 lb weight gain over night or 5 lb in a week) -Adhere to low sodium diet   Patient reports doing good. Patient weight 140 to 143 lbs   Reiterated heart failure management and importance of limiting salt intake.  No RN CM concerns.         SDOH assessments and interventions completed:  Yes     Care Coordination Interventions:  Yes, provided   Follow up plan: Follow up call scheduled for April    Encounter Outcome:  Patient Visit Completed   Bary Leriche RN, MSN [facility], [facility] RN Care Manager Direct Dial: [phone]  Fax: [phone] Website: [email or web address]

## 2023-06-13 ENCOUNTER — Ambulatory Visit: Payer: Medicare Other

## 2023-06-13 VITALS — Ht 60.0 in | Wt 142.0 lb

## 2023-06-13 DIAGNOSIS — Z0000 Encounter for general adult medical examination without abnormal findings: Secondary | ICD-10-CM | POA: Diagnosis not present

## 2023-06-13 NOTE — Patient Instructions (Addendum)
Ms. Oshea , Thank you for taking time to come for your Medicare Wellness Visit. I appreciate your ongoing commitment to your health goals. Please review the following plan we discussed and let me know if I can assist you in the future.   Referrals/Orders/Follow-Ups/Clinician Recommendations:   This is a list of the screening recommended for you and due dates:  Health Maintenance  Topic Date Due   Zoster (Shingles) Vaccine (1 of 2) 04/30/1987   DEXA scan (bone density measurement)  Never done   COVID-19 Vaccine (4 - 2024-25 season) 12/29/2022   Medicare Annual Wellness Visit  06/12/2024   Pneumonia Vaccine  Completed   Flu Shot  Completed   HPV Vaccine  Aged Out   DTaP/Tdap/Td vaccine  Discontinued    Advanced directives: (Copy Requested) Please bring a copy of your health care power of attorney and living will to the office to be added to your chart at your convenience.  Next Medicare Annual Wellness Visit scheduled for next year: Yes

## 2023-06-13 NOTE — Progress Notes (Signed)
Subjective:   Kathy Becker is a 88 y.o. female who presents for Medicare Annual (Subsequent) preventive examination.  Visit Complete: Virtual I connected with  Kathy Becker on 06/13/23 by a audio enabled telemedicine application and verified that I am speaking with the correct person using two identifiers.  Patient Location: Home  Provider Location: Home Office  I discussed the limitations of evaluation and management by telemedicine. The patient expressed understanding and agreed to proceed.  Vital Signs: Because this visit was a virtual/telehealth visit, some criteria may be missing or patient reported. Any vitals not documented were not able to be obtained and vitals that have been documented are patient reported.  Patient Medicare AWV questionnaire was completed by the patient on 06/09/23; I have confirmed that all information answered by patient is correct and no changes since this date.  Cardiac Risk Factors include: advanced age (>20men, >41 women);hypertension;dyslipidemia     Objective:    Today's Vitals   06/13/23 1546 06/13/23 1548  Weight: 142 lb (64.4 kg)   Height: 5' (1.524 m)   PainSc:  0-No pain   Body mass index is 27.73 kg/m.     06/13/2023    3:55 PM 10/28/2022    9:17 PM 06/28/2022   10:25 AM 06/03/2022    2:36 PM 05/31/2021    1:23 PM 09/14/2018   10:37 AM 08/13/2018    8:51 AM  Advanced Directives  Does Patient Have a Medical Advance Directive? Yes No No Yes Yes No Yes  Type of Estate agent of [place];Living will   Healthcare Power of [place];Living will Healthcare Power of [place];Living will  Healthcare Power of [place];Living will  Does patient want to make changes to medical advance directive?     No - Patient declined  No - Patient declined  Copy of Healthcare Power of Attorney in Chart? No - copy requested   No - copy requested No - copy requested    Would patient like information on creating a medical advance directive?  No  - Patient declined No - Patient declined   No - Patient declined     Current Medications (verified) Outpatient Encounter Medications as of 06/13/2023  Medication Sig   aspirin EC 81 MG tablet Take 81 mg by mouth at bedtime.   FARXIGA 10 MG TABS tablet TAKE 1 TABLET(10 MG) BY MOUTH DAILY BEFORE BREAKFAST   furosemide (LASIX) 20 MG tablet Take 1 tablet (20 mg total) by mouth daily. (Patient taking differently: Take 20 mg by mouth as needed.)   metoprolol succinate (TOPROL-XL) 50 MG 24 hr tablet Take 1 tablet (50 mg total) by mouth daily. Take with or immediately following a meal.   omeprazole (PRILOSEC) 40 MG capsule TAKE 1 CAPSULE(40 MG) BY MOUTH DAILY   pravastatin (PRAVACHOL) 40 MG tablet Take 1 tablet (40 mg total) by mouth daily.   spironolactone (ALDACTONE) 25 MG tablet Take 1 tablet (25 mg total) by mouth daily.   valsartan (DIOVAN) 160 MG tablet TAKE 1 TABLET(160 MG) BY MOUTH DAILY   No facility-administered encounter medications on file as of 06/13/2023.    Allergies (verified) Jardiance [empagliflozin] and Sulfonamide derivatives   History: Past Medical History:  Diagnosis Date   CAD (coronary artery disease)    stress test 08-15-08 showing lateral scar but no reversible ischemia    CHF (congestive heart failure) (HCC)    Hyperlipidemia    Hypertension    Myocardial infarction [facility])    Renal insufficiency  Past Surgical History:  Procedure Laterality Date   BACK SURGERY  1988   lumbar spine    CATARACT EXTRACTION, BILATERAL     per Dr. Dione Booze    COLONOSCOPY  never   she refuses to ever get one    DILATION AND CURETTAGE OF UTERUS     ENDARTERECTOMY Left 07/20/2018   Procedure: ENDARTERECTOMY CAROTID LEFT;  Surgeon: Sherren Kerns, MD;  Location: [facility] OR;  Service: Vascular;  Laterality: Left;   PATCH ANGIOPLASTY Left 07/20/2018   Procedure: PATCH ANGIOPLASTY WITH HEMASHIELD PLATINUM FINESSE CARDIOVASCULAR PATCH;  Surgeon: Sherren Kerns, MD;  Location: [facility] OR;   Service: Vascular;  Laterality: Left;   TONSILLECTOMY     Family History  Problem Relation Age of Onset   Coronary artery disease Other    Social History   Socioeconomic History   Marital status: Legally Separated    Spouse name: Not on file   Number of children: Not on file   Years of education: Not on file   Highest education level: 12th grade  Occupational History   Not on file  Tobacco Use   Smoking status: Former    Passive exposure: Never   Smokeless tobacco: Never  Vaping Use   Vaping status: Never Used  Substance and Sexual Activity   Alcohol use: No    Alcohol/week: 0.0 standard drinks of alcohol   Drug use: No   Sexual activity: Not on file  Other Topics Concern   Not on file  Social History Narrative   Not on file   Social Drivers of Health   Financial Resource Strain: Low Risk  (06/13/2023)   Overall Financial Resource Strain (CARDIA)    Difficulty of Paying Living Expenses: Not hard at all  Food Insecurity: No Food Insecurity (06/13/2023)   Hunger Vital Sign    Worried About Running Out of Food in the Last Year: Never true    Ran Out of Food in the Last Year: Never true  Transportation Needs: No Transportation Needs (06/13/2023)   PRAPARE - Administrator, Civil Service (Medical): No    Lack of Transportation (Non-Medical): No  Physical Activity: Inactive (06/13/2023)   Exercise Vital Sign    Days of Exercise per Week: 0 days    Minutes of Exercise per Session: 0 min  Stress: No Stress Concern Present (06/13/2023)   [employer] of Occupational Health - Occupational Stress Questionnaire    Feeling of Stress : Not at all  Social Connections: Moderately Integrated (06/13/2023)   Social Connection and Isolation Panel [NHANES]    Frequency of Communication with Friends and Family: More than three times a week    Frequency of Social Gatherings with Friends and Family: More than three times a week    Attends Religious Services: More than 4  times per year    Active Member of [employer] or Organizations: Yes    Attends Banker Meetings: More than 4 times per year    Marital Status: Widowed    Tobacco Counseling Counseling given: Not Answered   Clinical Intake:  Pre-visit preparation completed: Yes  Pain : No/denies pain Pain Score: 0-No pain     BMI - recorded: 27.73 Nutritional Status: BMI 25 -29 Overweight Nutritional Risks: None Diabetes: No  How often do you need to have someone help you when you read instructions, pamphlets, or other written materials from your doctor or pharmacy?: 1 - Never  Interpreter Needed?: No  Information entered by ::  Theresa Mulligan LPN   Activities of Daily Living    06/13/2023    3:53 PM 06/09/2023    2:32 PM  In your present state of health, do you have any difficulty performing the following activities:  Hearing? 0 0  Vision? 0 0  Difficulty concentrating or making decisions? 0   Walking or climbing stairs? 0 0  Dressing or bathing? 0 0  Doing errands, shopping? 0 0  Preparing Food and eating ? N N  Using the Toilet? N N  In the past six months, have you accidently leaked urine? Malvin Johns  Comment Wears Pads and Breifs. Followed by PCP   Do you have problems with loss of bowel control? N N  Managing your Medications? N N  Managing your Finances? N N  Housekeeping or managing your Housekeeping? N N    Patient Care Team: Nelwyn Salisbury, MD as PCP - General Fleeta Emmer, RN as [facility] Care Management  Indicate any recent Medical Services you may have received from other than [facility] providers in the past year (date may be approximate).     Assessment:   This is a routine wellness examination for [place].  Hearing/Vision screen Hearing Screening - Comments:: Denies hearing difficulties   Vision Screening - Comments:: Wears rx glasses - up to date with routine eye exams with  [facility]   Goals Addressed               This Visit's  Progress     Stay Active (pt-stated)         Depression Screen    06/13/2023    3:52 PM 04/08/2023   11:07 AM 10/15/2022   11:07 AM 06/03/2022    2:32 PM 07/02/2021    9:26 AM 05/31/2021    1:16 PM 06/22/2018    8:59 AM  PHQ 2/9 Scores  PHQ - 2 Score 0 0 0 0 0 0 1  PHQ- 9 Score     0      Fall Risk    06/13/2023    3:55 PM 06/10/2023   10:59 AM 06/09/2023    2:32 PM 04/08/2023   11:06 AM 03/10/2023   11:18 AM  Fall Risk   Falls in the past year? 1 0 1 0 0  Number falls in past yr: 0  0    Injury with Fall? 0  0    Risk for fall due to : No Fall Risks      Follow up Falls prevention discussed;Falls evaluation completed        MEDICARE RISK AT HOME: Medicare Risk at Home Any stairs in or around the home?: Yes If so, are there any without handrails?: Yes Home free of loose throw rugs in walkways, pet beds, electrical cords, etc?: Yes Adequate lighting in your home to reduce risk of falls?: Yes Life alert?: No Use of a cane, walker or w/c?: No Grab bars in the bathroom?: Yes Shower chair or bench in shower?: No Elevated toilet seat or a handicapped toilet?: No  TIMED UP AND GO:  Was the test performed?  No    Cognitive Function:    06/20/2017    9:54 AM  MMSE - Mini Mental State Exam  Not completed: --        06/13/2023    3:56 PM 06/03/2022    2:36 PM 05/31/2021    1:22 PM  6CIT Screen  What Year? 0 points 0 points 0 points  What month? 0 points 0 points 0 points  What time? 0 points 0 points 0 points  Count back from 20 0 points 0 points 0 points  Months in reverse 0 points 0 points 0 points  Repeat phrase 0 points 0 points 0 points  Total Score 0 points 0 points 0 points    Immunizations Immunization History  Administered Date(s) Administered   Influenza Split 02/10/2013   Influenza, High Dose Seasonal PF 12/20/2016, 01/21/2018   Influenza-Unspecified 01/27/2014, 02/16/2021   PFIZER Comirnaty(Gray Top)Covid-19 Tri-Sucrose Vaccine 06/03/2019, 06/24/2019,  02/25/2020   Pneumococcal Conjugate-13 06/20/2015   Pneumococcal Polysaccharide-23 06/20/2017   Zoster, Live 03/26/2011      Flu Vaccine status: Up to date  Pneumococcal vaccine status: Up to date  Covid-19 vaccine status: Declined, Education has been provided regarding the importance of this vaccine but patient still declined. Advised may receive this vaccine at local pharmacy or Health Dept.or vaccine clinic. Aware to provide a copy of the vaccination record if obtained from local pharmacy or Health Dept. Verbalized acceptance and understanding.  Qualifies for Shingles Vaccine? Yes   Zostavax completed No   Shingrix Completed?: No.    Education has been provided regarding the importance of this vaccine. Patient has been advised to call insurance company to determine out of pocket expense if they have not yet received this vaccine. Advised may also receive vaccine at local pharmacy or Health Dept. Verbalized acceptance and understanding.  Screening Tests Health Maintenance  Topic Date Due   Zoster Vaccines- Shingrix (1 of 2) 04/30/1987   DEXA SCAN  Never done   COVID-19 Vaccine (4 - 2024-25 season) 12/29/2022   Medicare Annual Wellness (AWV)  06/12/2024   Pneumonia Vaccine 65+ Years old  Completed   INFLUENZA VACCINE  Completed   HPV VACCINES  Aged Out   DTaP/Tdap/Td  Discontinued    Health Maintenance  Health Maintenance Due  Topic Date Due   Zoster Vaccines- Shingrix (1 of 2) 04/30/1987   DEXA SCAN  Never done   COVID-19 Vaccine (4 - 2024-25 season) 12/29/2022    Bone Density status: Ordered Deferred. Pt provided with contact info and advised to call to schedule appt.    Additional Screening:    Vision Screening: Recommended annual ophthalmology exams for early detection of glaucoma and other disorders of the eye. Is the patient up to date with their annual eye exam?  Yes  Who is the provider or what is the name of the office in which the patient attends annual  eye exams? [facility] If pt is not established with a provider, would they like to be referred to a provider to establish care? No .   Dental Screening: Recommended annual dental exams for proper oral hygiene    Community Resource Referral / Chronic Care Management:  CRR required this visit?  No   CCM required this visit?  No     Plan:     I have personally reviewed and noted the following in the patient's chart:   Medical and social history Use of alcohol, tobacco or illicit drugs  Current medications and supplements including opioid prescriptions. Patient is not currently taking opioid prescriptions. Functional ability and status Nutritional status Physical activity Advanced directives List of other physicians Hospitalizations, surgeries, and ER visits in previous 12 months Vitals Screenings to include cognitive, depression, and falls Referrals and appointments  In addition, I have reviewed and discussed with patient certain preventive protocols, quality metrics, and best practice recommendations. A  written personalized care plan for preventive services as well as general preventive health recommendations were provided to patient.     Tillie Rung, LPN   4/[id number]/[id number]   After Visit Summary: (MyChart) Due to this being a telephonic visit, the after visit summary with patients personalized plan was offered to patient via MyChart   Nurse Notes: None

## 2023-07-02 ENCOUNTER — Telehealth: Payer: Self-pay

## 2023-07-02 NOTE — Patient Instructions (Signed)
 Visit Information  Thank you for taking time to visit with me today. Please don't hesitate to contact me if I can be of assistance to you.   Following are the goals we discussed today:   Goals Addressed             This Visit's Progress    COMPLETED: Heart Failure Management       Patient Goals/Self Care Activities: -Patient/Caregiver will self-administer medications as prescribed as evidenced by self-report/primary caregiver report  -Patient/Caregiver will attend all scheduled provider appointments as evidenced by clinician review of documented attendance to scheduled appointments and patient/caregiver report -Patient/Caregiver will call provider office for new concerns or questions as evidenced by review of documented incoming telephone call notes and patient report  -Weigh daily and record (notify MD with 3 lb weight gain over night or 5 lb in a week) -Adhere to low sodium diet   Patient reports doing good. Patient weight 140 to 143 lbs   Reviewed heart failure management and importance of limiting salt intake.  No RN CM concerns.  Discussed case closure with patient as she is doing well and meeting goals.  She is agreeable.          If you are experiencing a Mental Health or Behavioral Health Crisis or need someone to talk to, please call the Suicide and Crisis Lifeline: 988   Patient verbalizes understanding of instructions and care plan provided today and agrees to view in MyChart. Active MyChart status and patient understanding of how to access instructions and care plan via MyChart confirmed with patient.     The patient has been provided with contact information for the care management team and has been advised to call with any health related questions or concerns.   Bary Leriche RN, MSN [facility], [facility] RN Care Manager Direct Dial: [phone]  Fax: [phone] Website: [email or web address]

## 2023-07-02 NOTE — Patient Outreach (Signed)
 Care Coordination   Follow Up Visit Note   07/02/2023 Name: Kathy Becker MRN: [id number] DOB: Jan 16, 1936  Kathy Becker is a 89 y.o. year old female who sees Nelwyn Salisbury, MD for primary care. I spoke with  Kathy Becker by phone today.  What matters to the patients health and wellness today?  Maintaining health    Goals Addressed             This Visit's Progress    COMPLETED: Heart Failure Management       Patient Goals/Self Care Activities: -Patient/Caregiver will self-administer medications as prescribed as evidenced by self-report/primary caregiver report  -Patient/Caregiver will attend all scheduled provider appointments as evidenced by clinician review of documented attendance to scheduled appointments and patient/caregiver report -Patient/Caregiver will call provider office for new concerns or questions as evidenced by review of documented incoming telephone call notes and patient report  -Weigh daily and record (notify MD with 3 lb weight gain over night or 5 lb in a week) -Adhere to low sodium diet   Patient reports doing good. Patient weight 140 to 143 lbs   Reviewed heart failure management and importance of limiting salt intake.  No RN CM concerns.  Discussed case closure with patient as she is doing well and meeting goals.  She is agreeable.          SDOH assessments and interventions completed:  Yes     Care Coordination Interventions:  Yes, provided   Follow up plan: No further intervention required.   Encounter Outcome:  Patient Visit Completed   Bary Leriche RN, MSN [facility], [facility] RN Care Manager Direct Dial: [phone]  Fax: [phone] Website: [email or web address]

## 2023-07-10 ENCOUNTER — Ambulatory Visit: Payer: Medicare Other | Admitting: Family Medicine

## 2023-07-15 ENCOUNTER — Ambulatory Visit (INDEPENDENT_AMBULATORY_CARE_PROVIDER_SITE_OTHER): Admitting: Family Medicine

## 2023-07-15 ENCOUNTER — Encounter: Payer: Self-pay | Admitting: Family Medicine

## 2023-07-15 VITALS — BP 138/74 | HR 61 | Temp 97.5°F | Ht 61.0 in | Wt 145.6 lb

## 2023-07-15 DIAGNOSIS — I714 Abdominal aortic aneurysm, without rupture, unspecified: Secondary | ICD-10-CM | POA: Diagnosis not present

## 2023-07-15 DIAGNOSIS — E782 Mixed hyperlipidemia: Secondary | ICD-10-CM | POA: Diagnosis not present

## 2023-07-15 DIAGNOSIS — K219 Gastro-esophageal reflux disease without esophagitis: Secondary | ICD-10-CM

## 2023-07-15 DIAGNOSIS — N1832 Chronic kidney disease, stage 3b: Secondary | ICD-10-CM | POA: Diagnosis not present

## 2023-07-15 DIAGNOSIS — I10 Essential (primary) hypertension: Secondary | ICD-10-CM | POA: Diagnosis not present

## 2023-07-15 DIAGNOSIS — I252 Old myocardial infarction: Secondary | ICD-10-CM

## 2023-07-15 DIAGNOSIS — E785 Hyperlipidemia, unspecified: Secondary | ICD-10-CM

## 2023-07-15 DIAGNOSIS — I2510 Atherosclerotic heart disease of native coronary artery without angina pectoris: Secondary | ICD-10-CM | POA: Diagnosis not present

## 2023-07-15 DIAGNOSIS — R739 Hyperglycemia, unspecified: Secondary | ICD-10-CM

## 2023-07-15 DIAGNOSIS — I5022 Chronic systolic (congestive) heart failure: Secondary | ICD-10-CM

## 2023-07-15 LAB — TSH: TSH: 1.29 u[IU]/mL (ref 0.35–5.50)

## 2023-07-15 LAB — CBC WITH DIFFERENTIAL/PLATELET
Basophils Absolute: 0.1 10*3/uL (ref 0.0–0.1)
Basophils Relative: 1.4 % (ref 0.0–3.0)
Eosinophils Absolute: 0.1 10*3/uL (ref 0.0–0.7)
Eosinophils Relative: 0.9 % (ref 0.0–5.0)
HCT: 43.2 % (ref 36.0–46.0)
Hemoglobin: 14.1 g/dL (ref 12.0–15.0)
Lymphocytes Relative: 27.7 % (ref 12.0–46.0)
Lymphs Abs: 1.9 10*3/uL (ref 0.7–4.0)
MCHC: 32.6 g/dL (ref 30.0–36.0)
MCV: 90.4 fl (ref 78.0–100.0)
Monocytes Absolute: 0.3 10*3/uL (ref 0.1–1.0)
Monocytes Relative: 4.9 % (ref 3.0–12.0)
Neutro Abs: 4.4 10*3/uL (ref 1.4–7.7)
Neutrophils Relative %: 65.1 % (ref 43.0–77.0)
Platelets: 143 10*3/uL — ABNORMAL LOW (ref 150.0–400.0)
RBC: 4.78 Mil/uL (ref 3.87–5.11)
RDW: 15.3 % (ref 11.5–15.5)
WBC: 6.7 10*3/uL (ref 4.0–10.5)

## 2023-07-15 LAB — LIPID PANEL
Cholesterol: 233 mg/dL — ABNORMAL HIGH (ref 0–200)
HDL: 58.3 mg/dL (ref >39.00)
LDL Cholesterol: 152 mg/dL — ABNORMAL HIGH (ref 0–99)
NonHDL: 174.56
Total CHOL/HDL Ratio: 4
Triglycerides: 113 mg/dL (ref 0.0–149.0)
VLDL: 22.6 mg/dL (ref 0.0–40.0)

## 2023-07-15 LAB — BASIC METABOLIC PANEL
BUN: 26 mg/dL — ABNORMAL HIGH (ref 6–23)
CO2: 21 meq/L (ref 19–32)
Calcium: 9.1 mg/dL (ref 8.4–10.5)
Chloride: 112 meq/L (ref 96–112)
Creatinine, Ser: 1.82 mg/dL — ABNORMAL HIGH (ref 0.40–1.20)
GFR: 24.92 mL/min — ABNORMAL LOW (ref >60.00)
Glucose, Bld: 111 mg/dL — ABNORMAL HIGH (ref 70–99)
Potassium: 5 meq/L (ref 3.5–5.1)
Sodium: 141 meq/L (ref 135–145)

## 2023-07-15 LAB — HEMOGLOBIN A1C: Hgb A1c MFr Bld: 6 % (ref 4.6–6.5)

## 2023-07-15 LAB — HEPATIC FUNCTION PANEL
ALT: 11 U/L (ref 0–35)
AST: 17 U/L (ref 0–37)
Albumin: 4.1 g/dL (ref 3.5–5.2)
Alkaline Phosphatase: 108 U/L (ref 39–117)
Bilirubin, Direct: 0.1 mg/dL (ref 0.0–0.3)
Total Bilirubin: 0.4 mg/dL (ref 0.2–1.2)
Total Protein: 6.9 g/dL (ref 6.0–8.3)

## 2023-07-15 MED ORDER — OMEPRAZOLE 40 MG PO CPDR: DELAYED_RELEASE_CAPSULE | ORAL | 11 refills | Status: DC

## 2023-07-15 MED ORDER — PRAVASTATIN SODIUM 40 MG PO TABS: 40.0000 mg | ORAL_TABLET | Freq: Every day | ORAL | 11 refills | Status: DC

## 2023-07-15 MED ORDER — INDOMETHACIN 50 MG PO CAPS: 50.0000 mg | ORAL_CAPSULE | Freq: Three times a day (TID) | ORAL | 3 refills | Status: DC | PRN

## 2023-07-15 NOTE — Progress Notes (Signed)
 Subjective:    Patient ID: Kathy Becker, female    DOB: 04/20/1936, 88 y.o.   MRN: [id number]  HPI Here to follow up on issues. She feels great. She has a gout flare several times a year, but these respond quickly if she takes Indomethacin. She sees Dr. Vallery Sa for her CKD. She sees Clarisa Kindred NP for cardiology care. She had an ECHO last December showing stable CHF. Her EF was 35-40% with Grade I diastolic dysfunction. Her BP is stable. Her GERD is stable. She still drives and gets around independently.    Review of Systems  Constitutional: Negative.   HENT: Negative.    Eyes: Negative.   Respiratory: Negative.    Cardiovascular: Negative.   Gastrointestinal: Negative.   Genitourinary:  Negative for decreased urine volume, difficulty urinating, dyspareunia, dysuria, enuresis, flank pain, frequency, hematuria, pelvic pain and urgency.  Musculoskeletal: Negative.   Skin: Negative.   Neurological: Negative.  Negative for headaches.  Psychiatric/Behavioral: Negative.         Objective:   Physical Exam Constitutional:      General: She is not in acute distress.    Appearance: Normal appearance. She is well-developed.  HENT:     Head: Normocephalic and atraumatic.     Right Ear: External ear normal.     Left Ear: External ear normal.     Nose: Nose normal.     Mouth/Throat:     Pharynx: No oropharyngeal exudate.  Eyes:     General: No scleral icterus.    Conjunctiva/sclera: Conjunctivae normal.     Pupils: Pupils are equal, round, and reactive to light.  Neck:     Thyroid: No thyromegaly.     Vascular: No JVD.  Cardiovascular:     Rate and Rhythm: Normal rate and regular rhythm.     Pulses: Normal pulses.     Heart sounds: Normal heart sounds. No murmur heard.    No friction rub. No gallop.  Pulmonary:     Effort: Pulmonary effort is normal. No respiratory distress.     Breath sounds: Normal breath sounds. No wheezing or rales.  Chest:     Chest wall: No  tenderness.  Abdominal:     General: Bowel sounds are normal. There is no distension.     Palpations: Abdomen is soft. There is no mass.     Tenderness: There is no abdominal tenderness. There is no guarding or rebound.  Musculoskeletal:        General: No tenderness. Normal range of motion.     Cervical back: Normal range of motion and neck supple.  Lymphadenopathy:     Cervical: No cervical adenopathy.  Skin:    General: Skin is warm and dry.     Findings: No erythema or rash.  Neurological:     General: No focal deficit present.     Mental Status: She is alert and oriented to person, place, and time.     Cranial Nerves: No cranial nerve deficit.     Motor: No abnormal muscle tone.     Coordination: Coordination normal.     Deep Tendon Reflexes: Reflexes are normal and symmetric. Reflexes normal.  Psychiatric:        Mood and Affect: Mood normal.        Behavior: Behavior normal.        Thought Content: Thought content normal.        Judgment: Judgment normal.  Assessment & Plan:  She is doing well overall. Her CHF and CAD and CKD are stable. Her GERD is well controlled. Her gout is stable. We will get fasting labs to check lipids, etc. We spent a total of (33   ) minutes reviewing records and discussing these issues.  Gershon Crane, MD

## 2023-07-19 ENCOUNTER — Other Ambulatory Visit: Payer: Self-pay | Admitting: Family Medicine

## 2023-07-19 DIAGNOSIS — E782 Mixed hyperlipidemia: Secondary | ICD-10-CM

## 2023-08-27 ENCOUNTER — Telehealth: Payer: Self-pay | Admitting: Cardiology

## 2023-08-27 NOTE — Telephone Encounter (Signed)
 Called to confirm/remind patient of their appointment at the [facility] on 08/28/23.   Appointment:   [] Confirmed  [x] Left mess   [] No answer/No voice mail  [] VM Full/unable to leave message  [] Phone not in service  Patient reminded to bring all medications and/or complete list.  Confirmed patient has transportation. Gave directions, instructed to utilize valet parking.

## 2023-08-27 NOTE — Progress Notes (Signed)
 [facility] Note    PCP: Corita Diego, MD (last seen 03/25) Primary Cardiologist: None  Chief Complaint: shortness of breath   HPI:  Kathy Becker is a 88 y/o female with a history of  CAD (MI), hyperlipidemia, HTN, CKD, tobacco use and chronic heart failure.   Echo 06/23/2018: EF 40-45% along with elevated mean left atrial pressure  Was in the ED 06/28/22 due to acute on chronic heart failure. IV lasix  given w/ improvement of symptoms.   Echo 08/08/22: EF 25-30% along with mild LAE and moderate/ severe MR.   Admitted 10/28/22 due to acute onset of worsening dyspnea as well as lower extremity edema. IV diuresed and transitioned to oral diuretics with improvement of symptoms.   Echo 04/18/23: EF 35-40% with Grade I DD  Was seen in the HF clinic 12/24 and had metoprolol  succinate increased to 50mg  daily.   She presents today for a HF f/u visit with a chief complaint of shortness of breath. She has associated minimal fatigue with this. Denies chest pain, cough, palpitations, abdominal distention, pedal edema, dizziness or difficulty sleeping. Currently is having gout pain in her left foot and has been taking indocin  BID for the last week. Says that her pain is slowly improving and she's wondering if this is why her BP is elevated. Does not check her BP consistently nor weigh herself consistently.   Goes to [employer] daily and roundtrip is 1/2 mile. Makes this trip 1-2 times/ day without any difficulties.   ROS: All systems negative except as listed in HPI, PMH and Problem List.  SH:  Social History   Socioeconomic History   Marital status: Legally Separated    Spouse name: Not on file   Number of children: Not on file   Years of education: Not on file   Highest education level: 12th grade  Occupational History   Not on file  Tobacco Use   Smoking status: Former    Passive exposure: Never   Smokeless tobacco: Never  Vaping Use   Vaping status: Never Used   Substance and Sexual Activity   Alcohol use: No    Alcohol/week: 0.0 standard drinks of alcohol   Drug use: No   Sexual activity: Not on file  Other Topics Concern   Not on file  Social History Narrative   Not on file   Social Drivers of Health   Financial Resource Strain: Low Risk  (07/14/2023)   Overall Financial Resource Strain (CARDIA)    Difficulty of Paying Living Expenses: Not very hard  Food Insecurity: Unknown (07/14/2023)   Hunger Vital Sign    Worried About Running Out of Food in the Last Year: Never true    Ran Out of Food in the Last Year: Patient declined  Transportation Needs: No Transportation Needs (07/14/2023)   PRAPARE - Administrator, Civil Service (Medical): No    Lack of Transportation (Non-Medical): No  Physical Activity: Insufficiently Active (07/14/2023)   Exercise Vital Sign    Days of Exercise per Week: 5 days    Minutes of Exercise per Session: 10 min  Stress: No Stress Concern Present (07/14/2023)   [employer] of Occupational Health - Occupational Stress Questionnaire    Feeling of Stress : Not at all  Social Connections: Moderately Integrated (07/14/2023)   Social Connection and Isolation Panel [NHANES]    Frequency of Communication with Friends and Family: More than three times a week    Frequency of Social Gatherings with  Friends and Family: Three times a week    Attends Religious Services: More than 4 times per year    Active Member of Clubs or Organizations: Yes    Attends Banker Meetings: More than 4 times per year    Marital Status: Widowed  Intimate Partner Violence: Not At Risk (04/08/2023)   Humiliation, Afraid, Rape, and Kick questionnaire    Fear of Current or Ex-Partner: No    Emotionally Abused: No    Physically Abused: No    Sexually Abused: No    FH:  Family History  Problem Relation Age of Onset   Coronary artery disease Other     Past Medical History:  Diagnosis Date   CAD (coronary  artery disease)    stress test 08-15-08 showing lateral scar but no reversible ischemia    CHF (congestive heart failure) (HCC)    Hyperlipidemia    Hypertension    Myocardial infarction [facility])    Renal insufficiency     Current Outpatient Medications  Medication Sig Dispense Refill   aspirin  EC 81 MG tablet Take 81 mg by mouth at bedtime.     FARXIGA  10 MG TABS tablet TAKE 1 TABLET(10 MG) BY MOUTH DAILY BEFORE BREAKFAST 30 tablet 6   indomethacin  (INDOCIN ) 50 MG capsule Take 1 capsule (50 mg total) by mouth 3 (three) times daily as needed (gout pain). 90 capsule 3   metoprolol  succinate (TOPROL -XL) 50 MG 24 hr tablet Take 1 tablet (50 mg total) by mouth daily. Take with or immediately following a meal. 90 tablet 3   omeprazole  (PRILOSEC) 40 MG capsule TAKE 1 CAPSULE(40 MG) BY MOUTH DAILY 30 capsule 11   pravastatin  (PRAVACHOL ) 40 MG tablet Take 1 tablet (40 mg total) by mouth daily. 30 tablet 11   spironolactone  (ALDACTONE ) 25 MG tablet Take 1 tablet (25 mg total) by mouth daily. 90 tablet 3   valsartan  (DIOVAN ) 160 MG tablet TAKE 1 TABLET(160 MG) BY MOUTH DAILY 30 tablet 5   No current facility-administered medications for this visit.    Vitals:   08/28/23 1005 08/28/23 1007  BP: (!) 149/112 (!) 196/85  Pulse: 64 60  SpO2: 98% 100%  Weight: 145 lb (65.8 kg)    Wt Readings from Last 3 Encounters:  08/28/23 145 lb (65.8 kg)  07/15/23 145 lb 9.6 oz (66 kg)  06/13/23 142 lb (64.4 kg)   Lab Results  Component Value Date   CREATININE 1.82 (H) 07/15/2023   CREATININE 1.40 (H) 02/13/2023   CREATININE 1.51 (H) 01/14/2023   EKG: not done   PHYSICAL EXAM:  General: Well appearing. No resp difficulty HEENT: normal Neck: supple, no JVD Cor: Regular rhythm, rate. No rubs, gallops or murmurs Lungs: clear Abdomen: soft, nontender, nondistended. Extremities: no cyanosis, clubbing, rash, edema Neuro: alert & oriented X 3. Moves all 4 extremities w/o difficulty. Affect  pleasant   ASSESSMENT & PLAN:  1: NICM with reduced ejection fraction- - etiology likely HTN - NYHA class II - euvolemic - weight up 3 pounds from last visit here 4 months ago - Echo 06/23/2018: EF 40-45% along with elevated mean left atrial pressure - Echo 08/08/22: EF 25-30% along with mild LAE and moderate/ severe MR.  - Echo 04/18/23: EF 35-40% with Grade I DD - adds salt "on occasion" to foods like potatoes/ tomatoes; still likes to eat out at [employer] and plans to continue - continue furosemide  20mg  daily PRN; emphasized weighing daily and taking furosemide  if gains >2 pounds  overnight - continue valsartan  160mg  daily  - continue farxiga  10mg  daily - continue metoprolol  succinate 50mg  daily  - continue spironolactone  25mg  daily - walks to her mailbox and back daily which is 1/2 mile total along with working in her yard - discussed referral to cardiologist but she would like to wait on this for now; saw a cardiologist at [facility] on [address] ~ 2010 - BNP 10/28/22 was 926.5  2: HTN w/ CKD- - BP 149/112 and recheck was 196/85 - BP log provided and emphasized checking BP daily, write it down and bring to next appt - having pain in her foot from gout; instructed to take furosemide  today and tomorrow for her BP - if needs additional agent, will need once daily dosing as she says that BID dosing will not work for her; isn't particularly interested in taking any new medications today as she feels her BP is elevated due to gout pain - saw PCP Alyne Babinski) 03/25 - BMP 07/15/23 reviewed: sodium 141, potassium 5.0, creatinine 1.82 and GFR 24.92 - sees nephrology next week - BMET today  3: Carotid stenosis- - right ICA stenosis measuring 40 to 59%, left without stenosis.  - saw vascular ([facility]) 02/24 - AAA 3.2 cm  4: Hyperlipidemia- - lipid panel 07/15/23: LDL 152, cholesterol 233 - not interested in treatment today; "my cholesterol has always been high"   Return in 1 month,  sooner if needed.   Charlette Console, FNP 08/27/23

## 2023-08-28 ENCOUNTER — Encounter: Payer: Self-pay | Admitting: Family

## 2023-08-28 ENCOUNTER — Ambulatory Visit: Payer: Medicare Other | Attending: Family | Admitting: Family

## 2023-08-28 VITALS — BP 196/85 | HR 60 | Wt 145.0 lb

## 2023-08-28 DIAGNOSIS — I2510 Atherosclerotic heart disease of native coronary artery without angina pectoris: Secondary | ICD-10-CM | POA: Diagnosis not present

## 2023-08-28 DIAGNOSIS — I5022 Chronic systolic (congestive) heart failure: Secondary | ICD-10-CM | POA: Diagnosis not present

## 2023-08-28 DIAGNOSIS — I10 Essential (primary) hypertension: Secondary | ICD-10-CM

## 2023-08-28 DIAGNOSIS — E785 Hyperlipidemia, unspecified: Secondary | ICD-10-CM | POA: Insufficient documentation

## 2023-08-28 DIAGNOSIS — N189 Chronic kidney disease, unspecified: Secondary | ICD-10-CM | POA: Diagnosis not present

## 2023-08-28 DIAGNOSIS — I428 Other cardiomyopathies: Secondary | ICD-10-CM | POA: Diagnosis not present

## 2023-08-28 DIAGNOSIS — I130 Hypertensive heart and chronic kidney disease with heart failure and stage 1 through stage 4 chronic kidney disease, or unspecified chronic kidney disease: Secondary | ICD-10-CM | POA: Diagnosis not present

## 2023-08-28 DIAGNOSIS — I714 Abdominal aortic aneurysm, without rupture, unspecified: Secondary | ICD-10-CM | POA: Insufficient documentation

## 2023-08-28 DIAGNOSIS — I6521 Occlusion and stenosis of right carotid artery: Secondary | ICD-10-CM | POA: Diagnosis not present

## 2023-08-28 DIAGNOSIS — I252 Old myocardial infarction: Secondary | ICD-10-CM | POA: Diagnosis not present

## 2023-08-28 DIAGNOSIS — I509 Heart failure, unspecified: Secondary | ICD-10-CM | POA: Diagnosis not present

## 2023-08-28 DIAGNOSIS — Z79899 Other long term (current) drug therapy: Secondary | ICD-10-CM | POA: Diagnosis not present

## 2023-08-28 DIAGNOSIS — E782 Mixed hyperlipidemia: Secondary | ICD-10-CM

## 2023-08-28 DIAGNOSIS — Z7984 Long term (current) use of oral hypoglycemic drugs: Secondary | ICD-10-CM | POA: Diagnosis not present

## 2023-08-28 DIAGNOSIS — R0602 Shortness of breath: Secondary | ICD-10-CM | POA: Diagnosis present

## 2023-08-28 NOTE — Patient Instructions (Addendum)
 Begin weighing daily and take your lasix  for an overnight weight gain of 3 pounds.  Take your lasix  today and tomorrow.  Labs done today. Take the elevator to the Lower Level (LL) and check in at the [facility] lobby for lab work. Your results will be available in MyChart, we will contact you for abnormal readings.

## 2023-08-28 NOTE — Progress Notes (Cosign Needed)
 [facility] - HEART FAILURE CLINIC - PHARMACIST COUNSELING NOTE  Guideline-Directed Medical Therapy/Evidence Based Medicine  ACE/ARB/ARNI: N/A patient will not take twice daily medications Beta Blocker: Metoprolol  succinate 50 mg daily Aldosterone Antagonist: Spironolactone  25 mg daily Diuretic: Furosemide  20 mg PRN SGLT2i: Farxiga  10 mg daily   Adherence Assessment  Do you ever forget to take your medication? [] Yes [x] No  Do you ever skip doses due to side effects? [] Yes [x] No  Do you have trouble affording your medicines? [] Yes [x] No  Are you ever unable to pick up your medication due to transportation difficulties? [] Yes [x] No  Do you ever stop taking your medications because you don't believe they are helping? [] Yes [x] No  Do you check your weight daily? [] Yes [x] No   Barriers:  -- Patient is resistant to starting new medications and will only take medications once daily -- They are skeptical of medications and general -- They do not want excess pills to be pushed on them   Vital signs: HR 60, BP 196/85 & 149/112, weight (pounds) 145 lbs PTA 143 - 145 lbs usually  PTA BP: patient doesn't regularly check  GFR 37 07/15/23 Echo 06/23/2018: EF 40-45% along with elevated mean left atrial pressure Echo 08/08/22: EF 25-30% along with mild LAE and moderate/ severe MR.  Echo 04/18/23: EF 35-40% with Grade I DD     Latest Ref Rng & Units 07/15/2023   10:54 AM 02/13/2023   10:31 AM 01/14/2023   10:58 AM  BMP  Glucose 70 - 99 mg/dL 272  536  644   BUN 6 - 23 mg/dL 26  17  26    Creatinine 0.40 - 1.20 mg/dL 0.34  7.42  5.95   BUN/Creat Ratio 12 - 28   17   Sodium 135 - 145 mEq/L 141  139  139   Potassium 3.5 - 5.1 mEq/L 5.0  4.0  4.8   Chloride 96 - 112 mEq/L 112  109  105   CO2 19 - 32 mEq/L 21  23  16    Calcium 8.4 - 10.5 mg/dL 9.1  8.8  9.1    Past Medical History:  Diagnosis Date   CAD (coronary artery disease)    stress test 08-15-08 showing lateral  scar but no reversible ischemia    CHF (congestive heart failure) (HCC)    Hyperlipidemia    Hypertension    Myocardial infarction [facility])    Renal insufficiency    ASSESSMENT NT is a 88 year old female who presents to the HF clinic alone for her 4 month follow-up appointment. They have a past medical history significant for CAD (MI), hyperlipidemia, HTN, CKD, tobacco use and chronic heart failure. The only medication change made last visit was increasing their metoprolol  succinate to 50 mg daily.   When discussing medications with the patient, the patient was clear they are not wanting to take any unnecessary medications and will only take medications once a day. They said that they are taking the medications Brian Campanile is prescribing and that whatever tells them they will do.   Talking about symptoms, the patient stated that they are having a gout flare and are taking their indocin  2 pills daily maximum for the last week. When asked to describe the pain, the patient denied to provide a 1 - 10 scale but said it felt like their ankle was sprained. They denied thinking the pain was causing their high blood pressure. They don't regularly check their BP at home, but offered  to BP log to help with management. When asked about fluids, they mentioned that they feel tightness in their hands and some developing in their legs at times. Patient denied taking Lasix , said their doctor told them not to touch it. Explain that they can take single doses of their fluid pill if they feel like they are starting the retain fluid in their hands or feet.   Recent ED Visit (past 6 months):  NA  PLAN  Prevention LDL 152 07/15/23 -- On pravastatin  40 mg daily  -- Patient reported they will never take Crestor again because it made them mean, and they are not interested in further cholesterol management because "that's life, you will have cholesterol until you die" A1c 6.0 07/15/23  Recommendations  -- Recommend starting  amlodipine  5 mg daily since patient only is interested in once daily medications  -- Recommend patient take single doses of Lasix  when they feel tightness in their hands like they have fluid build up.    Time spent: 15 minutes  Craven Do, PharmD Pharmacy Resident  08/28/2023 10:15 AM  Current Outpatient Medications:    aspirin  EC 81 MG tablet, Take 81 mg by mouth at bedtime., Disp: , Rfl:    FARXIGA  10 MG TABS tablet, TAKE 1 TABLET(10 MG) BY MOUTH DAILY BEFORE BREAKFAST, Disp: 30 tablet, Rfl: 6   indomethacin  (INDOCIN ) 50 MG capsule, Take 1 capsule (50 mg total) by mouth 3 (three) times daily as needed (gout pain)., Disp: 90 capsule, Rfl: 3   metoprolol  succinate (TOPROL -XL) 50 MG 24 hr tablet, Take 1 tablet (50 mg total) by mouth daily. Take with or immediately following a meal., Disp: 90 tablet, Rfl: 3   omeprazole  (PRILOSEC) 40 MG capsule, TAKE 1 CAPSULE(40 MG) BY MOUTH DAILY, Disp: 30 capsule, Rfl: 11   pravastatin  (PRAVACHOL ) 40 MG tablet, Take 1 tablet (40 mg total) by mouth daily., Disp: 30 tablet, Rfl: 11   spironolactone  (ALDACTONE ) 25 MG tablet, Take 1 tablet (25 mg total) by mouth daily., Disp: 90 tablet, Rfl: 3   valsartan  (DIOVAN ) 160 MG tablet, TAKE 1 TABLET(160 MG) BY MOUTH DAILY, Disp: 30 tablet, Rfl: 5

## 2023-08-29 ENCOUNTER — Encounter: Payer: Self-pay | Admitting: Family

## 2023-08-29 ENCOUNTER — Other Ambulatory Visit: Payer: Self-pay | Admitting: Family

## 2023-08-29 DIAGNOSIS — I5022 Chronic systolic (congestive) heart failure: Secondary | ICD-10-CM

## 2023-08-29 LAB — BASIC METABOLIC PANEL WITH GFR
BUN/Creatinine Ratio: 18 (ref 12–28)
BUN: 32 mg/dL — ABNORMAL HIGH (ref 8–27)
CO2: 18 mmol/L — ABNORMAL LOW (ref 20–29)
Calcium: 9.3 mg/dL (ref 8.7–10.3)
Chloride: 107 mmol/L — ABNORMAL HIGH (ref 96–106)
Creatinine, Ser: 1.81 mg/dL — ABNORMAL HIGH (ref 0.57–1.00)
Glucose: 104 mg/dL — ABNORMAL HIGH (ref 70–99)
Potassium: 5.9 mmol/L — ABNORMAL HIGH (ref 3.5–5.2)
Sodium: 142 mmol/L (ref 134–144)
eGFR: 27 mL/min/{1.73_m2} — ABNORMAL LOW (ref >59)

## 2023-09-01 DIAGNOSIS — M109 Gout, unspecified: Secondary | ICD-10-CM | POA: Diagnosis not present

## 2023-09-01 DIAGNOSIS — E875 Hyperkalemia: Secondary | ICD-10-CM | POA: Diagnosis not present

## 2023-09-01 DIAGNOSIS — I129 Hypertensive chronic kidney disease with stage 1 through stage 4 chronic kidney disease, or unspecified chronic kidney disease: Secondary | ICD-10-CM | POA: Diagnosis not present

## 2023-09-01 DIAGNOSIS — K219 Gastro-esophageal reflux disease without esophagitis: Secondary | ICD-10-CM | POA: Diagnosis not present

## 2023-09-01 DIAGNOSIS — N1832 Chronic kidney disease, stage 3b: Secondary | ICD-10-CM | POA: Diagnosis not present

## 2023-09-01 DIAGNOSIS — R7303 Prediabetes: Secondary | ICD-10-CM | POA: Diagnosis not present

## 2023-09-02 ENCOUNTER — Other Ambulatory Visit: Payer: Self-pay | Admitting: Family

## 2023-09-03 LAB — LAB REPORT - SCANNED
Creatinine, POC: 159.2 mg/dL
EGFR: 27

## 2023-09-29 ENCOUNTER — Encounter: Admitting: Family

## 2023-09-29 ENCOUNTER — Telehealth: Payer: Self-pay | Admitting: Family

## 2023-09-29 NOTE — Telephone Encounter (Signed)
 Called to confirm/remind patient of their appointment at the [facility] on 09/30/23.   Appointment:   [] Confirmed  [x] Left mess   [] No answer/No voice mail  [] VM Full/unable to leave message  [] Phone not in service  Patient reminded to bring all medications and/or complete list.  Confirmed patient has transportation. Gave directions, instructed to utilize valet parking.

## 2023-09-29 NOTE — Progress Notes (Signed)
 Advanced [facility] Failure Clinic Note    PCP: Corita Diego, MD (last seen 03/25) Primary Cardiologist: None  Chief Complaint: shortness of breath   HPI:  Kathy Becker is a 88 y/o female with a history of  CAD (MI), hyperlipidemia, HTN, CKD, tobacco use and chronic heart failure.   Echo 06/23/2018: EF 40-45% along with elevated mean left atrial pressure  Was in the ED 06/28/22 due to acute on chronic heart failure. IV lasix  given w/ improvement of symptoms.   Echo 08/08/22: EF 25-30% along with mild LAE and moderate/ severe MR.   Admitted 10/28/22 due to acute onset of worsening dyspnea as well as lower extremity edema. IV diuresed and transitioned to oral diuretics with improvement of symptoms.   Echo 04/18/23: EF 35-40% with Grade I DD  Was seen in the HF clinic 12/24 and had metoprolol  succinate increased to 50mg  daily.   Seen in [facility] 05/25 and after lab results obtained, she was called and told to hold spiro for 2 days and then resume at 12.5mg  daily. Instructed to get labs repeated either with nephrology or come to [facility].   She presents today for a HF f/u visit with a chief complaint of shortness of breath. For last 3-4 days, has felt dizzy and weak. Denies chest pain, cough, palpitations, abdominal distention, pedal edema or difficulty sleeping. Upon reviewing meds, she says that she has continued taking the 25mg  spironolactone  tablet instead of 1/2 tablet. She says that she saw nephrology ~ 2 weeks ago who recommended stoppage but she, again, has continued taking it. (No nephrology note available at this time). Did not get repeat labs drawn.   ROS: All systems negative except as listed in HPI, PMH and Problem List.  SH:  Social History   Socioeconomic History   Marital status: Legally Separated    Spouse name: Not on file   Number of children: Not on file   Years of education: Not on file   Highest education level: 12th grade  Occupational History   Not on file   Tobacco Use   Smoking status: Former    Passive exposure: Never   Smokeless tobacco: Never  Vaping Use   Vaping status: Never Used  Substance and Sexual Activity   Alcohol use: No    Alcohol/week: 0.0 standard drinks of alcohol   Drug use: No   Sexual activity: Not on file  Other Topics Concern   Not on file  Social History Narrative   Not on file   Social Drivers of Health   Financial Resource Strain: Low Risk  (07/14/2023)   Overall Financial Resource Strain (CARDIA)    Difficulty of Paying Living Expenses: Not very hard  Food Insecurity: Unknown (07/14/2023)   Hunger Vital Sign    Worried About Running Out of Food in the Last Year: Never true    Ran Out of Food in the Last Year: Patient declined  Transportation Needs: No Transportation Needs (07/14/2023)   PRAPARE - Administrator, Civil Service (Medical): No    Lack of Transportation (Non-Medical): No  Physical Activity: Insufficiently Active (07/14/2023)   Exercise Vital Sign    Days of Exercise per Week: 5 days    Minutes of Exercise per Session: 10 min  Stress: No Stress Concern Present (07/14/2023)   [employer] of Occupational Health - Occupational Stress Questionnaire    Feeling of Stress : Not at all  Social Connections: Moderately Integrated (07/14/2023)   Social Connection and  Isolation Panel [NHANES]    Frequency of Communication with Friends and Family: More than three times a week    Frequency of Social Gatherings with Friends and Family: Three times a week    Attends Religious Services: More than 4 times per year    Active Member of Clubs or Organizations: Yes    Attends Banker Meetings: More than 4 times per year    Marital Status: Widowed  Intimate Partner Violence: Not At Risk (04/08/2023)   Humiliation, Afraid, Rape, and Kick questionnaire    Fear of Current or Ex-Partner: No    Emotionally Abused: No    Physically Abused: No    Sexually Abused: No    FH:  Family  History  Problem Relation Age of Onset   Coronary artery disease Other     Past Medical History:  Diagnosis Date   CAD (coronary artery disease)    stress test 08-15-08 showing lateral scar but no reversible ischemia    CHF (congestive heart failure) (HCC)    Hyperlipidemia    Hypertension    Myocardial infarction [facility])    Renal insufficiency     Current Outpatient Medications  Medication Sig Dispense Refill   aspirin  EC 81 MG tablet Take 81 mg by mouth at bedtime.     FARXIGA  10 MG TABS tablet TAKE 1 TABLET(10 MG) BY MOUTH DAILY BEFORE BREAKFAST 30 tablet 6   indomethacin  (INDOCIN ) 50 MG capsule Take 1 capsule (50 mg total) by mouth 3 (three) times daily as needed (gout pain). 90 capsule 3   metoprolol  succinate (TOPROL -XL) 50 MG 24 hr tablet Take 1 tablet (50 mg total) by mouth daily. Take with or immediately following a meal. 90 tablet 3   omeprazole  (PRILOSEC) 40 MG capsule TAKE 1 CAPSULE(40 MG) BY MOUTH DAILY 30 capsule 11   pravastatin  (PRAVACHOL ) 40 MG tablet Take 1 tablet (40 mg total) by mouth daily. 30 tablet 11   valsartan  (DIOVAN ) 160 MG tablet TAKE 1 TABLET(160 MG) BY MOUTH DAILY 30 tablet 5   No current facility-administered medications for this visit.   Vitals:   09/30/23 1038 09/30/23 1039  BP: (!) 147/89   Pulse: 87 77  SpO2: 100% 99%  Weight: 141 lb (64 kg)    Wt Readings from Last 3 Encounters:  09/30/23 141 lb (64 kg)  08/28/23 145 lb (65.8 kg)  07/15/23 145 lb 9.6 oz (66 kg)   Lab Results  Component Value Date   CREATININE 1.59 (H) 09/30/2023   CREATININE 1.81 (H) 08/28/2023   CREATININE 1.82 (H) 07/15/2023    EKG: NSR, HR 77 without peaked T waves (personally reviewed)   PHYSICAL EXAM:  General: Well appearing. No resp difficulty HEENT: normal Neck: supple, no JVD Cor: Regular rhythm, rate. No rubs, gallops or murmurs Lungs: clear Abdomen: soft, nontender, nondistended. Extremities: no cyanosis, clubbing, rash, edema Neuro: alert &  oriented X 3. Moves all 4 extremities w/o difficulty. Affect pleasant   ASSESSMENT & PLAN:  1: NICM with reduced ejection fraction- - etiology likely HTN - NYHA class II - euvolemic - weight down 4 pounds from last visit here 1 month ago - Echo 06/23/2018: EF 40-45% along with elevated mean left atrial pressure - Echo 08/08/22: EF 25-30% along with mild LAE and moderate/ severe MR.  - Echo 04/18/23: EF 35-40% with Grade I DD - adds salt "on occasion" to foods like potatoes/ tomatoes; still likes to eat out at [employer] and plans to continue - continue  farxiga  10mg  daily - continue metoprolol  succinate 50mg  daily  - continue furosemide  20mg  daily PRN; emphasized weighing daily and taking furosemide  if gains >2 pounds overnight - continue valsartan  160mg  daily  - continue spironolactone  25mg  daily as she's been taking this dose since last visit (was supposed to have held it X 2 days and then decrease it) - EKG today to make sure no peaked T waves. NSR today - BMET today - discussed referral to cardiologist but she would like to wait on this for now; saw a cardiologist at [facility] on [address] ~ 2010 - BNP 10/28/22 was 926.5  2: HTN w/ CKD- - BP 147/89 - if needs additional agent, will need once daily dosing as she says that BID dosing will not work for her; isn't particularly interested in taking any new medications - saw PCP Alyne Babinski) 03/25 - BMP 07/15/23 reviewed: sodium 141, potassium 5.0, creatinine 1.82 and GFR 24.92 - she reports seeing nephrology a couple of weeks ago but no note is viewable today - BMET today  3: Carotid stenosis- - right ICA stenosis measuring 40 to 59%, left without stenosis.  - saw vascular (R[facility]ne) 02/24 - AAA 3.2 cm  4: Hyperlipidemia- - lipid panel 07/15/23: LDL 152, cholesterol 233 - continue pravastatin  40mg  daily (started 03/25)   Return in 2 months, sooner if needed.   Charlette Console, FNP 09/29/23

## 2023-09-30 ENCOUNTER — Other Ambulatory Visit
Admission: RE | Admit: 2023-09-30 | Discharge: 2023-09-30 | Disposition: A | Discharge status: Home / Self Care | Source: Ambulatory Visit | Attending: Family | Admitting: Family

## 2023-09-30 ENCOUNTER — Encounter: Payer: Self-pay | Admitting: Family

## 2023-09-30 ENCOUNTER — Ambulatory Visit (HOSPITAL_BASED_OUTPATIENT_CLINIC_OR_DEPARTMENT_OTHER): Admitting: Family

## 2023-09-30 ENCOUNTER — Ambulatory Visit: Payer: Self-pay | Admitting: Family

## 2023-09-30 VITALS — BP 147/89 | HR 77 | Wt 141.0 lb

## 2023-09-30 DIAGNOSIS — I10 Essential (primary) hypertension: Secondary | ICD-10-CM | POA: Diagnosis not present

## 2023-09-30 DIAGNOSIS — E782 Mixed hyperlipidemia: Secondary | ICD-10-CM

## 2023-09-30 DIAGNOSIS — I5022 Chronic systolic (congestive) heart failure: Secondary | ICD-10-CM | POA: Insufficient documentation

## 2023-09-30 DIAGNOSIS — I509 Heart failure, unspecified: Secondary | ICD-10-CM | POA: Diagnosis not present

## 2023-09-30 DIAGNOSIS — I6521 Occlusion and stenosis of right carotid artery: Secondary | ICD-10-CM

## 2023-09-30 LAB — BASIC METABOLIC PANEL WITH GFR
Anion gap: 10 (ref 5–15)
BUN: 23 mg/dL (ref 8–23)
CO2: 19 mmol/L — ABNORMAL LOW (ref 22–32)
Calcium: 8.8 mg/dL — ABNORMAL LOW (ref 8.9–10.3)
Chloride: 109 mmol/L (ref 98–111)
Creatinine, Ser: 1.59 mg/dL — ABNORMAL HIGH (ref 0.44–1.00)
GFR, Estimated: 31 mL/min — ABNORMAL LOW (ref >60)
Glucose, Bld: 152 mg/dL — ABNORMAL HIGH (ref 70–99)
Potassium: 4.8 mmol/L (ref 3.5–5.1)
Sodium: 138 mmol/L (ref 135–145)

## 2023-09-30 NOTE — Patient Instructions (Signed)
 Medication Changes:  No medication changes.   Lab Work:  Go over to the [facility]. Go pass the gift shop and have your blood work completed.  We will only call you if the results are abnormal or if the provider would like to make medication changes.   Follow-Up in: 2 months with Shawnee Dellen, FNP.  At the Advanced [facility] Failure Clinic, you and your health needs are our priority. We have a designated team specialized in the treatment of Heart Failure. This Care Team includes your primary Heart Failure Specialized Cardiologist (physician), Advanced Practice Providers (APPs- Physician Assistants and Nurse Practitioners), and Pharmacist who all work together to provide you with the care you need, when you need it.   You may see any of the following providers on your designated Care Team at your next follow up:  Dr. Jules Oar Dr. Peder Bourdon Dr. Alwin Baars Dr. Judyth Nunnery Shawnee Dellen, FNP Bevely Brush, RPH-CPP  Please be sure to bring in all your medications bottles to every appointment.   Need to Contact Us :  If you have any questions or concerns before your next appointment please send us  a message through [place] or call our office at [phone].    TO LEAVE A MESSAGE FOR THE NURSE SELECT OPTION 2, PLEASE LEAVE A MESSAGE INCLUDING: YOUR NAME DATE OF BIRTH CALL BACK NUMBER REASON FOR CALL**this is important as we prioritize the call backs  YOU WILL RECEIVE A CALL BACK THE SAME DAY AS LONG AS YOU CALL BEFORE 4:00 PM

## 2023-10-01 ENCOUNTER — Ambulatory Visit (INDEPENDENT_AMBULATORY_CARE_PROVIDER_SITE_OTHER): Admitting: Family Medicine

## 2023-10-01 ENCOUNTER — Encounter: Payer: Self-pay | Admitting: Family Medicine

## 2023-10-01 ENCOUNTER — Telehealth: Payer: Self-pay

## 2023-10-01 VITALS — BP 120/78 | HR 82 | Temp 97.9°F | Wt 140.8 lb

## 2023-10-01 DIAGNOSIS — W57XXXA Bitten or stung by nonvenomous insect and other nonvenomous arthropods, initial encounter: Secondary | ICD-10-CM

## 2023-10-01 DIAGNOSIS — S30860A Insect bite (nonvenomous) of lower back and pelvis, initial encounter: Secondary | ICD-10-CM | POA: Diagnosis not present

## 2023-10-01 DIAGNOSIS — I5022 Chronic systolic (congestive) heart failure: Secondary | ICD-10-CM | POA: Diagnosis not present

## 2023-10-01 DIAGNOSIS — I2510 Atherosclerotic heart disease of native coronary artery without angina pectoris: Secondary | ICD-10-CM

## 2023-10-01 DIAGNOSIS — I10 Essential (primary) hypertension: Secondary | ICD-10-CM

## 2023-10-01 MED ORDER — SPIRONOLACTONE 25 MG PO TABS: 12.5000 mg | ORAL_TABLET | Freq: Every day | ORAL | Status: DC

## 2023-10-01 NOTE — Progress Notes (Signed)
   Subjective:    Patient ID: Kathy Becker, female    DOB: 1936-04-11, 88 y.o.   MRN: [id number]  HPI Here for several issues. First she is concerned about elevated BP readings at home. These have been averaging 150/90 at home. She has fel fine with no headaches or chest pain or SOB. She recently saw Cardiology, and they reduced her Spironolactone  to 1/2 a tablet (12.5 mg) daily. She also takes Metoprolol  succinate 50 mg once daily and Valsartan  160 mg daily. In addition her daughter noticed a tick embedded in the skin of her lower back 10 days ago. She asks us  to check it. She denies any rashes or body aches or fever.    Review of Systems  Constitutional: Negative.   Respiratory: Negative.    Cardiovascular: Negative.   Skin:  Positive for wound.       Objective:   Physical Exam Constitutional:      Appearance: Normal appearance. She is not ill-appearing.  Cardiovascular:     Rate and Rhythm: Normal rate and regular rhythm.     Pulses: Normal pulses.     Heart sounds: Normal heart sounds.  Pulmonary:     Effort: Pulmonary effort is normal.     Breath sounds: Normal breath sounds.  Musculoskeletal:     Right lower leg: No edema.     Left lower leg: No edema.  Skin:    Comments: The remnants of an insect bite are seen in the center of the lower back. The area is not erythematous or warm or tender   Neurological:     Mental Status: She is alert and oriented to person, place, and time. Mental status is at baseline.           Assessment & Plan:  Her CHF and CAD seem to be stable, but her BP has been trending up. We will increase the Valsartan  to one and 1/2 tabs a day (total of 240 mg). She will report back in 2 weeks. She had a recent tick bite but she has had no ill effects from this and the site looks fine.  Corita Diego, MD

## 2023-10-01 NOTE — Telephone Encounter (Signed)
 Copied from CRM [id number]. Topic: Clinical - Medication Question >> Oct 01, 2023  4:50 PM Baldo Levan wrote: Reason for CRM: Patient called in stating that she saw Dr. Alyne Babinski today and forgot to mention her gout medication. Patient stated she needs a new medication for gout because she cant take the old one due to her heart issues and her Blood pressure going up.

## 2023-10-02 ENCOUNTER — Ambulatory Visit: Payer: Self-pay

## 2023-10-02 MED ORDER — COLCHICINE 0.6 MG PO TABS
0.6000 mg | ORAL_TABLET | Freq: Four times a day (QID) | ORAL | 2 refills | Status: AC | PRN
Start: 2023-10-02 — End: ?

## 2023-10-02 NOTE — Telephone Encounter (Signed)
 Pt.notified

## 2023-10-02 NOTE — Addendum Note (Signed)
 Addended by: Corita Diego A on: 10/02/2023 12:33 PM   Modules accepted: Orders

## 2023-10-02 NOTE — Telephone Encounter (Signed)
 I stopped the Indomethacin , and I sent in Colchicine she can use as needed

## 2023-10-02 NOTE — Telephone Encounter (Signed)
 FYI Only or Action Required?: FYI only for provider  Patient was last seen in primary care on 10/01/2023 by Donley Furth, MD. Called Nurse Triage reporting Medication change notification. Symptoms began chronic. Interventions attempted: Nothing. Symptoms are: stable.  Triage Disposition: Duplicate Contact Calls  Patient/caregiver understands and will follow disposition?:   Additional information: Returned call to patient, she has already been made aware of prescription change from office staff.     Copied from CRM [id number]. Topic: Clinical - Medication Question >> Oct 02, 2023 12:28 PM Shardie S wrote: Reason for CRM: Patient is requesting a new gout medication states she dislike the current gout medication that she is on. Reason for Disposition  Caller has already spoken with another triager and has no further questions.  Protocols used: No Contact or Duplicate Contact Call-A-AH

## 2023-10-03 NOTE — Telephone Encounter (Signed)
 New Rx was sent by Dr Alyne Babinski on 10/02/23

## 2023-10-08 ENCOUNTER — Other Ambulatory Visit: Payer: Self-pay | Admitting: Family

## 2023-10-28 ENCOUNTER — Ambulatory Visit: Payer: Self-pay | Admitting: Family

## 2023-10-28 ENCOUNTER — Other Ambulatory Visit
Admission: RE | Admit: 2023-10-28 | Discharge: 2023-10-28 | Disposition: A | Discharge status: Home / Self Care | Source: Ambulatory Visit | Attending: Family | Admitting: Family

## 2023-10-28 DIAGNOSIS — I5022 Chronic systolic (congestive) heart failure: Secondary | ICD-10-CM | POA: Diagnosis not present

## 2023-10-28 LAB — BASIC METABOLIC PANEL WITH GFR
Anion gap: 9 (ref 5–15)
BUN: 24 mg/dL — ABNORMAL HIGH (ref 8–23)
CO2: 21 mmol/L — ABNORMAL LOW (ref 22–32)
Calcium: 8.9 mg/dL (ref 8.9–10.3)
Chloride: 111 mmol/L (ref 98–111)
Creatinine, Ser: 1.56 mg/dL — ABNORMAL HIGH (ref 0.44–1.00)
GFR, Estimated: 32 mL/min — ABNORMAL LOW (ref >60)
Glucose, Bld: 117 mg/dL — ABNORMAL HIGH (ref 70–99)
Potassium: 4.4 mmol/L (ref 3.5–5.1)
Sodium: 141 mmol/L (ref 135–145)

## 2023-11-06 DIAGNOSIS — H31013 Macula scars of posterior pole (postinflammatory) (post-traumatic), bilateral: Secondary | ICD-10-CM | POA: Diagnosis not present

## 2023-12-02 ENCOUNTER — Telehealth: Payer: Self-pay | Admitting: Family

## 2023-12-02 NOTE — Telephone Encounter (Signed)
 Called to confirm/remind patient of their appointment at the Advanced [facility] Failure Clinic on 12/03/23.   Appointment:   [x] Confirmed  [] Left mess   [] No answer/No voice mail  [] VM Full/unable to leave message  [] Phone not in service  Patient reminded to bring all medications and/or complete list.  Confirmed patient has transportation. Gave directions, instructed to utilize valet parking.

## 2023-12-02 NOTE — Progress Notes (Unsigned)
 Advanced Heart Failure Clinic Note    PCP: Johnny Senior, MD (last seen 03/25) Primary Cardiologist: None  Chief Complaint: shortness of breath   HPI:  Kathy Becker is a 88 y/o female with a history of  CAD (MI), hyperlipidemia, HTN, CKD, tobacco use and chronic heart failure.   Echo 06/23/2018: EF 40-45% along with elevated mean left atrial pressure  Was in the ED 06/28/22 due to acute on chronic heart failure. IV lasix  given w/ improvement of symptoms.   Echo 08/08/22: EF 25-30% along with mild LAE and moderate/ severe MR.   Admitted 10/28/22 due to acute onset of worsening dyspnea as well as lower extremity edema. IV diuresed and transitioned to oral diuretics with improvement of symptoms.   Echo 04/18/23: EF 35-40% with Grade I DD  Was seen in the HF clinic 12/24 and had metoprolol  succinate increased to 50mg  daily.   Seen in [facility] 05/25 and after lab results obtained, she was called and told to hold spiro for 2 days and then resume at 12.5mg  daily. Instructed to get labs repeated either with nephrology or come to the [facility].   She presents today for a HF f/u visit with a chief complaint of shortness of breath. For last 3-4 days, has felt dizzy and weak. Denies chest pain, cough, palpitations, abdominal distention, pedal edema or difficulty sleeping. Upon reviewing meds, she says that she has continued taking the 25mg  spironolactone  tablet instead of 1/2 tablet. She says that she saw nephrology ~ 2 weeks ago who recommended stoppage but she, again, has continued taking it. (No nephrology note available at this time). Did not get repeat labs drawn.   ROS: All systems negative except as listed in HPI, PMH and Problem List.  SH:  Social History   Socioeconomic History   Marital status: Legally Separated    Spouse name: Not on file   Number of children: Not on file   Years of education: Not on file   Highest education level: 12th grade  Occupational History   Not on file   Tobacco Use   Smoking status: Former    Passive exposure: Never   Smokeless tobacco: Never  Vaping Use   Vaping status: Never Used  Substance and Sexual Activity   Alcohol use: No    Alcohol/week: 0.0 standard drinks of alcohol   Drug use: No   Sexual activity: Not on file  Other Topics Concern   Not on file  Social History Narrative   Not on file   Social Drivers of Health   Financial Resource Strain: Low Risk  (07/14/2023)   Overall Financial Resource Strain (CARDIA)    Difficulty of Paying Living Expenses: Not very hard  Food Insecurity: Unknown (07/14/2023)   Hunger Vital Sign    Worried About Running Out of Food in the Last Year: Never true    Ran Out of Food in the Last Year: Patient declined  Transportation Needs: No Transportation Needs (07/14/2023)   PRAPARE - Administrator, Civil Service (Medical): No    Lack of Transportation (Non-Medical): No  Physical Activity: Insufficiently Active (07/14/2023)   Exercise Vital Sign    Days of Exercise per Week: 5 days    Minutes of Exercise per Session: 10 min  Stress: No Stress Concern Present (07/14/2023)   Kathy Becker of Occupational Health - Occupational Stress Questionnaire    Feeling of Stress : Not at all  Social Connections: Moderately Integrated (07/14/2023)   Social Connection and  Isolation Panel    Frequency of Communication with Friends and Family: More than three times a week    Frequency of Social Gatherings with Friends and Family: Three times a week    Attends Religious Services: More than 4 times per year    Active Member of Clubs or Organizations: Yes    Attends Banker Meetings: More than 4 times per year    Marital Status: Widowed  Intimate Partner Violence: Not At Risk (04/08/2023)   Humiliation, Afraid, Rape, and Kick questionnaire    Fear of Current or Ex-Partner: No    Emotionally Abused: No    Physically Abused: No    Sexually Abused: No    FH:  Family History   Problem Relation Age of Onset   Coronary artery disease Other     Past Medical History:  Diagnosis Date   CAD (coronary artery disease)    stress test 08-15-08 showing lateral scar but no reversible ischemia    CHF (congestive heart failure) (HCC)    Hyperlipidemia    Hypertension    Myocardial infarction [facility])    Renal insufficiency     Current Outpatient Medications  Medication Sig Dispense Refill   aspirin  EC 81 MG tablet Take 81 mg by mouth at bedtime.     colchicine  0.6 MG tablet Take 1 tablet (0.6 mg total) by mouth every 6 (six) hours as needed (gout flares). 60 tablet 2   FARXIGA  10 MG TABS tablet TAKE 1 TABLET(10 MG) BY MOUTH DAILY BEFORE BREAKFAST 30 tablet 6   metoprolol  succinate (TOPROL -XL) 50 MG 24 hr tablet Take 1 tablet (50 mg total) by mouth daily. Take with or immediately following a meal. 90 tablet 3   omeprazole  (PRILOSEC) 40 MG capsule TAKE 1 CAPSULE(40 MG) BY MOUTH DAILY 30 capsule 11   pravastatin  (PRAVACHOL ) 40 MG tablet Take 1 tablet (40 mg total) by mouth daily. 30 tablet 11   spironolactone  (ALDACTONE ) 25 MG tablet Take 0.5 tablets (12.5 mg total) by mouth daily.     valsartan  (DIOVAN ) 160 MG tablet TAKE 1 TABLET(160 MG) BY MOUTH DAILY 30 tablet 11   No current facility-administered medications for this visit.   There were no vitals filed for this visit.  Wt Readings from Last 3 Encounters:  10/01/23 140 lb 12.8 oz (63.9 kg)  09/30/23 141 lb (64 kg)  08/28/23 145 lb (65.8 kg)   Lab Results  Component Value Date   CREATININE 1.56 (H) 10/28/2023   CREATININE 1.59 (H) 09/30/2023   CREATININE 1.81 (H) 08/28/2023    EKG: NSR, HR 77 without peaked T waves (personally reviewed)   PHYSICAL EXAM:  General: Well appearing. No resp difficulty HEENT: normal Neck: supple, no JVD Cor: Regular rhythm, rate. No rubs, gallops or murmurs Lungs: clear Abdomen: soft, nontender, nondistended. Extremities: no cyanosis, clubbing, rash, edema Neuro: alert &  oriented X 3. Moves all 4 extremities w/o difficulty. Affect pleasant   ASSESSMENT & PLAN:  1: NICM with reduced ejection fraction- - etiology likely HTN - NYHA class II - euvolemic - weight down 4 pounds from last visit here 1 month ago - Echo 06/23/2018: EF 40-45% along with elevated mean left atrial pressure - Echo 08/08/22: EF 25-30% along with mild LAE and moderate/ severe MR.  - Echo 04/18/23: EF 35-40% with Grade I DD - adds salt on occasion to foods like potatoes/ tomatoes; still likes to eat out at [employer] and plans to continue - continue farxiga  10mg  daily -  continue metoprolol  succinate 50mg  daily  - continue furosemide  20mg  daily PRN; emphasized weighing daily and taking furosemide  if gains >2 pounds overnight - continue valsartan  160mg  daily  - continue spironolactone  25mg  daily as she's been taking this dose since last visit (was supposed to have held it X 2 days and then decrease it) - EKG today to make sure no peaked T waves. NSR today - BMET today - discussed referral to cardiologist but she would like to wait on this for now; saw a cardiologist at [facility] on [address] ~ 2010 - BNP 10/28/22 was 926.5  2: HTN w/ CKD- - BP 147/89 - if needs additional agent, will need once daily dosing as she says that BID dosing will not work for her; isn't particularly interested in taking any new medications - saw PCP Kathy Becker) 03/25 - BMP 07/15/23 reviewed: sodium 141, potassium 5.0, creatinine 1.82 and GFR 24.92 - she reports seeing nephrology a couple of weeks ago but no note is viewable today - BMET today  3: Carotid stenosis- - right ICA stenosis measuring 40 to 59%, left without stenosis.  - saw vascular ([facility]) 02/24 - AAA 3.2 cm  4: Hyperlipidemia- - lipid panel 07/15/23: LDL 152, cholesterol 233 - continue pravastatin  40mg  daily (started 03/25)   Return in 2 months, sooner if needed.   Ellouise DELENA Class, FNP 12/02/23

## 2023-12-03 ENCOUNTER — Ambulatory Visit: Admitting: Family

## 2023-12-03 ENCOUNTER — Other Ambulatory Visit
Admission: RE | Admit: 2023-12-03 | Discharge: 2023-12-03 | Disposition: A | Discharge status: Home / Self Care | Source: Ambulatory Visit | Attending: Family | Admitting: Family

## 2023-12-03 ENCOUNTER — Encounter: Payer: Self-pay | Admitting: Family

## 2023-12-03 ENCOUNTER — Ambulatory Visit: Payer: Self-pay | Admitting: Family

## 2023-12-03 VITALS — BP 153/86 | HR 73 | Wt 142.0 lb

## 2023-12-03 DIAGNOSIS — I5022 Chronic systolic (congestive) heart failure: Secondary | ICD-10-CM | POA: Insufficient documentation

## 2023-12-03 DIAGNOSIS — E782 Mixed hyperlipidemia: Secondary | ICD-10-CM | POA: Diagnosis not present

## 2023-12-03 DIAGNOSIS — I6521 Occlusion and stenosis of right carotid artery: Secondary | ICD-10-CM | POA: Diagnosis not present

## 2023-12-03 DIAGNOSIS — I10 Essential (primary) hypertension: Secondary | ICD-10-CM | POA: Diagnosis not present

## 2023-12-03 LAB — BASIC METABOLIC PANEL WITH GFR
Anion gap: 9 (ref 5–15)
BUN: 25 mg/dL — ABNORMAL HIGH (ref 8–23)
CO2: 19 mmol/L — ABNORMAL LOW (ref 22–32)
Calcium: 8.7 mg/dL — ABNORMAL LOW (ref 8.9–10.3)
Chloride: 111 mmol/L (ref 98–111)
Creatinine, Ser: 1.6 mg/dL — ABNORMAL HIGH (ref 0.44–1.00)
GFR, Estimated: 31 mL/min — ABNORMAL LOW (ref >60)
Glucose, Bld: 149 mg/dL — ABNORMAL HIGH (ref 70–99)
Potassium: 4.7 mmol/L (ref 3.5–5.1)
Sodium: 139 mmol/L (ref 135–145)

## 2023-12-03 MED ORDER — VALSARTAN 320 MG PO TABS: 320.0000 mg | ORAL_TABLET | Freq: Every day | ORAL | 5 refills | Status: DC

## 2023-12-03 NOTE — Patient Instructions (Signed)
 Medication Changes:  No medication changes today!  Lab Work:  Go over to the [facility]ICAL [facility]. Go pass the gift shop and have your blood work completed.  We will only call you if the results are abnormal or if the provider would like to make medication changes.    Testing/Procedures:  Your physician has requested that you have an echocardiogram. Echocardiography is a painless test that uses sound waves to create images of your heart. It provides your doctor with information about the size and shape of your heart and how well your heart's chambers and valves are working. This procedure takes approximately one hour. There are no restrictions for this procedure. Please do NOT wear cologne, perfume, aftershave, or lotions (deodorant is allowed). Please arrive 15 minutes prior to your appointment time.  Please note: We ask at that you not bring children with you during ultrasound (echo/ vascular) testing. Due to room size and safety concerns, children are not allowed in the ultrasound rooms during exams. Our front office staff cannot provide observation of children in our lobby area while testing is being conducted. An adult accompanying a patient to their appointment will only be allowed in the ultrasound room at the discretion of the ultrasound technician under special circumstances. We apologize for any inconvenience.   Follow-Up in: Please follow up with the Advanced Heart Failure Clinic in 2 months with Ellouise Class, FNP. This appointment will be scheduled 1 week after your echocardiogram.    Thank you for choosing [facility].    At the [facility] Clinic, you and your health needs are our priority. We have a designated team specialized in the treatment of Heart Failure. This Care Team includes your primary Heart Failure Specialized Cardiologist (physician), Advanced Practice Providers (APPs- Physician Assistants and Nurse Practitioners), and  Pharmacist who all work together to provide you with the care you need, when you need it.   You may see any of the following providers on your designated Care Team at your next follow up:  Dr. Toribio Fuel Dr. Ezra Shuck Dr. Ria Commander Dr. Morene Brownie Ellouise Class, FNP Jaun Bash, RPH-CPP  Please be sure to bring in all your medications bottles to every appointment.   Need to Contact Us :  If you have any questions or concerns before your next appointment please send us  a message through [place] or call our office at [phone].    TO LEAVE A MESSAGE FOR THE NURSE SELECT OPTION 2, PLEASE LEAVE A MESSAGE INCLUDING: YOUR NAME DATE OF BIRTH CALL BACK NUMBER REASON FOR CALL**this is important as we prioritize the call backs  YOU WILL RECEIVE A CALL BACK THE SAME DAY AS LONG AS YOU CALL BEFORE 4:00 PM

## 2023-12-10 ENCOUNTER — Ambulatory Visit: Payer: Self-pay | Admitting: Family

## 2023-12-10 ENCOUNTER — Other Ambulatory Visit
Admission: RE | Admit: 2023-12-10 | Discharge: 2023-12-10 | Disposition: A | Discharge status: Home / Self Care | Source: Ambulatory Visit | Attending: Family | Admitting: Family

## 2023-12-10 DIAGNOSIS — I5022 Chronic systolic (congestive) heart failure: Secondary | ICD-10-CM | POA: Insufficient documentation

## 2023-12-10 LAB — BASIC METABOLIC PANEL WITH GFR
Anion gap: 10 (ref 5–15)
BUN: 26 mg/dL — ABNORMAL HIGH (ref 8–23)
CO2: 21 mmol/L — ABNORMAL LOW (ref 22–32)
Calcium: 9 mg/dL (ref 8.9–10.3)
Chloride: 109 mmol/L (ref 98–111)
Creatinine, Ser: 1.64 mg/dL — ABNORMAL HIGH (ref 0.44–1.00)
GFR, Estimated: 30 mL/min — ABNORMAL LOW (ref >60)
Glucose, Bld: 131 mg/dL — ABNORMAL HIGH (ref 70–99)
Potassium: 4.4 mmol/L (ref 3.5–5.1)
Sodium: 140 mmol/L (ref 135–145)

## 2023-12-15 ENCOUNTER — Other Ambulatory Visit: Payer: Self-pay | Admitting: Family

## 2024-02-02 ENCOUNTER — Ambulatory Visit: Payer: Self-pay | Admitting: Family

## 2024-02-02 ENCOUNTER — Ambulatory Visit
Admission: RE | Admit: 2024-02-02 | Discharge: 2024-02-02 | Disposition: A | Discharge status: Home / Self Care | Source: Ambulatory Visit | Attending: Family | Admitting: Family

## 2024-02-02 DIAGNOSIS — I340 Nonrheumatic mitral (valve) insufficiency: Secondary | ICD-10-CM | POA: Insufficient documentation

## 2024-02-02 DIAGNOSIS — I5022 Chronic systolic (congestive) heart failure: Secondary | ICD-10-CM | POA: Diagnosis not present

## 2024-02-02 LAB — ECHOCARDIOGRAM COMPLETE
AR max vel: 1.43 cm2
AV Area VTI: 1.31 cm2
AV Area mean vel: 1.26 cm2
AV Mean grad: 4 mmHg
AV Peak grad: 7.6 mmHg
Ao pk vel: 1.38 m/s
Area-P 1/2: 2.99 cm2
Calc EF: 41 %
MV M vel: 6.41 m/s
MV Peak grad: 164.4 mmHg
MV VTI: 1.25 cm2
Radius: 0.55 cm
S' Lateral: 5.1 cm
Single Plane A2C EF: 47.9 %
Single Plane A4C EF: 35.6 %

## 2024-02-10 DIAGNOSIS — Z23 Encounter for immunization: Secondary | ICD-10-CM | POA: Diagnosis not present

## 2024-02-16 ENCOUNTER — Encounter: Admitting: Family

## 2024-02-17 ENCOUNTER — Telehealth: Payer: Self-pay | Admitting: Family

## 2024-02-17 NOTE — Progress Notes (Unsigned)
 Advanced Heart Failure Clinic Note    PCP: Johnny Senior, MD  Primary Cardiologist: None  Chief Complaint: fatigue   HPI:  Kathy Becker is a 88 y/o female with a history of  CAD (MI), hyperlipidemia, HTN, CKD, tobacco use and chronic heart failure.   Echo 06/23/2018: EF 40-45% along with elevated mean left atrial pressure  Was in the ED 06/28/22 due to acute on chronic heart failure. IV lasix  given w/ improvement of symptoms.   Echo 08/08/22: EF 25-30% along with mild LAE and moderate/ severe MR.   Admitted 10/28/22 due to acute onset of worsening dyspnea as well as lower extremity edema. IV diuresed and transitioned to oral diuretics with improvement of symptoms.   Echo 04/18/23: EF 35-40% with Grade I DD  Was seen in the HF clinic 12/24 and had metoprolol  succinate increased to 50mg  daily.   Seen in [facility] 05/25 and after lab results obtained, she was called and told to hold spiro for 2 days and then resume at 12.5mg  daily. Instructed to get labs repeated either with nephrology or come [facility].   PCP increased valsartan  to 240mg  daily at visit 06/25.  She presents today for a HF f/u visit with a chief complaint of minimal fatigue. Denies shortness of breath, chest pain, palpitations, dizziness or edema. She says that she has continued taking spironolactone  25mg  daiy as she couldn't cut the pill in half due to size. She also says that she continued taking 160mg  valsartan  daily because she didn't want to take an extra half tablet because she was afraid that she would run out of the medication.   ROS: All systems negative except as listed in HPI, PMH and Problem List.  SH:  Social History   Socioeconomic History   Marital status: Legally Separated    Spouse name: Not on file   Number of children: Not on file   Years of education: Not on file   Highest education level: 12th grade  Occupational History   Not on file  Tobacco Use   Smoking status: Former    Passive  exposure: Never   Smokeless tobacco: Never  Vaping Use   Vaping status: Never Used  Substance and Sexual Activity   Alcohol use: No    Alcohol/week: 0.0 standard drinks of alcohol   Drug use: No   Sexual activity: Not on file  Other Topics Concern   Not on file  Social History Narrative   Not on file   Social Drivers of Health   Financial Resource Strain: Low Risk  (07/14/2023)   Overall Financial Resource Strain (CARDIA)    Difficulty of Paying Living Expenses: Not very hard  Food Insecurity: Unknown (07/14/2023)   Hunger Vital Sign    Worried About Running Out of Food in the Last Year: Never true    Ran Out of Food in the Last Year: Patient declined  Transportation Needs: No Transportation Needs (07/14/2023)   PRAPARE - Administrator, Civil Service (Medical): No    Lack of Transportation (Non-Medical): No  Physical Activity: Insufficiently Active (07/14/2023)   Exercise Vital Sign    Days of Exercise per Week: 5 days    Minutes of Exercise per Session: 10 min  Stress: No Stress Concern Present (07/14/2023)   [employer] of Occupational Health - Occupational Stress Questionnaire    Feeling of Stress : Not at all  Social Connections: Moderately Integrated (07/14/2023)   Social Connection and Isolation Panel  Frequency of Communication with Friends and Family: More than three times a week    Frequency of Social Gatherings with Friends and Family: Three times a week    Attends Religious Services: More than 4 times per year    Active Member of Clubs or Organizations: Yes    Attends Banker Meetings: More than 4 times per year    Marital Status: Widowed  Intimate Partner Violence: Not At Risk (04/08/2023)   Humiliation, Afraid, Rape, and Kick questionnaire    Fear of Current or Ex-Partner: No    Emotionally Abused: No    Physically Abused: No    Sexually Abused: No    FH:  Family History  Problem Relation Age of Onset   Coronary artery  disease Other     Past Medical History:  Diagnosis Date   CAD (coronary artery disease)    stress test 08-15-08 showing lateral scar but no reversible ischemia    CHF (congestive heart failure) (HCC)    Hyperlipidemia    Hypertension    Myocardial infarction [facility])    Renal insufficiency     Current Outpatient Medications  Medication Sig Dispense Refill   aspirin  EC 81 MG tablet Take 81 mg by mouth at bedtime. (Patient taking differently: Take 81 mg by mouth daily as needed.)     colchicine  0.6 MG tablet Take 1 tablet (0.6 mg total) by mouth every 6 (six) hours as needed (gout flares). 60 tablet 2   FARXIGA  10 MG TABS tablet TAKE 1 TABLET(10 MG) BY MOUTH DAILY BEFORE BREAKFAST 30 tablet 6   metoprolol  succinate (TOPROL -XL) 50 MG 24 hr tablet Take 1 tablet (50 mg total) by mouth daily. Take with or immediately following a meal. 90 tablet 3   omeprazole  (PRILOSEC) 40 MG capsule TAKE 1 CAPSULE(40 MG) BY MOUTH DAILY 30 capsule 11   pravastatin  (PRAVACHOL ) 40 MG tablet Take 1 tablet (40 mg total) by mouth daily. 30 tablet 11   spironolactone  (ALDACTONE ) 25 MG tablet Take 25 mg by mouth daily.     valsartan  (DIOVAN ) 320 MG tablet Take 1 tablet (320 mg total) by mouth daily. 30 tablet 5   No current facility-administered medications for this visit.   There were no vitals filed for this visit.  Wt Readings from Last 3 Encounters:  12/03/23 142 lb (64.4 kg)  10/01/23 140 lb 12.8 oz (63.9 kg)  09/30/23 141 lb (64 kg)   Lab Results  Component Value Date   CREATININE 1.64 (H) 12/10/2023   CREATININE 1.60 (H) 12/03/2023   CREATININE 1.56 (H) 10/28/2023    EKG: not done   PHYSICAL EXAM:  General: Well appearing.  Cor: No JVD. Regular rhythm, rate.  Lungs: clear Abdomen: soft, nontender, nondistended. Extremities: no edema Neuro:. Affect pleasant   ASSESSMENT & PLAN:  1: NICM with reduced ejection fraction- - etiology likely HTN - NYHA class II - euvolemic - weight stable  from last visit here 2 months ago - Echo 06/23/2018: EF 40-45% along with elevated mean left atrial pressure - Echo 08/08/22: EF 25-30% along with mild LAE and moderate/ severe MR.  - Echo 04/18/23: EF 35-40% with Grade I DD - will get echo updated now that she's on all GDMT - adds salt on occasion to foods like potatoes/ tomatoes; still likes to eat out at [employer] and plans to continue - continue farxiga  10mg  daily - continue metoprolol  succinate 50mg  daily  - continue furosemide  20mg  daily PRN; emphasized weighing daily and taking  furosemide  if gains >2 pounds overnight - continue valsartan  180mg  daily; will get BMET results today and possibly titrate valsartan  to 320mg  daily - continue spironolactone  25mg  daily as she had difficulty cutting tablet in half - BMET today - discussed referral to cardiologist but she would like to wait on this for now; saw a cardiologist at [facility] on [address] ~ 2010 - BNP 10/28/22 was 926.5  2: HTN w/ CKD- - BP 156/100; repeated at end of visit was 153/86 - discussed increasing valsartan  to 320mg  after getting lab results back - saw PCP [facility]) 06/25 - BMP 10/28/23 reviewed: sodium 141, potassium 4.4, creatinine 1.56 and GFR 32 - BMET today  3: Carotid stenosis- - right ICA stenosis measuring 40 to 59%, left without stenosis.  - saw vascular ([facility]) 02/24 - AAA 3.2 cm  4: Hyperlipidemia- - lipid panel 07/15/23: LDL 152, cholesterol 233 - continue pravastatin  40mg  daily (started 03/25)   Return in 2 months after echo, sooner if needed.   Ellouise DELENA Class, FNP 02/17/24

## 2024-02-17 NOTE — Telephone Encounter (Signed)
 Called to confirm/remind patient of their appointment at the Advanced Heart Failure Clinic on 02/18/24.   Appointment:   [x] Confirmed  [] Left mess   [] No answer/No voice mail  [] VM Full/unable to leave message  [] Phone not in service  Patient reminded to bring all medications and/or complete list.  Confirmed patient has transportation. Gave directions, instructed to utilize valet parking.

## 2024-02-18 ENCOUNTER — Encounter: Payer: Self-pay | Admitting: Family

## 2024-02-18 ENCOUNTER — Ambulatory Visit (HOSPITAL_BASED_OUTPATIENT_CLINIC_OR_DEPARTMENT_OTHER): Admitting: Family

## 2024-02-18 ENCOUNTER — Other Ambulatory Visit
Admission: RE | Admit: 2024-02-18 | Discharge: 2024-02-18 | Disposition: A | Discharge status: Home / Self Care | Source: Ambulatory Visit | Attending: Family | Admitting: Family

## 2024-02-18 ENCOUNTER — Ambulatory Visit: Payer: Self-pay | Admitting: Family

## 2024-02-18 VITALS — BP 128/80 | HR 60 | Wt 142.0 lb

## 2024-02-18 DIAGNOSIS — Z87891 Personal history of nicotine dependence: Secondary | ICD-10-CM | POA: Insufficient documentation

## 2024-02-18 DIAGNOSIS — I10 Essential (primary) hypertension: Secondary | ICD-10-CM | POA: Diagnosis not present

## 2024-02-18 DIAGNOSIS — I6521 Occlusion and stenosis of right carotid artery: Secondary | ICD-10-CM | POA: Insufficient documentation

## 2024-02-18 DIAGNOSIS — Z8249 Family history of ischemic heart disease and other diseases of the circulatory system: Secondary | ICD-10-CM | POA: Insufficient documentation

## 2024-02-18 DIAGNOSIS — F40240 Claustrophobia: Secondary | ICD-10-CM | POA: Insufficient documentation

## 2024-02-18 DIAGNOSIS — I6522 Occlusion and stenosis of left carotid artery: Secondary | ICD-10-CM | POA: Diagnosis not present

## 2024-02-18 DIAGNOSIS — I5022 Chronic systolic (congestive) heart failure: Secondary | ICD-10-CM

## 2024-02-18 DIAGNOSIS — I714 Abdominal aortic aneurysm, without rupture, unspecified: Secondary | ICD-10-CM | POA: Insufficient documentation

## 2024-02-18 DIAGNOSIS — I252 Old myocardial infarction: Secondary | ICD-10-CM | POA: Insufficient documentation

## 2024-02-18 DIAGNOSIS — I2510 Atherosclerotic heart disease of native coronary artery without angina pectoris: Secondary | ICD-10-CM | POA: Insufficient documentation

## 2024-02-18 DIAGNOSIS — Z79899 Other long term (current) drug therapy: Secondary | ICD-10-CM | POA: Insufficient documentation

## 2024-02-18 DIAGNOSIS — E785 Hyperlipidemia, unspecified: Secondary | ICD-10-CM | POA: Insufficient documentation

## 2024-02-18 DIAGNOSIS — I428 Other cardiomyopathies: Secondary | ICD-10-CM | POA: Diagnosis not present

## 2024-02-18 DIAGNOSIS — I130 Hypertensive heart and chronic kidney disease with heart failure and stage 1 through stage 4 chronic kidney disease, or unspecified chronic kidney disease: Secondary | ICD-10-CM | POA: Insufficient documentation

## 2024-02-18 DIAGNOSIS — N189 Chronic kidney disease, unspecified: Secondary | ICD-10-CM | POA: Insufficient documentation

## 2024-02-18 LAB — LIPID PANEL
Cholesterol: 241 mg/dL — ABNORMAL HIGH (ref 0–200)
HDL: 57 mg/dL (ref >40)
LDL Cholesterol: 156 mg/dL — ABNORMAL HIGH (ref 0–99)
Total CHOL/HDL Ratio: 4.2 ratio
Triglycerides: 141 mg/dL (ref <150)
VLDL: 28 mg/dL (ref 0–40)

## 2024-02-18 LAB — BASIC METABOLIC PANEL WITH GFR
Anion gap: 13 (ref 5–15)
BUN: 24 mg/dL — ABNORMAL HIGH (ref 8–23)
CO2: 20 mmol/L — ABNORMAL LOW (ref 22–32)
Calcium: 9.2 mg/dL (ref 8.9–10.3)
Chloride: 109 mmol/L (ref 98–111)
Creatinine, Ser: 1.57 mg/dL — ABNORMAL HIGH (ref 0.44–1.00)
GFR, Estimated: 32 mL/min — ABNORMAL LOW (ref >60)
Glucose, Bld: 119 mg/dL — ABNORMAL HIGH (ref 70–99)
Potassium: 4.9 mmol/L (ref 3.5–5.1)
Sodium: 142 mmol/L (ref 135–145)

## 2024-02-18 MED ORDER — DIAZEPAM 5 MG PO TABS
5.0000 mg | ORAL_TABLET | Freq: Once | ORAL | 0 refills | Status: DC
Start: 2024-02-18 — End: 2024-02-18

## 2024-02-18 NOTE — Patient Instructions (Addendum)
 It was good to see you today!  Medication Changes:  No medication changes today!  Lab Work:   Go downstairs to [place] on [address]OWER [address]EV[address] to have your blood work completed.  We will only call you if the results are abnormal or if the provider would like to make medication changes.  No news is good news.     Testing/Procedures:    [facility] [address] [place], [place] [address] Please go to the [facility] and check-in with the desk attendant.   Magnetic resonance imaging (MRI) is a painless test that produces images of the inside of the body without using Xrays.  During an MRI, strong magnets and radio waves work together in a Data processing manager to form detailed images.   MRI images may provide more details about a medical condition than X-rays, CT scans, and ultrasounds can provide.  You may be given earphones to listen for instructions.  You may eat a light breakfast and take medications as ordered with the exception of furosemide , hydrochlorothiazide, chlorthalidone or spironolactone  (or any other fluid pill). If you are undergoing a stress MRI, please avoid stimulants for 12 hr prior to test. (I.e. Caffeine, nicotine, chocolate, or antihistamine medications)  If your provider has ordered anti-anxiety medications for this test, then you will need a driver.  An IV will be inserted into one of your veins. Contrast material will be injected into your IV. It will leave your body through your urine within a day. You may be told to drink plenty of fluids to help flush the contrast material out of your system.  You will be asked to remove all metal, including: Watch, jewelry, and other metal objects including hearing aids, hair pieces and dentures. Also wearable glucose monitoring systems (ie. Freestyle Libre and Omnipods) (Braces and fillings normally are not a problem.)   TEST WILL TAKE APPROXIMATELY 1 HOUR  PLEASE NOTIFY SCHEDULING AT LEAST 24  HOURS IN ADVANCE IF YOU ARE UNABLE TO KEEP YOUR APPOINTMENT. [phone]  For more information and frequently asked questions, please visit our website : [email or web address]  Please call the Cardiac Imaging Nurse Navigators with any questions/concerns. [phone] Office      Follow-Up in: Please follow up with the Advanced Heart Failure Clinic in 3 months with Ellouise Class, FNP.   Thank you for choosing [facility] Advanced Heart Failure [facility].    At the Advanced Heart Failure Clinic, you and your health needs are our priority. We have a designated team specialized in the treatment of Heart Failure. This Care Team includes your primary Heart Failure Specialized Cardiologist (physician), Advanced Practice Providers (APPs- Physician Assistants and Nurse Practitioners), and Pharmacist who all work together to provide you with the care you need, when you need it.   You may see any of the following providers on your designated Care Team at your next follow up:  Dr. Toribio Fuel Dr. Ezra Shuck Dr. Ria Commander Dr. Morene Brownie Ellouise Class, FNP Jaun Bash, RPH-CPP  Please be sure to bring in all your medications bottles to every appointment.   Need to Contact Us :  If you have any questions or concerns before your next appointment please send us  a message through [place] or call our office at [phone].    TO LEAVE A MESSAGE FOR THE NURSE SELECT OPTION 2, PLEASE LEAVE A MESSAGE INCLUDING: YOUR NAME DATE OF BIRTH CALL BACK NUMBER REASON FOR CALL**this is important as we prioritize the call backs  YOU  WILL RECEIVE A CALL BACK THE SAME DAY AS LONG AS YOU CALL BEFORE 4:00 PM

## 2024-02-18 NOTE — Progress Notes (Signed)
 Pt refused the cardiac mri even with offering an anti anxiety medication. Order canceled. Prescription shredded. No further questions at this time.

## 2024-02-19 MED ORDER — ROSUVASTATIN CALCIUM 20 MG PO TABS: 20.0000 mg | ORAL_TABLET | Freq: Every day | ORAL | 3 refills | Status: DC

## 2024-02-19 NOTE — Telephone Encounter (Addendum)
 Pt aware and agreeable to trying rosuvastatin 20 mg daily. She states she is afraid it won't agree with me. Instructed patient to call clinic if she experiences any new or worsening side effects. Rx sent to patient's requested pharmacy.  Pravastatin  discontinued.  ----- Message from Ellouise DELENA Class sent at 02/18/2024  3:35 PM EDT ----- Kidney function improved. Cholesterol and LDL (bad cholesterol) are elevated and haven't changed since earlier this year.  For better lowering effects, stop pravastatin  and begin rosuvastatin 20mg  daily.  ----- Message ----- From: Interface, Lab In [place] Sent: 02/18/2024  11:30 AM EDT To: Ellouise DELENA Class, FNP

## 2024-03-05 NOTE — Progress Notes (Signed)
 Cardiology Office Note   Date:  03/09/2024  ID:  Kathy Becker, DOB 16-Apr-1936, MRN [id number] PCP: Johnny Garnette LABOR, MD  [facility] Providers Cardiologist:  Caron Poser, MD     History of Present Illness Kathy Becker is a 88 y.o. female PMH reported MI in 1991, HLD, chronic HFrEF, AAA, who presents to establish care.  Patient reports she is overall doing well.  She has good functional status and is going up and down her driveway several times per day which she notes is about half a mile.  She only occasionally is limited by shortness of breath.  She is still able to do all of the things that she wants to do and even things such as bringing the trash cans to and from the end of the driveway.  She is not having frequent heart failure hospitalizations or any other issues.  Denies orthopnea.  No chest discomfort.  Last LDL 156 01/2024.  Relevant CVD History -TTE 01/2024 LVEF 35 to 40%, grade 1 diastolic dysfunction, normal RV function, moderate MR - TTE 03/2023 LVEF 35 to 40% with trivial MR - TTE 07/2022 LVEF 25 to 30% -AAA duplex 05/2022 3.1 cm dilation of distal aorta which has been unchanged since 2021   ROS: Pt denies any chest discomfort, jaw pain, arm pain, palpitations, syncope, presyncope, orthopnea, PND, or LE edema.  Studies Reviewed I have independently reviewed the patient's ECG, previous cardiac testing, recent medical records, recent blood work.  Physical Exam VS:  BP 118/72 (BP Location: Right Arm, Patient Position: Sitting, Cuff Size: Normal)   Pulse 68   Ht 5' 1 (1.549 m)   Wt 143 lb (64.9 kg)   SpO2 98%   BMI 27.02 kg/m        Wt Readings from Last 3 Encounters:  03/09/24 143 lb (64.9 kg)  02/18/24 142 lb (64.4 kg)  12/03/23 142 lb (64.4 kg)    GEN: No acute distress. NECK: No JVD; No carotid bruits. CARDIAC: RRR, no murmurs, rubs, gallops. RESPIRATORY:  Clear to auscultation. EXTREMITIES:  Warm and well-perfused. No edema.  ASSESSMENT AND  PLAN CAD with remote MI in the 1990s Chronic HFrEF, likely ischemic Patient presents for further evaluation management of chronic ischemic cardiomyopathy from a remote MI in the 90s which was reportedly treated with angioplasty.  She is doing remarkably well from a symptomatic standpoint and is NYHA I-II at the worst.  She has not had any recent heart failure hospitalizations and has not had any other heart failure decompensations requiring diuretic therapy.  She appears euvolemic on exam.  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  I discussed pursuing an ischemic evaluation, though she says she is not interested since she is doing quite well all things considered.  Since this is likely ischemic cardiomyopathy, her wall motion abnormality likely represents chronic infarct anyways, and would not be amenable to revascularization.  Cannot completely rule out hibernating myocardium, though she is not interested in pursuing this at this time.  Plan: - Continue ASA 81 mg daily - Continue metoprolol  XL 50 mg daily - Continue Farxiga  10 mg daily - Continue valsartan  320 mg daily - Continue spironolactone  25 mg daily - ICD therapy not indicated - As above, not interested in pursuing ischemic evaluation which is reasonable given her good functional status and age.  She  has done quite well all things considered. - I advised her to check daily standing weights, her dry weight is in the 142 to 144 pound range.  If she gains more than 3 pounds in a day or 5 pounds in a week, advised her to reach out to either myself or her heart failure providers for further guidance.  HLD Statin intolerance Poorly controlled.  Last LDL 156 01/2024.  She is statin intolerant and has tried multiple agents.  I recommended starting Repatha; she says she would like to think about this and is not  interested in this time since she is otherwise doing so well.         Dispo: RTC 6 months or sooner as needed  Signed, Caron Poser, MD

## 2024-03-09 ENCOUNTER — Ambulatory Visit

## 2024-03-09 VITALS — BP 118/72 | HR 68 | Ht 61.0 in | Wt 143.0 lb

## 2024-03-09 DIAGNOSIS — I255 Ischemic cardiomyopathy: Secondary | ICD-10-CM | POA: Diagnosis not present

## 2024-03-09 DIAGNOSIS — I5022 Chronic systolic (congestive) heart failure: Secondary | ICD-10-CM | POA: Diagnosis not present

## 2024-03-09 DIAGNOSIS — Z789 Other specified health status: Secondary | ICD-10-CM | POA: Insufficient documentation

## 2024-03-09 DIAGNOSIS — E782 Mixed hyperlipidemia: Secondary | ICD-10-CM | POA: Diagnosis not present

## 2024-03-09 DIAGNOSIS — I252 Old myocardial infarction: Secondary | ICD-10-CM | POA: Insufficient documentation

## 2024-03-09 NOTE — Patient Instructions (Signed)
 Medication Instructions:  Your physician recommends that you continue on your current medications as directed. Please refer to the Current Medication list given to you today.   If you increase 3 pounds overnight or 5 pounds in a week, recommend discussing adding a diuretic (fluid pill) to your medications.   *If you need a refill on your cardiac medications before your next appointment, please call your pharmacy*  Lab Work: No labs ordered today  If you have labs (blood work) drawn today and your tests are completely normal, you will receive your results only by: MyChart Message (if you have MyChart) OR A paper copy in the mail If you have any lab test that is abnormal or we need to change your treatment, we will call you to review the results.  Testing/Procedures: No test ordered today   Follow-Up: At [facility], you and your health needs are our priority.  As part of our continuing mission to provide you with exceptional heart care, our providers are all part of one team.  This team includes your primary Cardiologist (physician) and Advanced Practice Providers or APPs (Physician Assistants and Nurse Practitioners) who all work together to provide you with the care you need, when you need it.  Your next appointment:  6 month(s)  Provider:  Caron Poser, MD  We recommend signing up for the patient portal called MyChart.  Sign up information is provided on this After Visit Summary.  MyChart is used to connect with patients for Virtual Visits (Telemedicine).  Patients are able to view lab/test results, encounter notes, upcoming appointments, etc.  Non-urgent messages can be sent to your provider as well.   To learn more about what you can do with MyChart, go to [email or web address].

## 2024-05-13 ENCOUNTER — Other Ambulatory Visit: Payer: Self-pay | Admitting: Family Medicine

## 2024-05-13 DIAGNOSIS — E782 Mixed hyperlipidemia: Secondary | ICD-10-CM

## 2024-05-17 ENCOUNTER — Telehealth: Payer: Self-pay | Admitting: Family

## 2024-05-17 NOTE — Progress Notes (Unsigned)
 "  Advanced Heart Failure Clinic Note    PCP: Johnny Senior, MD  Primary Cardiologist: None  Chief Complaint: fatigue   HPI:  [name] is a 89 y/o female with a history of  CAD (MI 49 at age of 68), hyperlipidemia, HTN, CKD, tobacco use and chronic heart failure. Sister also had a MI.   Echo 06/23/2018: EF 40-45% along with elevated mean left atrial pressure  Was in the ED 06/28/22 due to acute on chronic heart failure. IV lasix  given w/ improvement of symptoms.   Echo 08/08/22: EF 25-30% along with mild LAE and moderate/ severe MR.   Admitted 10/28/22 due to acute onset of worsening dyspnea as well as lower extremity edema. IV diuresed and transitioned to oral diuretics with improvement of symptoms.   Echo 04/18/23: EF 35-40% with Grade I DD  Was seen in the HF clinic 12/24 and had metoprolol  succinate increased to 50mg  daily.   Seen in [facility] 05/25 and after lab results obtained, she was called and told to hold spiro for 2 days and then resume at 12.5mg  daily. Instructed to get labs repeated either with nephrology or come [facility].   PCP increased valsartan  to 240mg  daily at visit 06/25.  Seen in [facility] 08/25 and valsartan  increased to 320mg  daily.   Echo 02/02/24: EF 35-40%, mild LVH, G1DD, normal RV, mild LAE, moderate MR.   She presents today for a HF f/u visit with a chief complaint of minimal fatigue. Occasional palpitations when aggravated. Feels off balance at times. Sleeping well and has a good appetite. Has received her flu vaccine for this season.   ROS: All systems negative except as listed in HPI, PMH and Problem List.  SH:  Social History   Socioeconomic History   Marital status: Legally Separated    Spouse name: Not on file   Number of children: Not on file   Years of education: Not on file   Highest education level: 12th grade  Occupational History   Not on file  Tobacco Use   Smoking status: Former    Current packs/day: 0.00    Types: Cigarettes     Start date: 04/1998    Quit date: 02/16/1999    Years since quitting: 25.2    Passive exposure: Never   Smokeless tobacco: Never  Vaping Use   Vaping status: Never Used  Substance and Sexual Activity   Alcohol use: No   Drug use: No   Sexual activity: Not Currently    Birth control/protection: None  Other Topics Concern   Not on file  Social History Narrative   Not on file   Social Drivers of Health   Tobacco Use: Medium Risk (03/09/2024)   Patient History    Smoking Tobacco Use: Former    Smokeless Tobacco Use: Never    Passive Exposure: Never  Physicist, Medical Strain: Low Risk (07/14/2023)   Overall Financial Resource Strain (CARDIA)    Difficulty of Paying Living Expenses: Not very hard  Food Insecurity: Unknown (07/14/2023)   Hunger Vital Sign    Worried About Running Out of Food in the Last Year: Never true    Ran Out of Food in the Last Year: Patient declined  Transportation Needs: No Transportation Needs (07/14/2023)   PRAPARE - Administrator, Civil Service (Medical): No    Lack of Transportation (Non-Medical): No  Physical Activity: Insufficiently Active (07/14/2023)   Exercise Vital Sign    Days of Exercise per Week:  5 days    Minutes of Exercise per Session: 10 min  Stress: No Stress Concern Present (07/14/2023)   [employer] of Occupational Health - Occupational Stress Questionnaire    Feeling of Stress : Not at all  Social Connections: Moderately Integrated (07/14/2023)   Social Connection and Isolation Panel    Frequency of Communication with Friends and Family: More than three times a week    Frequency of Social Gatherings with Friends and Family: Three times a week    Attends Religious Services: More than 4 times per year    Active Member of Clubs or Organizations: Yes    Attends Banker Meetings: More than 4 times per year    Marital Status: Widowed  Intimate Partner Violence: Not At Risk (04/08/2023)   Humiliation,  Afraid, Rape, and Kick questionnaire    Fear of Current or Ex-Partner: No    Emotionally Abused: No    Physically Abused: No    Sexually Abused: No  Depression (PHQ2-9): Low Risk (06/13/2023)   Depression (PHQ2-9)    PHQ-2 Score: 0  Alcohol Screen: Low Risk (06/13/2023)   Alcohol Screen    Last Alcohol Screening Score (AUDIT): 0  Housing: Low Risk (07/14/2023)   Housing Stability Vital Sign    Unable to Pay for Housing in the Last Year: No    Number of Times Moved in the Last Year: 0    Homeless in the Last Year: No  Utilities: Not At Risk (06/13/2023)   AHC Utilities    Threatened with loss of utilities: No  Health Literacy: Adequate Health Literacy (06/13/2023)   B1300 Health Literacy    Frequency of need for help with medical instructions: Never    FH:  Family History  Problem Relation Age of Onset   Coronary artery disease Other    Heart disease Mother     Past Medical History:  Diagnosis Date   Allergy 1995   CAD (coronary artery disease)    stress test 08-15-08 showing lateral scar but no reversible ischemia    CHF (congestive heart failure) (HCC)    Hyperlipidemia    Hypertension    Myocardial infarction [facility])    Renal insufficiency     Current Outpatient Medications  Medication Sig Dispense Refill   aspirin  EC 81 MG tablet Take 81 mg by mouth at bedtime. (Patient taking differently: Take 81 mg by mouth daily as needed.)     colchicine  0.6 MG tablet Take 1 tablet (0.6 mg total) by mouth every 6 (six) hours as needed (gout flares). 60 tablet 2   FARXIGA  10 MG TABS tablet TAKE 1 TABLET(10 MG) BY MOUTH DAILY BEFORE BREAKFAST 30 tablet 6   metoprolol  succinate (TOPROL -XL) 50 MG 24 hr tablet Take 1 tablet (50 mg total) by mouth daily. Take with or immediately following a meal. 90 tablet 3   omeprazole  (PRILOSEC) 40 MG capsule TAKE 1 CAPSULE(40 MG) BY MOUTH DAILY 30 capsule 11   pravastatin  (PRAVACHOL ) 40 MG tablet TAKE 1 TABLET(40 MG) BY MOUTH DAILY 30 tablet 11    rosuvastatin  (CRESTOR ) 20 MG tablet Take 1 tablet (20 mg total) by mouth daily. (Patient not taking: Reported on 03/09/2024) 30 tablet 3   spironolactone  (ALDACTONE ) 25 MG tablet Take 25 mg by mouth daily.     valsartan  (DIOVAN ) 320 MG tablet Take 1 tablet (320 mg total) by mouth daily. 30 tablet 5   No current facility-administered medications for this visit.   There were no vitals filed  for this visit.  Wt Readings from Last 3 Encounters:  03/09/24 143 lb (64.9 kg)  02/18/24 142 lb (64.4 kg)  12/03/23 142 lb (64.4 kg)   Lab Results  Component Value Date   CREATININE 1.57 (H) 02/18/2024   CREATININE 1.64 (H) 12/10/2023   CREATININE 1.60 (H) 12/03/2023    EKG: not done   PHYSICAL EXAM:  General: Well appearing.  Cor: No JVD. Regular rhythm, rate.  Lungs: clear Abdomen: soft, nontender, nondistended. Extremities: no edema Neuro:. Affect pleasant    ASSESSMENT & PLAN:  1: NICM with reduced ejection fraction- - etiology likely HTN - NYHA class II - euvolemic - weight stable from last visit here 2 months ago - Echo 06/23/2018: EF 40-45% along with elevated mean left atrial pressure - Echo 08/08/22: EF 25-30% along with mild LAE and moderate/ severe MR.  - Echo 04/18/23: EF 35-40% with Grade I DD - Echo 02/02/24: EF 35-40%, mild LVH, G1DD, normal RV, mild LAE, moderate MR. - adds salt on occasion to foods like potatoes/ tomatoes; still likes to eat out at [employer] and plans to continue - continue farxiga  10mg  daily - continue metoprolol  succinate 50mg  daily  - continue furosemide  20mg  daily PRN; emphasized weighing daily and taking furosemide  if gains >2 pounds overnight - continue valsartan  320mg  daily - continue spironolactone  25mg  daily - discussed doing cMRI to evaluate for amyloid and cardiac viability. MI was 1991. She says that she gets claustrophobic easily so explained that we could give valium  prior to cMRI. Order was placed but then patient changed her mind  and wanted procedure cancelled. Prescription was shredded by nurse.  - BNP 10/28/22 was 926.5  2: HTN w/ CKD- - BP 128/80 - saw PCP [facility]) 06/25 - BMP 12/10/23 reviewed: sodium 140, potassium 4.4, creatinine 1.64 and GFR 30 - BMET today  3: Carotid stenosis- - right ICA stenosis measuring 40 to 59%, left without stenosis.  - saw vascular ([facility]) 02/24 - AAA 3.2 cm  4: Hyperlipidemia- - lipid panel 07/15/23: LDL 152, cholesterol 233 - continue pravastatin  40mg  daily (started 03/25) - lipid panel today  5: CAD- - MI 1991 at the age of 36 - [facility] cardiology referral placed today   Return in 3 months, sooner if needed.   I spent 30 minutes reviewing records, interviewing/ examing patient and managing plan/ orders.    Ellouise DELENA Class, FNP 05/17/24  "

## 2024-05-17 NOTE — Telephone Encounter (Signed)
 Called to confirm/remind patient of their appointment at the Advanced Heart Failure Clinic on 05/18/24.   Appointment:   [x] Confirmed  [] Left mess   [] No answer/No voice mail  [] VM Full/unable to leave message  [] Phone not in service  Patient reminded to bring all medications and/or complete list.  Confirmed patient has transportation. Gave directions, instructed to utilize valet parking.

## 2024-05-18 ENCOUNTER — Other Ambulatory Visit
Admission: RE | Admit: 2024-05-18 | Discharge: 2024-05-18 | Disposition: A | Discharge status: Home / Self Care | Source: Ambulatory Visit | Attending: Family | Admitting: Family

## 2024-05-18 ENCOUNTER — Encounter: Payer: Self-pay | Admitting: Family

## 2024-05-18 ENCOUNTER — Ambulatory Visit: Payer: Self-pay | Admitting: Family

## 2024-05-18 ENCOUNTER — Telehealth: Payer: Self-pay

## 2024-05-18 ENCOUNTER — Ambulatory Visit: Admitting: Family

## 2024-05-18 VITALS — BP 159/87 | HR 66 | Wt 141.1 lb

## 2024-05-18 DIAGNOSIS — E782 Mixed hyperlipidemia: Secondary | ICD-10-CM | POA: Diagnosis not present

## 2024-05-18 DIAGNOSIS — I10 Essential (primary) hypertension: Secondary | ICD-10-CM

## 2024-05-18 DIAGNOSIS — I6522 Occlusion and stenosis of left carotid artery: Secondary | ICD-10-CM | POA: Diagnosis not present

## 2024-05-18 DIAGNOSIS — I5022 Chronic systolic (congestive) heart failure: Secondary | ICD-10-CM

## 2024-05-18 DIAGNOSIS — I2510 Atherosclerotic heart disease of native coronary artery without angina pectoris: Secondary | ICD-10-CM | POA: Diagnosis not present

## 2024-05-18 LAB — BASIC METABOLIC PANEL WITH GFR
Anion gap: 12 (ref 5–15)
BUN: 30 mg/dL — ABNORMAL HIGH (ref 8–23)
CO2: 19 mmol/L — ABNORMAL LOW (ref 22–32)
Calcium: 9.1 mg/dL (ref 8.9–10.3)
Chloride: 111 mmol/L (ref 98–111)
Creatinine, Ser: 2.17 mg/dL — ABNORMAL HIGH (ref 0.44–1.00)
GFR, Estimated: 21 mL/min — ABNORMAL LOW
Glucose, Bld: 124 mg/dL — ABNORMAL HIGH (ref 70–99)
Potassium: 4.6 mmol/L (ref 3.5–5.1)
Sodium: 143 mmol/L (ref 135–145)

## 2024-05-18 NOTE — Telephone Encounter (Signed)
 Advanced Heart Failure Patient Advocate Encounter  The patient was approved for a [employer] grant that will help cover the cost of Farxiga , Metoprolol , Spironolactone , Valsartan .  Total amount awarded, $7,500.  Effective: 04/20/2024 - 04/19/2025.  BIN [id number] PCN [id number] [id number] ID [id number]  Pharmacy provided with approval and processing information. Confirmed $0 copay for Farxiga . Patient informed via voicemail.  Rachel DEL, CPhT Rx Patient Advocate Phone: [phone]

## 2024-05-18 NOTE — Progress Notes (Signed)
 Medication Samples have been provided to the patient.  Drug name: Farxiga        Strength: 10 MG        Qty: 2  LOT: [id number]  Exp.Date: 02/26/26  Dosing instructions: Take one tablet by mouth once daily  The patient has been instructed regarding the correct time, dose, and frequency of taking this medication, including desired effects and most common side effects.   Alwin Lanigan K Soledad Budreau 9:50 AM 05/18/2024

## 2024-05-18 NOTE — Patient Instructions (Signed)
 Medication Changes:  No medication changes today. We have given you 2 weeks of farxiga  samples  Lab Work:  Go over to the [facility]. Go pass the gift shop and have your blood work completed.   We will only call you if the results are abnormal or if the provider would like to make medication changes.  No news is good news.   Special Instructions // Education:  Please keep a log of your blood pressures and bring it back with you in 1 month.  Follow-Up in: Please follow up with the Advanced Heart Failure Clinic in 1 month with Kathy Class, FNP.   Thank you for choosing [facility].    At the Advanced Heart Failure Clinic, you and your health needs are our priority. We have a designated team specialized in the treatment of Heart Failure. This Care Team includes your primary Heart Failure Specialized Cardiologist (physician), Advanced Practice Providers (APPs- Physician Assistants and Nurse Practitioners), and Pharmacist who all work together to provide you with the care you need, when you need it.   You may see any of the following providers on your designated Care Team at your next follow up:  Dr. Toribio Fuel Dr. Ezra Shuck Dr. Ria Commander Dr. Morene Brownie Kathy Class, FNP Jaun Bash, RPH-CPP  Please be sure to bring in all your medications bottles to every appointment.   Need to Contact Us :  If you have any questions or concerns before your next appointment please send us  a message through [place] or call our office at [phone].    TO LEAVE A MESSAGE FOR THE NURSE SELECT OPTION 2, PLEASE LEAVE A MESSAGE INCLUDING: YOUR NAME DATE OF BIRTH CALL BACK NUMBER REASON FOR CALL**this is important as we prioritize the call backs  YOU WILL RECEIVE A CALL BACK THE SAME DAY AS LONG AS YOU CALL BEFORE 4:00 PM

## 2024-05-19 MED ORDER — VALSARTAN 160 MG PO TABS: 160.0000 mg | ORAL_TABLET | Freq: Every day | ORAL | 1 refills | Status: AC

## 2024-05-19 MED ORDER — METOPROLOL SUCCINATE ER 100 MG PO TB24: 100.0000 mg | ORAL_TABLET | Freq: Every day | ORAL | 1 refills | Status: AC

## 2024-05-19 NOTE — Telephone Encounter (Signed)
 Spoke to pt about labs. Pt agreeable to medication changes and lab work in 2 weeks at the medical mall. Pt states she is drinking plenty of fluids, just not water. No further questions at this time.

## 2024-05-25 ENCOUNTER — Other Ambulatory Visit: Payer: Self-pay | Admitting: Family

## 2024-06-16 ENCOUNTER — Ambulatory Visit: Admitting: Family

## 2024-06-18 ENCOUNTER — Ambulatory Visit: Payer: Medicare Other

## 2024-07-27 ENCOUNTER — Ambulatory Visit
